# Patient Record
Sex: Female | Born: 1975 | Hispanic: No
Health system: Midwestern US, Community
[De-identification: ages and names within clinical notes are randomized; demographics above are authoritative.]

## PROBLEM LIST (undated history)

## (undated) ENCOUNTER — Emergency Department (HOSPITAL_COMMUNITY): Admission: EM | Payer: Medicaid Other

## (undated) DIAGNOSIS — F339 Major depressive disorder, recurrent, unspecified: Secondary | ICD-10-CM

## (undated) DIAGNOSIS — R040 Epistaxis: Secondary | ICD-10-CM

## (undated) DIAGNOSIS — E559 Vitamin D deficiency, unspecified: Secondary | ICD-10-CM

## (undated) DIAGNOSIS — R1010 Upper abdominal pain, unspecified: Secondary | ICD-10-CM

## (undated) DIAGNOSIS — B9681 Helicobacter pylori [H. pylori] as the cause of diseases classified elsewhere: Secondary | ICD-10-CM

## (undated) DIAGNOSIS — K2950 Unspecified chronic gastritis without bleeding: Secondary | ICD-10-CM

## (undated) DIAGNOSIS — R1011 Right upper quadrant pain: Secondary | ICD-10-CM

## (undated) DIAGNOSIS — K279 Peptic ulcer, site unspecified, unspecified as acute or chronic, without hemorrhage or perforation: Secondary | ICD-10-CM

## (undated) DIAGNOSIS — L309 Dermatitis, unspecified: Secondary | ICD-10-CM

## (undated) DIAGNOSIS — A159 Respiratory tuberculosis unspecified: Secondary | ICD-10-CM

---

## 2011-06-27 ENCOUNTER — Ambulatory Visit
Admission: RE | Admit: 2011-06-27 | Discharge: 2011-06-27 | Disposition: A | Discharge status: Home / Self Care | Payer: No Typology Code available for payment source | Source: Ambulatory Visit | Attending: Infectious Diseases | Admitting: Infectious Diseases

## 2011-06-27 ENCOUNTER — Other Ambulatory Visit: Payer: Self-pay | Admitting: Infectious Diseases

## 2011-06-27 DIAGNOSIS — R7611 Nonspecific reaction to tuberculin skin test without active tuberculosis: Secondary | ICD-10-CM

## 2011-09-08 ENCOUNTER — Encounter (HOSPITAL_COMMUNITY): Payer: Self-pay

## 2011-09-08 ENCOUNTER — Emergency Department (HOSPITAL_COMMUNITY)
Admission: EM | Admit: 2011-09-08 | Discharge: 2011-09-08 | Disposition: A | Discharge status: Home / Self Care | Payer: Medicaid Other | Source: Home / Self Care | Attending: Emergency Medicine | Admitting: Emergency Medicine

## 2011-09-08 DIAGNOSIS — R51 Headache: Secondary | ICD-10-CM

## 2011-09-08 MED ORDER — IBUPROFEN 600 MG PO TABS: 600.0000 mg | ORAL_TABLET | Freq: Four times a day (QID) | ORAL | Status: AC | PRN

## 2011-09-08 MED ORDER — BUTALBITAL-APAP-CAFFEINE 50-325-40 MG PO TABS: 1.0000 | ORAL_TABLET | Freq: Four times a day (QID) | ORAL | Status: AC | PRN

## 2011-09-08 NOTE — ED Notes (Signed)
Pt has headache and no appetite for one week.  Pt started unknown meds three months ago for tb.

## 2011-09-08 NOTE — Discharge Instructions (Signed)
Drink extra fluids. You need 2 liters of fluids a day. Take medication as written. You will need to see a neurologist if you do not get better. You also need to see a dentist for routine care. Below are some resources.  Go to www.goodrx.com to look up your medications. This will give you a list of where you can find your prescriptions at the most affordable prices.   RESOURCE GUIDE  Dental Problems  Look up DebtSupply.pl.asp for a schedule of the [facility]'s free dental clinics called [address] of [place]. They have clinics all around [place]. Get there early and be prepared to wait.   [facility] [address]  [place], [place] [address] [phone]  [facility] [address] [place], [place] [phone]  Patients with Medicaid: [facility] [id number]                                [phone] Phone:  [phone]                                                  Phone:  [phone]  If unable to pay or uninsured, contact:  [facility] or [facility]. to become qualified for the adult dental clinic.
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
## 2011-10-29 ENCOUNTER — Emergency Department (HOSPITAL_COMMUNITY): Payer: Medicaid Other

## 2011-10-29 ENCOUNTER — Emergency Department (HOSPITAL_COMMUNITY)
Admission: EM | Admit: 2011-10-29 | Discharge: 2011-10-29 | Disposition: A | Discharge status: Home / Self Care | Payer: Medicaid Other | Attending: Emergency Medicine | Admitting: Emergency Medicine

## 2011-10-29 ENCOUNTER — Encounter (HOSPITAL_COMMUNITY): Payer: Self-pay | Admitting: *Deleted

## 2011-10-29 DIAGNOSIS — R0789 Other chest pain: Secondary | ICD-10-CM

## 2011-10-29 DIAGNOSIS — S161XXA Strain of muscle, fascia and tendon at neck level, initial encounter: Secondary | ICD-10-CM

## 2011-10-29 DIAGNOSIS — R51 Headache: Secondary | ICD-10-CM | POA: Insufficient documentation

## 2011-10-29 DIAGNOSIS — M542 Cervicalgia: Secondary | ICD-10-CM | POA: Insufficient documentation

## 2011-10-29 DIAGNOSIS — S060X1A Concussion with loss of consciousness of 30 minutes or less, initial encounter: Secondary | ICD-10-CM | POA: Insufficient documentation

## 2011-10-29 DIAGNOSIS — S139XXA Sprain of joints and ligaments of unspecified parts of neck, initial encounter: Secondary | ICD-10-CM | POA: Insufficient documentation

## 2011-10-29 DIAGNOSIS — S060X9A Concussion with loss of consciousness of unspecified duration, initial encounter: Secondary | ICD-10-CM

## 2011-10-29 DIAGNOSIS — R112 Nausea with vomiting, unspecified: Secondary | ICD-10-CM | POA: Insufficient documentation

## 2011-10-29 DIAGNOSIS — R079 Chest pain, unspecified: Secondary | ICD-10-CM | POA: Insufficient documentation

## 2011-10-29 DIAGNOSIS — M79602 Pain in left arm: Secondary | ICD-10-CM

## 2011-10-29 DIAGNOSIS — M25519 Pain in unspecified shoulder: Secondary | ICD-10-CM | POA: Insufficient documentation

## 2011-10-29 DIAGNOSIS — R42 Dizziness and giddiness: Secondary | ICD-10-CM | POA: Insufficient documentation

## 2011-10-29 DIAGNOSIS — Z8611 Personal history of tuberculosis: Secondary | ICD-10-CM | POA: Insufficient documentation

## 2011-10-29 MED ORDER — HYDROCODONE-ACETAMINOPHEN 5-325 MG PO TABS: 1.0000 | ORAL_TABLET | ORAL | Status: AC | PRN

## 2011-10-29 MED ORDER — CYCLOBENZAPRINE HCL 5 MG PO TABS: 5.0000 mg | ORAL_TABLET | Freq: Three times a day (TID) | ORAL | Status: AC | PRN

## 2011-10-29 MED ORDER — CYCLOBENZAPRINE HCL 10 MG PO TABS
5.0000 mg | ORAL_TABLET | Freq: Once | ORAL | Status: AC
  Administered 2011-10-29: 5 mg via ORAL
  Filled 2011-10-29: qty 1

## 2011-10-29 MED ORDER — HYDROCODONE-ACETAMINOPHEN 5-325 MG PO TABS
1.0000 | ORAL_TABLET | Freq: Once | ORAL | Status: AC
Start: 2011-10-29 — End: 2011-10-29
  Administered 2011-10-29: 1 via ORAL
  Filled 2011-10-29: qty 1
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
## 2011-10-29 NOTE — ED Notes (Signed)
Headache sorethroat vomiting with some chest congestion since yesterday.  The pt speaks little english.  She speaks [place]
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
## 2011-10-29 NOTE — ED Notes (Signed)
Pt d/c home in nad. Pt and husband voiced understanding of d/c instructions and med admin. Instructed not to drive on meds
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
## 2011-10-30 NOTE — ED Provider Notes (Signed)
Medical screening examination/treatment/procedure(s) were performed by non-physician practitioner and as supervising physician I was immediately available for consultation/collaboration.   Joya Gaskins, MD 10/30/11 [phone]

## 2011-11-16 ENCOUNTER — Encounter (HOSPITAL_COMMUNITY): Payer: Self-pay | Admitting: Emergency Medicine

## 2011-11-16 ENCOUNTER — Emergency Department (HOSPITAL_COMMUNITY): Payer: Medicaid Other

## 2011-11-16 ENCOUNTER — Inpatient Hospital Stay (HOSPITAL_COMMUNITY): Payer: Medicaid Other

## 2011-11-16 ENCOUNTER — Inpatient Hospital Stay (HOSPITAL_COMMUNITY)
Admission: EM | Admit: 2011-11-16 | Discharge: 2011-11-17 | DRG: 149 | Disposition: A | Discharge status: Home / Self Care | Payer: Medicaid Other | Attending: Internal Medicine | Admitting: Internal Medicine

## 2011-11-16 DIAGNOSIS — G9389 Other specified disorders of brain: Secondary | ICD-10-CM | POA: Diagnosis present

## 2011-11-16 DIAGNOSIS — R7611 Nonspecific reaction to tuberculin skin test without active tuberculosis: Secondary | ICD-10-CM | POA: Diagnosis present

## 2011-11-16 DIAGNOSIS — E872 Acidosis, unspecified: Secondary | ICD-10-CM | POA: Diagnosis present

## 2011-11-16 DIAGNOSIS — Z79899 Other long term (current) drug therapy: Secondary | ICD-10-CM

## 2011-11-16 DIAGNOSIS — R630 Anorexia: Secondary | ICD-10-CM | POA: Diagnosis present

## 2011-11-16 DIAGNOSIS — R4182 Altered mental status, unspecified: Secondary | ICD-10-CM | POA: Diagnosis present

## 2011-11-16 DIAGNOSIS — R5383 Other fatigue: Secondary | ICD-10-CM | POA: Diagnosis present

## 2011-11-16 DIAGNOSIS — R51 Headache: Secondary | ICD-10-CM | POA: Diagnosis present

## 2011-11-16 DIAGNOSIS — T3691XA Poisoning by unspecified systemic antibiotic, accidental (unintentional), initial encounter: Secondary | ICD-10-CM

## 2011-11-16 DIAGNOSIS — R519 Headache, unspecified: Secondary | ICD-10-CM | POA: Diagnosis present

## 2011-11-16 DIAGNOSIS — T365X5A Adverse effect of aminoglycosides, initial encounter: Secondary | ICD-10-CM | POA: Diagnosis present

## 2011-11-16 DIAGNOSIS — IMO0002 Reserved for concepts with insufficient information to code with codable children: Secondary | ICD-10-CM

## 2011-11-16 DIAGNOSIS — E860 Dehydration: Secondary | ICD-10-CM

## 2011-11-16 DIAGNOSIS — R42 Dizziness and giddiness: Secondary | ICD-10-CM

## 2011-11-16 DIAGNOSIS — R809 Proteinuria, unspecified: Secondary | ICD-10-CM | POA: Diagnosis present

## 2011-11-16 HISTORY — DX: Respiratory tuberculosis unspecified: A15.9

## 2011-11-16 LAB — URINALYSIS, ROUTINE W REFLEX MICROSCOPIC
Glucose, UA: NEGATIVE mg/dL
Hgb urine dipstick: NEGATIVE
Specific Gravity, Urine: 1.038 — ABNORMAL HIGH (ref 1.005–1.030)

## 2011-11-16 LAB — POCT I-STAT, CHEM 8
BUN: 24 mg/dL — ABNORMAL HIGH (ref 6–23)
Calcium, Ion: 0.95 mmol/L — ABNORMAL LOW (ref 1.12–1.32)
Chloride: 103 mEq/L (ref 96–112)
Glucose, Bld: 98 mg/dL (ref 70–99)
TCO2: 28 mmol/L (ref 0–100)

## 2011-11-16 LAB — COMPREHENSIVE METABOLIC PANEL
AST: 17 U/L (ref 0–37)
Albumin: 3.6 g/dL (ref 3.5–5.2)
BUN: 21 mg/dL (ref 6–23)
Calcium: 9.1 mg/dL (ref 8.4–10.5)
Chloride: 103 mEq/L (ref 96–112)
Creatinine, Ser: 0.26 mg/dL — ABNORMAL LOW (ref 0.50–1.10)
Total Protein: 7 g/dL (ref 6.0–8.3)

## 2011-11-16 LAB — FIBRINOGEN: Fibrinogen: 226 mg/dL (ref 204–475)

## 2011-11-16 LAB — RAPID URINE DRUG SCREEN, HOSP PERFORMED
Barbiturates: POSITIVE — AB
Benzodiazepines: NOT DETECTED
Cocaine: NOT DETECTED
Tetrahydrocannabinol: NOT DETECTED

## 2011-11-16 LAB — DIFFERENTIAL
Basophils Absolute: 0 10*3/uL (ref 0.0–0.1)
Basophils Relative: 0 % (ref 0–1)
Eosinophils Absolute: 0 10*3/uL (ref 0.0–0.7)
Eosinophils Relative: 0 % (ref 0–5)
Monocytes Absolute: 1.1 10*3/uL — ABNORMAL HIGH (ref 0.1–1.0)
Neutro Abs: 10.8 10*3/uL — ABNORMAL HIGH (ref 1.7–7.7)

## 2011-11-16 LAB — PROTIME-INR
INR: 1.12 (ref 0.00–1.49)
Prothrombin Time: 13.8 seconds (ref 11.6–15.2)
Prothrombin Time: 14.6 seconds (ref 11.6–15.2)

## 2011-11-16 LAB — URINE MICROSCOPIC-ADD ON

## 2011-11-16 LAB — CBC
HCT: 41.3 % (ref 36.0–46.0)
MCH: 30.2 pg (ref 26.0–34.0)
MCHC: 33.7 g/dL (ref 30.0–36.0)
RDW: 12.3 % (ref 11.5–15.5)

## 2011-11-16 LAB — ACETAMINOPHEN LEVEL: Acetaminophen (Tylenol), Serum: <15 ug/mL (ref 10–30)

## 2011-11-16 LAB — LACTIC ACID, PLASMA: Lactic Acid, Venous: 1.2 mmol/L (ref 0.5–2.2)

## 2011-11-16 MED ORDER — NALOXONE HCL 0.4 MG/ML IJ SOLN
0.2000 mg | Freq: Once | INTRAMUSCULAR | Status: AC
  Administered 2011-11-16: 0.2 mg via INTRAVENOUS
  Filled 2011-11-16: qty 1

## 2011-11-16 MED ORDER — SODIUM CHLORIDE 0.9 % IJ SOLN
3.0000 mL | Freq: Two times a day (BID) | INTRAMUSCULAR | Status: DC
  Administered 2011-11-16 – 2011-11-17 (×2): 3 mL via INTRAVENOUS

## 2011-11-16 MED ORDER — FAMOTIDINE 20 MG PO TABS
20.0000 mg | ORAL_TABLET | ORAL | Status: AC
  Administered 2011-11-16: 20 mg via ORAL
  Filled 2011-11-16: qty 1

## 2011-11-16 MED ORDER — IBUPROFEN 800 MG PO TABS: 400.0000 mg | ORAL_TABLET | Freq: Four times a day (QID) | ORAL | Status: DC | PRN

## 2011-11-16 MED ORDER — ONDANSETRON HCL 4 MG PO TABS: 4.0000 mg | ORAL_TABLET | Freq: Four times a day (QID) | ORAL | Status: DC | PRN

## 2011-11-16 MED ORDER — HYDROCODONE-ACETAMINOPHEN 5-325 MG PO TABS: 1.0000 | ORAL_TABLET | ORAL | Status: DC | PRN

## 2011-11-16 MED ORDER — ONDANSETRON HCL 4 MG/2ML IJ SOLN: 4.0000 mg | Freq: Four times a day (QID) | INTRAMUSCULAR | Status: DC | PRN

## 2011-11-16 MED ORDER — GUAIFENESIN-DM 100-10 MG/5ML PO SYRP: 5.0000 mL | ORAL_SOLUTION | ORAL | Status: DC | PRN

## 2011-11-16 MED ORDER — SODIUM CHLORIDE 0.9 % IV SOLN
INTRAVENOUS | Status: DC
  Administered 2011-11-16: 15:00:00 via INTRAVENOUS

## 2011-11-16 MED ORDER — DIPHENHYDRAMINE HCL 50 MG/ML IJ SOLN
12.5000 mg | INTRAMUSCULAR | Status: AC
  Administered 2011-11-16: 12.5 mg via INTRAVENOUS
  Filled 2011-11-16: qty 1

## 2011-11-16 MED ORDER — DEXTROSE 5 % IV SOLN
1.0000 g | INTRAVENOUS | Status: DC
  Administered 2011-11-16: 1 g via INTRAVENOUS
  Filled 2011-11-16 (×2): qty 10

## 2011-11-16 MED ORDER — SODIUM CHLORIDE 0.9 % IV BOLUS (SEPSIS)
500.0000 mL | Freq: Once | INTRAVENOUS | Status: AC
  Administered 2011-11-16: 500 mL via INTRAVENOUS

## 2011-11-16 MED ORDER — ALBUTEROL SULFATE (5 MG/ML) 0.5% IN NEBU: 2.5000 mg | INHALATION_SOLUTION | RESPIRATORY_TRACT | Status: DC | PRN

## 2011-11-16 NOTE — Progress Notes (Signed)
Patient complaining of itching on the chest and arm area. Paged Md.
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
## 2011-11-16 NOTE — ED Notes (Signed)
MD at bedside. 
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
## 2011-11-16 NOTE — ED Notes (Signed)
Pt returned from CT by stretcher with tech.

## 2011-11-16 NOTE — H&P (Addendum)
[facility]                                                                                    Patient Demographics  Grace Manning, is a 36 y.o. female  CSN: [id number]  MRN: [id number]  DOB - 1975/09/20  Admit Date - 11/16/2011  Outpatient Primary MD for the patient is No primary provider on file.   With History of -  Past Medical History  Diagnosis Date  . TB (tuberculosis)       History reviewed. No pertinent past surgical history.  in for   Chief Complaint  Patient presents with  . Altered Mental Status     HPI  Grace Manning  is a 36 y.o. 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   In the ER patient's blood work was suggestive of mild leukocytosis, possible UTI, mildly elevated total bilirubin, starvation ketosis, clinical dehydration, some proteinuria, her urine drug screen was positive for opioids and benzodiazepines, he did say that for her headaches she had gone to the outpatient clinic and was given some medications, however she denies trying to hurt herself in any way or overdosing on any medication. Her chest x-ray was clear and her EKG was unremarkable. She denies any photophobia or neck stiffness.    Review of Systems    In addition to the HPI above,  No Fever-chills, No Headache, No changes  with Vision or hearing, No problems swallowing food or Liquids, No Chest pain, Cough or Shortness of Breath, No Abdominal pain, No Nausea or Vommitting, Bowel movements are regular, No Blood in stool or Urine, No dysuria, No new skin rashes or bruises, No new joints pains-aches,  No new weakness, tingling, numbness in any extremity, No recent weight gain or loss, No polyuria, polydypsia or polyphagia, No significant Mental Stressors.  A full 10 point Review of Systems was done, except as stated above, all other Review of Systems were negative.   Social History History  Substance Use Topics  . Smoking status: Never Smoker   . Smokeless tobacco: Not on file  . Alcohol Use: No     Family History No history of coronary artery disease  Prior to Admission medications   Medication Sig Start Date End Date Taking? Authorizing Provider  rifampin (RIFADIN) 300 MG capsule Take 300 mg by mouth 2 (two) times daily.   Yes Historical Provider, MD    No Known Allergies  Physical Exam  Vitals  Blood pressure 99/62, pulse 94, temperature 98 F (36.7 C), temperature source Rectal, resp. rate 20, last menstrual period 10/16/2011, SpO2 99.00%.   1. General Young Asian female lying in bed looking dehydrated and weak  2. Normal affect and insight, Not Suicidal or Homicidal, Awake Alert, Oriented *3.  3. No F.N deficits, ALL C.Nerves Intact, Strength 5/5 all 4 extremities, Sensation intact all 4 extremities, Plantars down going.  4. Ears and Eyes appear Normal, Conjunctivae clear, PERRLA. Moist Oral Mucosa.  5. Supple Neck, No JVD, No cervical lymphadenopathy appriciated, No Carotid Bruits.  6. Symmetrical Chest wall movement, Good air movement bilaterally, CTAB.  7. RRR, No Gallops, Rubs or Murmurs, No Parasternal Heave.  8. Positive Bowel Sounds, Abdomen Soft, Non tender, No organomegaly appriciated, No rebound -guarding or rigidity.  9.  No Cyanosis, reduced Skin Turgor, No Skin  Rash or Bruise.  10. Good muscle tone,  joints appear normal , no effusions, Normal ROM.  11. No Palpable Lymph Nodes in Neck or Axillae   12. Has Foley catheter in place with orange color urine in it.   Data Review  CBC  Lab 11/16/11 1041 11/16/11 1000  WBC -- 12.2*  HGB 15.3* 13.9  HCT 45.0 41.3  PLT -- 137*  MCV -- 89.8  MCH -- 30.2  MCHC -- 33.7  RDW -- 12.3  LYMPHSABS -- 0.4*  MONOABS -- 1.1*  EOSABS -- 0.0  BASOSABS -- 0.0  BANDABS -- --   ------------------------------------------------------------------------------------------------------------------  Chemistries   Lab 11/16/11 1041 11/16/11 1000  NA 141 138  K 3.8 3.5  CL 103 103  CO2 -- 27  GLUCOSE 98 100*  BUN 24* 21  CREATININE 0.70 0.26*  CALCIUM -- 9.1  MG -- --  AST -- 17  ALT -- 12  ALKPHOS -- 46  BILITOT -- 1.6*   ------------------------------------------------------------------------------------------------------------------ CrCl is unknown because there is no height on file for the current visit. ------------------------------------------------------------------------------------------------------------------ No results found for this basename: TSH,T4TOTAL,FREET3,T3FREE,THYROIDAB in the last 72 hours   Coagulation profile  Lab 11/16/11 1000  INR 1.04  PROTIME --   ------------------------------------------------------------------------------------------------------------------- No results found for this basename: DDIMER:2 in the last 72 hours -------------------------------------------------------------------------------------------------------------------  Cardiac Enzymes No results found for this basename: CK:3,CKMB:3,TROPONINI:3,MYOGLOBIN:3 in the last 168 hours ------------------------------------------------------------------------------------------------------------------ No components found with this basename:  POCBNP:3   ---------------------------------------------------------------------------------------------------------------  Urinalysis    Component Value Date/Time   COLORURINE RED* 11/16/2011 1148   APPEARANCEUR CLOUDY* 11/16/2011 1148   LABSPEC 1.038* 11/16/2011 1148   PHURINE 6.0 11/16/2011 1148   GLUCOSEU NEGATIVE 11/16/2011 1148   HGBUR NEGATIVE 11/16/2011 1148   BILIRUBINUR MODERATE* 11/16/2011 1148   KETONESUR 40* 11/16/2011 1148   PROTEINUR >300* 11/16/2011 1148   UROBILINOGEN 1.0 11/16/2011 1148   NITRITE POSITIVE* 11/16/2011 1148   LEUKOCYTESUR LARGE* 11/16/2011 1148     Imaging results:   Ct Head Wo Contrast  11/16/2011  *RADIOLOGY REPORT*  Clinical Data: Difficult to arouse.  Jaundice.  CT HEAD WITHOUT CONTRAST  Technique:  Contiguous axial images were obtained from the base of the skull through the vertex without contrast.  Comparison: None.  Findings: No mass effect, midline shift, or acute intracranial hemorrhage.  Ventricles system and extraaxial space are within normal limits. There are scattered supratentorial gray matter brain parenchymal calcifications without associated mass or hemorrhage. Mastoid air cells are clear.  Visualized paranasal sinuses are clear.  IMPRESSION: No acute intracranial pathology.  Scattered brain parenchymal calcifications in the gray  matter as described.  Differential diagnosis includes prior infectious process, prior hemorrhage with subsequent calcifications, multiple occult vascular malformations.   MRI can be performed to further delineate as clinically indicated.  Original Report Authenticated By: Donavan Burnet, M.D.   Dg Chest Port 1 View  11/16/2011  *RADIOLOGY REPORT*  Clinical Data: Altered mental status.  PORTABLE CHEST - 1 VIEW  Comparison: None  Findings: The cardiomediastinal silhouette is unremarkable. The lungs are clear. There is no evidence of focal airspace disease, pulmonary edema, suspicious pulmonary nodule/mass, pleural effusion, or  pneumothorax. No acute bony abnormalities are identified.  IMPRESSION: No evidence of active cardiopulmonary disease.  Original Report Authenticated By: Rosendo Gros, M.D.    My personal review of EKG: Rhythm NSR, Rate 85 /min, no Acute ST changes    Assessment & Plan   1. Generalized weakness, dizziness, fatigue, anorexia - with starvation ketosis, all likely due to rifampin toxicity, discussed her case with Dr. Darlina Sicilian infectious disease physician, at this time patient's rifampin will be held, she is already received this for 3 months and per Dr. Maurice March she does not need any further treatment, also I confirmed with him patient does not require any isolation as she was PPD positive and never had positive chest x-rays cough or night sweats etc, she will be hydrated with IV fluids, monitored on telemetry, she will get empiric antibiotics for possible UTI, we'll request PT to see her while she is in the hospital.    2. Chronic headaches- they are essentially unchanged, patient does not have any neck stiffness or photophobia, will give her some NSAIDs for headache on a when necessary basis then outpatient followup for chronic headaches.    3. Mildly elevated total bilirubin- will check direct and indirect fraction, likely due to rifampin, other liver enzymes are stable, will repeat CMP in the morning, patient does not have any right upper quadrant tenderness.    4.Protenuria - repeat UA in a few days, may need outpt Renal followup.    5.Non specific Ct brain findings will get MRI and follow.    DVT Prophylaxis  SCDs    AM Labs Ordered, also please review Full Orders  Admission, patients condition and plan of care including tests being ordered have been discussed with the patient and brother who indicate understanding and agree with the plan and Code Status.  Code Status Full  Condition Marinell Blight K M.D on 11/16/2011 at 2:31 PM  Between 7am to 7pm - Pager -  [phone]  After 7pm go to [email or web address] - password [email or web address]  And look for the night coverage person covering me after hours  [facility] Office  [phone]

## 2011-11-16 NOTE — ED Notes (Signed)
Portable chest xray in progress.

## 2011-11-16 NOTE — Progress Notes (Signed)
Unable to insert foley due to patient modesty and language barrier and active menses.

## 2011-11-16 NOTE — ED Notes (Signed)
[id number]<AV> Expected date:11/16/11<BR> Expected time: 9:18 AM<BR> Means of arrival:Ambulance<BR> Comments:<BR> AMS

## 2011-11-16 NOTE — ED Notes (Signed)
Patient transported to CT 

## 2011-11-17 ENCOUNTER — Inpatient Hospital Stay (HOSPITAL_COMMUNITY): Payer: Medicaid Other

## 2011-11-17 DIAGNOSIS — R42 Dizziness and giddiness: Secondary | ICD-10-CM

## 2011-11-17 DIAGNOSIS — E860 Dehydration: Secondary | ICD-10-CM

## 2011-11-17 DIAGNOSIS — T3691XA Poisoning by unspecified systemic antibiotic, accidental (unintentional), initial encounter: Secondary | ICD-10-CM

## 2011-11-17 LAB — URINALYSIS, ROUTINE W REFLEX MICROSCOPIC
Glucose, UA: NEGATIVE mg/dL
Ketones, ur: 15 mg/dL — AB
Protein, ur: NEGATIVE mg/dL
pH: 7 (ref 5.0–8.0)

## 2011-11-17 LAB — COMPREHENSIVE METABOLIC PANEL
Albumin: 2.8 g/dL — ABNORMAL LOW (ref 3.5–5.2)
Alkaline Phosphatase: 42 U/L (ref 39–117)
BUN: 12 mg/dL (ref 6–23)
Creatinine, Ser: 0.3 mg/dL — ABNORMAL LOW (ref 0.50–1.10)
GFR calc Af Amer: >90 mL/min (ref >90)
Glucose, Bld: 97 mg/dL (ref 70–99)
Total Bilirubin: 4.7 mg/dL — ABNORMAL HIGH (ref 0.3–1.2)
Total Protein: 5.6 g/dL — ABNORMAL LOW (ref 6.0–8.3)

## 2011-11-17 LAB — CBC
HCT: 34.7 % — ABNORMAL LOW (ref 36.0–46.0)
Hemoglobin: 11.6 g/dL — ABNORMAL LOW (ref 12.0–15.0)
WBC: 8.1 10*3/uL (ref 4.0–10.5)

## 2011-11-17 LAB — URINE CULTURE
Colony Count: NO GROWTH
Culture: NO GROWTH

## 2011-11-17 LAB — URINE MICROSCOPIC-ADD ON

## 2011-11-17 MED ORDER — CIPROFLOXACIN HCL 500 MG PO TABS
500.0000 mg | ORAL_TABLET | Freq: Two times a day (BID) | ORAL | Status: DC
  Administered 2011-11-17: 500 mg via ORAL
  Filled 2011-11-17 (×3): qty 1

## 2011-11-17 MED ORDER — CIPROFLOXACIN HCL 500 MG PO TABS: 500.0000 mg | ORAL_TABLET | Freq: Two times a day (BID) | ORAL | Status: AC

## 2011-11-17 MED ORDER — SODIUM CHLORIDE 0.9 % IV SOLN
INTRAVENOUS | Status: DC
  Administered 2011-11-17: 11:00:00 via INTRAVENOUS

## 2011-11-17 MED ORDER — SODIUM CHLORIDE 0.9 % IV SOLN: INTRAVENOUS | Status: AC

## 2011-11-17 MED ORDER — DIPHENHYDRAMINE HCL 50 MG/ML IJ SOLN
25.0000 mg | Freq: Once | INTRAMUSCULAR | Status: AC
  Administered 2011-11-17: 25 mg via INTRAVENOUS
  Filled 2011-11-17: qty 1

## 2011-11-17 NOTE — Progress Notes (Signed)
Patient sitting comfortably in the chair looking much better.  States she is ready to go home.

## 2011-11-17 NOTE — Progress Notes (Signed)
CM not available today.  Spoke with Selena Batten with CM (oncall).  Suggested that patient call [facility] for free services available.  Provided patient [facility] phone number.

## 2011-11-17 NOTE — Progress Notes (Signed)
Patient has not urinated since inserting foley.  Will scan bladder.

## 2011-11-17 NOTE — Evaluation (Signed)
Physical Therapy Evaluation Patient Details Name: Grace Manning MRN: [id number] DOB: June 02, 1975 Today's Date: 11/17/2011 Time: 1335-1400 PT Time Calculation (min): 25 min  PT Assessment / Plan / Recommendation Clinical Impression  Pt presents with dizziness, dehydration and weakness.  Tolerated ambulation in hallway very well with no AD at normal gait speed.  BP remained stable with pt only having minor c/o dizziness initially.  Pt Mod I/independent with all mobility and will not require follow up therapy in acute setting or at D/C.  PT to sign off on pt.  She did state that she would like to go home and wanted to speak with MD about this.  RN notified.     PT Assessment  Patent does not need any further PT services    Follow Up Recommendations  No PT follow up    Barriers to Discharge        lEquipment Recommendations       Recommendations for Other Services     Frequency      Precautions / Restrictions Precautions Precautions: None Restrictions Weight Bearing Restrictions: No   Pertinent Vitals/Pain No pain      Mobility  Bed Mobility Bed Mobility: Supine to Sit Supine to Sit: 7: Independent Transfers Transfers: Sit to Stand;Stand to Sit Sit to Stand: 6: Modified independent (Device/Increase time);From bed;With upper extremity assist Stand to Sit: 6: Modified independent (Device/Increase time);With armrests;To chair/3-in-1;With upper extremity assist Details for Transfer Assistance: somewhat increased time Ambulation/Gait Ambulation/Gait Assistance: 7: Independent Ambulation Distance (Feet): 300 Feet Assistive device: None Gait Pattern: Within Functional Limits Gait velocity: WFL Stairs: No Wheelchair Mobility Wheelchair Mobility: No    Exercises     PT Diagnosis:    PT Problem List:   PT Treatment Interventions:     PT Goals    Visit Information  Last PT Received On: 11/17/11 Assistance Needed: +1    Subjective Data  Subjective: I want to go  home Patient Stated Goal: to return home   Prior Functioning  Home Living Additional Comments: Pt/PT language barrier and unable to obtain full history, however was able to mention that there is someone home with her.  Noted there was also friend/family in room with her at time of eval who was also unable to speak [place].  Communication Communication: Prefers language other than English (Pt from [place])    Cognition  Overall Cognitive Status: Appears within functional limits for tasks assessed/performed Arousal/Alertness: Awake/alert Orientation Level: Appears intact for tasks assessed Behavior During Session: [facility] for tasks performed Cognition - Other Comments: Difficult to assess cognition due to language barrier, but [facility] for tasks assessed.     Extremity/Trunk Assessment Right Lower Extremity Assessment RLE ROM/Strength/Tone: WFL for tasks assessed RLE Coordination: WFL - gross motor Left Lower Extremity Assessment LLE ROM/Strength/Tone: WFL for tasks assessed LLE Sensation: WFL - Light Touch LLE Coordination: WFL - gross motor Trunk Assessment Trunk Assessment: Normal   Balance    End of Session PT - End of Session Activity Tolerance: Patient tolerated treatment well Patient left: in chair;with call bell/phone within reach;with family/visitor present   Page, Meribeth Mattes 11/17/2011, 2:20 PM

## 2011-11-17 NOTE — Discharge Summary (Signed)
Grace Manning                                                                                   Grace Manning, is a 36 y.o. female  DOB 1976-05-11  MRN [id number].  Admission date:  11/16/2011  Discharge Date:  11/17/2011  Primary MD  No primary provider on file.  Admitting Physician  Leroy Sea, MD  Admission Diagnosis  Altered mental status [780.97] ALTERED MENTAL STATE  Discharge Diagnosis     Principal Problem:  *Dizziness Active Problems:  Fatigue  Generalized headaches  Anorexia  PPD positive      Past Medical History  Diagnosis Date  . TB (tuberculosis)     History reviewed. No pertinent past surgical history.   Hospital Course See H&P, Labs, Consult and Test reports for all details in brief, patient was admitted for Gen. 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 Ova and Parasite results to be followed.   She is now symptom free and wants to go home.   PCP to follow Hep panel and Stool Ova and Parasite results.   Consults  ID-Dr Maurice March  Significant Tests:  See full reports for all details     Dg Chest 2 View  11/16/2011  *RADIOLOGY REPORT*  Clinical Data: Fever, positive PPD, altered mental status and dizziness.  CHEST - 2 VIEW  Comparison: Film earlier this date  Findings: The cardiomediastinal silhouette is unremarkable. The lungs are clear. There is no evidence of focal  airspace disease, pulmonary edema, suspicious pulmonary nodule/mass, pleural effusion, or pneumothorax. No acute bony abnormalities are identified.  IMPRESSION: No evidence of active cardiopulmonary disease.  Original Report Authenticated By: Rosendo Gros, M.D.   Ct Head Wo Contrast  11/16/2011  *RADIOLOGY REPORT*  Clinical Data: Difficult to arouse.  Jaundice.  CT HEAD WITHOUT CONTRAST  Technique:  Contiguous axial images were obtained from the base of the skull through the vertex without contrast.  Comparison: None.  Findings: No mass effect, midline shift, or acute intracranial hemorrhage.  Ventricles system and extraaxial space are within normal limits. There are scattered supratentorial gray matter brain parenchymal calcifications without associated mass or hemorrhage. Mastoid air cells are clear.  Visualized paranasal sinuses are clear.  IMPRESSION: No acute intracranial pathology.  Scattered brain parenchymal calcifications in the gray matter as described.  Differential diagnosis includes prior infectious process, prior hemorrhage with subsequent calcifications, multiple occult vascular malformations.   MRI can be performed to further delineate as clinically indicated.  Original Report Authenticated By: Donavan Burnet, M.D.   Mr Brain  Wo Contrast  11/17/2011  *RADIOLOGY REPORT*  Clinical Data: Evaluate brain calcifications on CT.  History of TB  MRI HEAD WITHOUT CONTRAST  Technique:  Multiplanar, multiecho pulse sequences of the brain and surrounding structures were obtained according to standard protocol without intravenous contrast.  Comparison: CT head 11/16/2011  Findings: Small calcifications in the cerebral hemispheres on CT are difficult to see on MRI.  No surrounding edema is present in these areas on MRI.  No associated mass lesion is present.  Diffusion weighted imaging is negative for acute infarct.  No significant chronic ischemia is present.  Brainstem and cerebellum are normal.  Ventricle  size is normal.  Minimal mucosal thickening in the paranasal sinuses.  IMPRESSION: Small brain calcifications on CT are not associated with edema or mass on MRI.  These may be related to chronic infection but they do not appear to be active.  Original Report Authenticated By: Camelia Phenes, M.D.   US Abdomen Complete  11/17/2011  *RADIOLOGY REPORT*  Clinical Data:  Hyperbilirubinemia.  History of tuberculosis.  COMPLETE ABDOMINAL ULTRASOUND  Comparison:  None.  Findings:  Gallbladder:  No shadowing gallstones or echogenic sludge.  No gallbladder wall thickening or pericholecystic fluid.  Negative sonographic Murphy's sign according to the ultrasound technologist. Trace fluid between the liver and gallbladder is not felt clinically significant in the absence of gallbladder wall thickening.  Common bile duct:  Normal in caliber with maximum diameter approximating 4 mm.  Liver:  Normal size and echotexture without focal parenchymal abnormality.  Patent portal vein with hepatopetal flow.  IVC:  Patent.  Pancreas:  Normal size and echotexture without focal parenchymal abnormality.  Spleen:  Normal size and echotexture without focal parenchymal abnormality.  Right Kidney:  No hydronephrosis.  Well-preserved cortex.  No shadowing calculi.  Normal size and parenchymal echotexture without focal abnormalities.  Approximately 11.1 cm in length.  Left Kidney:  No hydronephrosis.  Well-preserved cortex.  No shadowing calculi.  Normal size and parenchymal echotexture without focal abnormalities.  Approximately 10.8 cm in length.  Abdominal aorta:  Normal in caliber throughout its visualized course in the abdomen without significant atherosclerosis.  IMPRESSION: Normal examination.  Original Report Authenticated By: Arnell Sieving, M.D.   Dg Chest Port 1 View  11/16/2011  *RADIOLOGY REPORT*  Clinical Data: Altered mental status.  PORTABLE CHEST - 1 VIEW  Comparison: None  Findings: The cardiomediastinal silhouette is  unremarkable. The lungs are clear. There is no evidence of focal airspace disease, pulmonary edema, suspicious pulmonary nodule/mass, pleural effusion, or pneumothorax. No acute bony abnormalities are identified.  IMPRESSION: No evidence of active cardiopulmonary disease.  Original Report Authenticated By: Rosendo Gros, M.D.     Today   Subjective:   Rashunda Grzelak today has no headache,no chest abdominal pain,no new weakness tingling or numbness, feels much better wants to go home today.    Objective:   Blood pressure 101/65, pulse 72, temperature 98.2 F (36.8 C), temperature source Oral, resp. rate 16, height 5' (1.524 m), weight 52.1 kg (114 lb 13.8 oz), last menstrual period 10/16/2011, SpO2 99.00%.  Intake/Output Summary (Last 24 hours) at 11/17/11 1712 Last data filed at 11/17/11 1639  Gross per 24 hour  Intake    600 ml  Output    850 ml  Net   -250 ml    Exam Awake Alert, Oriented *3, No new F.N deficits, Normal affect [place].AT,PERRAL, mild icterus Supple Neck,No JVD, No cervical lymphadenopathy appriciated.  Symmetrical Chest wall movement,  Good air movement bilaterally, CTAB RRR,No Gallops,Rubs or new Murmurs, No Parasternal Heave +ve B.Sounds, Abd Soft, Non tender, No organomegaly appriciated, No rebound -guarding or rigidity. No Cyanosis, Clubbing or edema, No new Rash or bruise  Data Review      Recent Results (from the past 240 hour(s))  CULTURE, BLOOD (ROUTINE X 2)     Status: Normal (Preliminary result)   Collection Time   11/16/11 11:05 AM      Component Value Range Status Comment   Specimen Description BLOOD RIGHT ARM   Final    Special Requests BOTTLES DRAWN AEROBIC AND ANAEROBIC 5 CC EACH   Final    Culture  Setup Time [id number]   Final    Culture     Final    Value:        BLOOD CULTURE RECEIVED NO GROWTH TO DATE CULTURE WILL BE HELD FOR 5 DAYS BEFORE ISSUING A FINAL NEGATIVE REPORT   Report Status PENDING   Incomplete   CULTURE, BLOOD (ROUTINE X 2)      Status: Normal (Preliminary result)   Collection Time   11/16/11 11:12 AM      Component Value Range Status Comment   Specimen Description BLOOD LEFT ARM   Final    Special Requests BOTTLES DRAWN AEROBIC AND ANAEROBIC 5 CC EACH   Final    Culture  Setup Time [id number]   Final    Culture     Final    Value:        BLOOD CULTURE RECEIVED NO GROWTH TO DATE CULTURE WILL BE HELD FOR 5 DAYS BEFORE ISSUING A FINAL NEGATIVE REPORT   Report Status PENDING   Incomplete   URINE CULTURE     Status: Normal   Collection Time   11/16/11 11:48 AM      Component Value Range Status Comment   Specimen Description URINE, CATHETERIZED   Final    Special Requests NONE   Final    Culture  Setup Time [id number]   Final    Colony Count NO GROWTH   Final    Culture NO GROWTH   Final    Report Status 11/17/2011 FINAL   Final      CBC w Diff: Lab Results  Component Value Date   WBC 8.1 11/17/2011   HGB 11.6* 11/17/2011   HCT 34.7* 11/17/2011   PLT 123* 11/17/2011   LYMPHOPCT 3* 11/16/2011   MONOPCT 9 11/16/2011   EOSPCT 0 11/16/2011   BASOPCT 0 11/16/2011    CMP: Lab Results  Component Value Date   NA 138 11/17/2011   K 3.8 11/17/2011   CL 106 11/17/2011   CO2 26 11/17/2011   BUN 12 11/17/2011   CREATININE 0.30* 11/17/2011   PROT 5.6* 11/17/2011   ALBUMIN 2.8* 11/17/2011   BILITOT 4.7* 11/17/2011   ALKPHOS 42 11/17/2011   AST 15 11/17/2011   ALT 10 11/17/2011  .   Discharge Instructions     Follow with Primary MD  in 7 days , follow your Hepatitis Panel results and Stool Ova and Parasite results.  Get CBC, CMP, checked 7 days by Primary MD and again as instructed by your Primary MD. Get a 2 view Chest X ray done next visit if you had Pneumonia of Lung problems at the Hospital.  Get Medicines reviewed and adjusted.  Please request your Prim.MD to go over all Hospital Tests and Procedure/Radiological results at the follow up, please get all Hospital records sent to  your Prim MD by signing hospital  release before you go home.  Activity: As tolerated with Full fall precautions use walker/cane & assistance as needed  Diet: Heart Healthy  Disposition Home    If you experience worsening of your admission symptoms, develop shortness of breath, life threatening emergency, suicidal or homicidal thoughts you must seek medical attention immediately by calling 911 or calling your MD immediately  if symptoms less severe.  You Must read complete instructions/literature along with all the possible adverse reactions/side effects for all the Medicines you take and that have been prescribed to you. Take any new Medicines after you have completely understood and accpet all the possible adverse reactions/side effects.   Do not drive if your were admitted for syncope or siezures until you have seen by Primary MD or a Neurologist and advised to drive.  Do not drive when taking Pain medications.   Do not take more than prescribed Pain, Sleep and Anxiety Medications  Special Instructions: If you have smoked or chewed Tobacco  in the last 2 yrs please stop smoking, stop any regular Alcohol  and or any Recreational drug use.  Wear Seat belts while driving. Follow-up Information    Follow up with Your Primary Care MD. Schedule an appointment as soon as possible for a visit in 1 day.         Discharge Medications    Naiyah, Klostermann  Home Medication Instructions ZOX:[id number]   Printed on:11/17/11 1712  Medication Information                    ciprofloxacin (CIPRO) 500 MG tablet Take 1 tablet (500 mg total) by mouth 2 (two) times daily.              Total Time in preparing paper work, data evaluation and todays exam - 35 minutes  Leroy Sea M.D on 11/17/2011 at 5:12 PM  [facility]  [phone]

## 2011-11-17 NOTE — Progress Notes (Signed)
[facility]                                                                                Patient Demographics  Grace Manning, is a 36 y.o. female  RUE:[id number]  JYN:[id number]  DOB - 01/15/1976  Admit date - 11/16/2011  Admitting Physician Leroy Sea, MD  Outpatient Primary MD for the patient is No primary provider on file.  LOS - 1   Chief Complaint  Patient presents with  . Altered Mental Status        Subjective:   Grace Manning today has, No headache, No chest pain, No abdominal pain - No Nausea, No new weakness tingling or numbness, No Cough - SOB.    Objective:   Filed Vitals:   11/16/11 1657 11/16/11 1752 11/16/11 2130 11/17/11 0535  BP: 90/60 103/70 92/62 93/61   Pulse:  99 89 70  Temp:  99.2 F (37.3 C) 97.9 F (36.6 C) 97.7 F (36.5 C)  TempSrc:  Axillary Oral Oral  Resp: 14 20 16 16   Height:  5' (1.524 m)    Weight:  52.1 kg (114 lb 13.8 oz)    SpO2: 98% 97% 97% 98%    Wt Readings from Last 3 Encounters:  11/16/11 52.1 kg (114 lb 13.8 oz)     Intake/Output Summary (Last 24 hours) at 11/17/11 1046 Last data filed at 11/17/11 0100  Gross per 24 hour  Intake    240 ml  Output    600 ml  Net   -360 ml    Exam Awake Alert, Oriented *3, No new F.N deficits, Normal affect [place].AT,PERRAL, +ve icterus  Supple Neck,No JVD, No cervical lymphadenopathy appriciated.  Symmetrical Chest wall movement, Good air movement bilaterally, CTAB RRR,No Gallops,Rubs or new Murmurs, No Parasternal Heave +ve B.Sounds, Abd Soft, Non tender, No organomegaly appriciated, No rebound -guarding or rigidity. No Cyanosis, Clubbing or edema, No new Rash or bruise , L index finger ring ++ tight with some distal swelling  Data Review  CBC  Lab 11/17/11 0525 11/16/11 1041 11/16/11 1000  WBC 8.1 -- 12.2*  HGB 11.6* 15.3* 13.9  HCT 34.7* 45.0 41.3  PLT 123* -- 137*  MCV 89.7 -- 89.8  MCH 30.0 -- 30.2  MCHC 33.4 -- 33.7  RDW 12.5 -- 12.3  LYMPHSABS  -- -- 0.4*  MONOABS -- -- 1.1*  EOSABS -- -- 0.0  BASOSABS -- -- 0.0  BANDABS -- -- --    Chemistries   Lab 11/17/11 0525 11/16/11 1516 11/16/11 1041 11/16/11 1000  NA 138 -- 141 138  K 3.8 -- 3.8 3.5  CL 106 -- 103 103  CO2 26 -- -- 27  GLUCOSE 97 -- 98 100*  BUN 12 -- 24* 21  CREATININE 0.30* -- 0.70 0.26*  CALCIUM 8.2* -- -- 9.1  MG -- -- -- --  AST 15 -- -- 17  ALT 10 -- -- 12  ALKPHOS 42 -- -- 46  BILITOT 4.7* 2.5* -- 1.6*   ------------------------------------------------------------------------------------------------------------------ estimated creatinine clearance is 69.2 ml/min (by C-G formula based on Cr of 0.3). ------------------------------------------------------------------------------------------------------------------ No results found for this basename: HGBA1C:2 in the last 72 hours ------------------------------------------------------------------------------------------------------------------  No results found for this basename: CHOL:2,HDL:2,LDLCALC:2,TRIG:2,CHOLHDL:2,LDLDIRECT:2 in the last 72 hours ------------------------------------------------------------------------------------------------------------------  [facility] 11/16/11 1823  TSH 3.159  T4TOTAL --  T3FREE --  THYROIDAB --   ------------------------------------------------------------------------------------------------------------------ No results found for this basename: VITAMINB12:2,FOLATE:2,FERRITIN:2,TIBC:2,IRON:2,RETICCTPCT:2 in the last 72 hours  Coagulation profile  Lab 11/16/11 1823 11/16/11 1000  INR 1.12 1.04  PROTIME -- --    No results found for this basename: DDIMER:2 in the last 72 hours  Cardiac Enzymes No results found for this basename: CK:3,CKMB:3,TROPONINI:3,MYOGLOBIN:3 in the last 168 hours ------------------------------------------------------------------------------------------------------------------ No components found with this basename: POCBNP:3  Micro  Results Recent Results (from the past 240 hour(s))  CULTURE, BLOOD (ROUTINE X 2)     Status: Normal (Preliminary result)   Collection Time   11/16/11 11:05 AM      Component Value Range Status Comment   Specimen Description BLOOD RIGHT ARM   Final    Special Requests BOTTLES DRAWN AEROBIC AND ANAEROBIC 5 CC EACH   Final    Culture  Setup Time [id number]   Final    Culture     Final    Value:        BLOOD CULTURE RECEIVED NO GROWTH TO DATE CULTURE WILL BE HELD FOR 5 DAYS BEFORE ISSUING A FINAL NEGATIVE REPORT   Report Status PENDING   Incomplete   CULTURE, BLOOD (ROUTINE X 2)     Status: Normal (Preliminary result)   Collection Time   11/16/11 11:12 AM      Component Value Range Status Comment   Specimen Description BLOOD LEFT ARM   Final    Special Requests BOTTLES DRAWN AEROBIC AND ANAEROBIC 5 CC EACH   Final    Culture  Setup Time [id number]   Final    Culture     Final    Value:        BLOOD CULTURE RECEIVED NO GROWTH TO DATE CULTURE WILL BE HELD FOR 5 DAYS BEFORE ISSUING A FINAL NEGATIVE REPORT   Report Status PENDING   Incomplete     Radiology Reports Dg Chest 2 View  11/16/2011  *RADIOLOGY REPORT*  Clinical Data: Fever, positive PPD, altered mental status and dizziness.  CHEST - 2 VIEW  Comparison: Film earlier this date  Findings: The cardiomediastinal silhouette is unremarkable. The lungs are clear. There is no evidence of focal airspace disease, pulmonary edema, suspicious pulmonary nodule/mass, pleural effusion, or pneumothorax. No acute bony abnormalities are identified.  IMPRESSION: No evidence of active cardiopulmonary disease.  Original Report Authenticated By: Rosendo Gros, M.D.   Ct Head Wo Contrast  11/16/2011  *RADIOLOGY REPORT*  Clinical Data: Difficult to arouse.  Jaundice.  CT HEAD WITHOUT CONTRAST  Technique:  Contiguous axial images were obtained from the base of the skull through the vertex without contrast.  Comparison: None.  Findings: No mass effect, midline  shift, or acute intracranial hemorrhage.  Ventricles system and extraaxial space are within normal limits. There are scattered supratentorial gray matter brain parenchymal calcifications without associated mass or hemorrhage. Mastoid air cells are clear.  Visualized paranasal sinuses are clear.  IMPRESSION: No acute intracranial pathology.  Scattered brain parenchymal calcifications in the gray matter as described.  Differential diagnosis includes prior infectious process, prior hemorrhage with subsequent calcifications, multiple occult vascular malformations.   MRI can be performed to further delineate as clinically indicated.  Original Report Authenticated By: Donavan Burnet, M.D.   Mr Brain Wo Contrast  11/17/2011  *RADIOLOGY REPORT*  Clinical Data: Evaluate brain calcifications on  CT.  History of TB  MRI HEAD WITHOUT CONTRAST  Technique:  Multiplanar, multiecho pulse sequences of the brain and surrounding structures were obtained according to standard protocol without intravenous contrast.  Comparison: CT head 11/16/2011  Findings: Small calcifications in the cerebral hemispheres on CT are difficult to see on MRI.  No surrounding edema is present in these areas on MRI.  No associated mass lesion is present.  Diffusion weighted imaging is negative for acute infarct.  No significant chronic ischemia is present.  Brainstem and cerebellum are normal.  Ventricle size is normal.  Minimal mucosal thickening in the paranasal sinuses.  IMPRESSION: Small brain calcifications on CT are not associated with edema or mass on MRI.  These may be related to chronic infection but they do not appear to be active.  Original Report Authenticated By: Camelia Phenes, M.D.   US Abdomen Complete  11/17/2011  *RADIOLOGY REPORT*  Clinical Data:  Hyperbilirubinemia.  History of tuberculosis.  COMPLETE ABDOMINAL ULTRASOUND  Comparison:  None.  Findings:  Gallbladder:  No shadowing gallstones or echogenic sludge.  No gallbladder wall  thickening or pericholecystic fluid.  Negative sonographic Murphy's sign according to the ultrasound technologist. Trace fluid between the liver and gallbladder is not felt clinically significant in the absence of gallbladder wall thickening.  Common bile duct:  Normal in caliber with maximum diameter approximating 4 mm.  Liver:  Normal size and echotexture without focal parenchymal abnormality.  Patent portal vein with hepatopetal flow.  IVC:  Patent.  Pancreas:  Normal size and echotexture without focal parenchymal abnormality.  Spleen:  Normal size and echotexture without focal parenchymal abnormality.  Right Kidney:  No hydronephrosis.  Well-preserved cortex.  No shadowing calculi.  Normal size and parenchymal echotexture without focal abnormalities.  Approximately 11.1 cm in length.  Left Kidney:  No hydronephrosis.  Well-preserved cortex.  No shadowing calculi.  Normal size and parenchymal echotexture without focal abnormalities.  Approximately 10.8 cm in length.  Abdominal aorta:  Normal in caliber throughout its visualized course in the abdomen without significant atherosclerosis.  IMPRESSION: Normal examination.  Original Report Authenticated By: Arnell Sieving, M.D.   Dg Chest Port 1 View  11/16/2011  *RADIOLOGY REPORT*  Clinical Data: Altered mental status.  PORTABLE CHEST - 1 VIEW  Comparison: None  Findings: The cardiomediastinal silhouette is unremarkable. The lungs are clear. There is no evidence of focal airspace disease, pulmonary edema, suspicious pulmonary nodule/mass, pleural effusion, or pneumothorax. No acute bony abnormalities are identified.  IMPRESSION: No evidence of active cardiopulmonary disease.  Original Report Authenticated By: Rosendo Gros, M.D.    Scheduled Meds:   . ciprofloxacin  500 mg Oral BID  . diphenhydrAMINE  12.5 mg Intravenous NOW  . diphenhydrAMINE  25 mg Intravenous Once  . famotidine  20 mg Oral NOW  . naloxone (NARCAN) injection  0.2 mg Intravenous Once   . sodium chloride  500 mL Intravenous Once  . sodium chloride  3 mL Intravenous Q12H  . DISCONTD: cefTRIAXone (ROCEPHIN)  IV  1 g Intravenous Q24H   Continuous Infusions:   . sodium chloride 75 mL/hr at 11/17/11 1100  . DISCONTD: sodium chloride 125 mL/hr at 11/17/11 0700  . DISCONTD: sodium chloride 75 mL/hr at 11/17/11 1032   PRN Meds:.albuterol, guaiFENesin-dextromethorphan, HYDROcodone-acetaminophen, ondansetron (ZOFRAN) IV, ondansetron, DISCONTD: ibuprofen  Assessment & Plan    1. Generalized weakness, dizziness, fatigue, anorexia - with starvation ketosis, all likely due to rifampin toxicity - she feels better with IVF, Rifampin  on Hold, PT to see, have D/W Dr Maurice March ID who will see the pt for any further input ( non specific MRI findings ? Taenia solium, future Rx for +ve PPD).    2. Chronic headaches- they are essentially unchanged, patient does not have any neck stiffness or photophobia-  Feels better, MRI noted, D/W Dr Maurice March who will see the pt, outpt Neuro follow, stool Ova and Parasite ordered for ? Taenia solium. .     3. Mildly elevated total bilirubin- D/W GI Dr Matthias Hughs likelt Gilbert's syndrome TB high to to illness now, [place] stable, Indirect fraction is high, DIC workup -ve.     4.Protenuria - repeat UA , may need outpt Renal followup.       DVT Prophylaxis  SCD  Procedures CT and MRI Brain  Consults ID   Susa Raring K M.D on 11/17/2011 at 10:46 AM  Between 7am to 7pm - Pager - [phone]  After 7pm go to [email or web address] - password [email or web address]  And look for the night coverage person covering for me after hours  [facility] Office  [phone]

## 2011-11-17 NOTE — Discharge Instructions (Signed)
Follow with Primary MD  in 7 days , follow your Hepatitis Panel results & Stool Ova and Parasite results.  Get CBC, CMP, checked 7 days by Primary MD and again as instructed by your Primary MD. Get a 2 view Chest X ray done next visit if you had Pneumonia of Lung problems at the Hospital.  Get Medicines reviewed and adjusted.  Please request your Prim.MD to go over all Hospital Tests and Procedure/Radiological results at the follow up, please get all Hospital records sent to your Prim MD by signing hospital release before you go home.  Activity: As tolerated with Full fall precautions use walker/cane & assistance as needed  Diet: Heart Healthy  Disposition Home    If you experience worsening of your admission symptoms, develop shortness of breath, life threatening emergency, suicidal or homicidal thoughts you must seek medical attention immediately by calling 911 or calling your MD immediately  if symptoms less severe.  You Must read complete instructions/literature along with all the possible adverse reactions/side effects for all the Medicines you take and that have been prescribed to you. Take any new Medicines after you have completely understood and accpet all the possible adverse reactions/side effects.   Do not drive if your were admitted for syncope or siezures until you have seen by Primary MD or a Neurologist and advised to drive.  Do not drive when taking Pain medications.   Do not take more than prescribed Pain, Sleep and Anxiety Medications  Special Instructions: If you have smoked or chewed Tobacco  in the last 2 yrs please stop smoking, stop any regular Alcohol  and or any Recreational drug use.  Wear Seat belts while driving.

## 2011-11-18 LAB — HEPATITIS PANEL, ACUTE
HCV Ab: NEGATIVE
Hepatitis B Surface Ag: NEGATIVE

## 2011-11-19 ENCOUNTER — Encounter (HOSPITAL_COMMUNITY): Payer: Self-pay | Admitting: *Deleted

## 2011-11-22 LAB — CULTURE, BLOOD (ROUTINE X 2)
Culture  Setup Time: 201306221725
Culture: NO GROWTH
Culture: NO GROWTH

## 2012-09-04 ENCOUNTER — Encounter (HOSPITAL_COMMUNITY): Payer: Self-pay | Admitting: *Deleted

## 2012-09-04 ENCOUNTER — Emergency Department (HOSPITAL_COMMUNITY)
Admission: EM | Admit: 2012-09-04 | Discharge: 2012-09-05 | Disposition: A | Discharge status: Home / Self Care | Payer: Medicaid Other | Attending: Emergency Medicine | Admitting: Emergency Medicine

## 2012-09-04 DIAGNOSIS — K0889 Other specified disorders of teeth and supporting structures: Secondary | ICD-10-CM

## 2012-09-04 DIAGNOSIS — K089 Disorder of teeth and supporting structures, unspecified: Secondary | ICD-10-CM | POA: Insufficient documentation

## 2012-09-04 DIAGNOSIS — Z8611 Personal history of tuberculosis: Secondary | ICD-10-CM | POA: Insufficient documentation

## 2012-09-04 DIAGNOSIS — K029 Dental caries, unspecified: Secondary | ICD-10-CM | POA: Insufficient documentation

## 2012-09-04 NOTE — ED Notes (Signed)
Toothache - left side, lower, back tooth. Started today.

## 2012-09-05 MED ORDER — OXYCODONE-ACETAMINOPHEN 5-325 MG PO TABS
2.0000 | ORAL_TABLET | Freq: Once | ORAL | Status: AC
  Administered 2012-09-05: 2 via ORAL
  Filled 2012-09-05: qty 2

## 2012-09-05 MED ORDER — PENICILLIN V POTASSIUM 500 MG PO TABS: 500.0000 mg | ORAL_TABLET | Freq: Three times a day (TID) | ORAL | Status: AC

## 2012-09-05 MED ORDER — ONDANSETRON 4 MG PO TBDP
4.0000 mg | ORAL_TABLET | Freq: Once | ORAL | Status: DC
  Filled 2012-09-05: qty 1

## 2012-09-05 MED ORDER — HYDROCODONE-ACETAMINOPHEN 5-325 MG PO TABS: 1.0000 | ORAL_TABLET | ORAL | Status: DC | PRN

## 2012-09-05 NOTE — ED Provider Notes (Signed)
History     CSN: [id number]  Arrival date & time 09/04/12  2338   First MD Initiated Contact with Patient 09/05/12 0004      Chief Complaint  Patient presents with  . Dental Pain    (Consider location/radiation/quality/duration/timing/severity/associated sxs/prior treatment) Patient is a 37 y.o. female presenting with tooth pain. The history is provided by the patient. The history is limited by a language barrier. A language interpreter was used.  Dental PainThe primary symptoms include mouth pain. Primary symptoms do not include dental injury, oral bleeding, oral lesions, headaches, fever, shortness of breath, sore throat, angioedema or cough. The symptoms began 12 to 24 hours ago. The symptoms are unchanged. The symptoms are recurrent. The symptoms occur constantly.  Additional symptoms include: dental sensitivity to temperature and jaw pain. Additional symptoms do not include: gum swelling, gum tenderness, purulent gums, trismus, facial swelling, trouble swallowing, pain with swallowing, excessive salivation, dry mouth, taste disturbance, smell disturbance, drooling, ear pain, hearing loss, nosebleeds, swollen glands, goiter and fatigue.    Past Medical History  Diagnosis Date  . Tuberculosis of lung, nodular   . TB (tuberculosis)     History reviewed. No pertinent past surgical history.  No family history on file.  History  Substance Use Topics  . Smoking status: Never Smoker   . Smokeless tobacco: Not on file  . Alcohol Use: No    OB History   Grav Para Term Preterm Abortions TAB SAB Ect Mult Living                  Review of Systems  Constitutional: Negative for fever, appetite change and fatigue.  HENT: Negative for hearing loss, ear pain, nosebleeds, sore throat, facial swelling, drooling and trouble swallowing.   Respiratory: Negative for cough and shortness of breath.   Gastrointestinal: Negative for nausea and vomiting.  Neurological: Negative for headaches.     Allergies  Rocephin  Home Medications  No current outpatient prescriptions on file.  BP 126/75  Pulse 58  Temp(Src) 98 F (36.7 C) (Oral)  Resp 14  SpO2 100%  LMP 08/28/2012  Physical Exam  Constitutional: She is oriented to person, place, and time. She appears well-developed and well-nourished. No distress.  HENT:  Head: Normocephalic and atraumatic. No trismus in the jaw.  Mouth/Throat: Uvula is midline. She does not have dentures. No oral lesions. Abnormal dentition. Dental caries present. No dental abscesses, edematous or lacerations.  Eyes: Conjunctivae are normal. No scleral icterus.  Neck: Normal range of motion.  Cardiovascular: Normal rate, regular rhythm and normal heart sounds.  Exam reveals no gallop and no friction rub.   No murmur heard. Pulmonary/Chest: Effort normal and breath sounds normal. No respiratory distress.  Abdominal: Soft. Bowel sounds are normal. She exhibits no distension and no mass. There is no tenderness. There is no guarding.  Neurological: She is alert and oriented to person, place, and time.  Skin: Skin is warm and dry. She is not diaphoretic.    ED Course  Procedures (including critical care time)  Labs Reviewed - No data to display No results found.   1. Pain, dental       MDM  12:38 AM Patient with toothache.  No gross abscess.  Exam unconcerning for Ludwig's angina or spread of infection.  Will treat with penicillin and pain medicine.  Urged patient to follow-up with dentist.   Patient given explicit instructions.  The instructions were translated into Nepali with google translate.  An english and  Nepali copy were given to the patient.  She is instructed to follow up with Dr. Patrcia Dolly. The patient appears reasonably screened and/or stabilized for discharge and I doubt any other medical condition or other [facility] requiring further screening, evaluation, or treatment in the ED at this time prior to discharge.         Arthor Captain, PA-C 09/05/12 0041

## 2012-09-06 NOTE — ED Provider Notes (Signed)
Medical screening examination/treatment/procedure(s) were performed by non-physician practitioner and as supervising physician I was immediately available for consultation/collaboration.  Jones Skene, M.D.     Jones Skene, MD 09/06/12 0030

## 2015-09-01 ENCOUNTER — Emergency Department (HOSPITAL_COMMUNITY)
Admission: EM | Admit: 2015-09-01 | Discharge: 2015-09-01 | Disposition: A | Discharge status: Home / Self Care | Payer: Medicaid Other | Attending: Emergency Medicine | Admitting: Emergency Medicine

## 2015-09-01 ENCOUNTER — Telehealth: Payer: Self-pay | Admitting: Surgery

## 2015-09-01 ENCOUNTER — Encounter (HOSPITAL_COMMUNITY): Payer: Self-pay | Admitting: Emergency Medicine

## 2015-09-01 DIAGNOSIS — R1013 Epigastric pain: Secondary | ICD-10-CM

## 2015-09-01 DIAGNOSIS — O219 Vomiting of pregnancy, unspecified: Secondary | ICD-10-CM | POA: Insufficient documentation

## 2015-09-01 DIAGNOSIS — Z8611 Personal history of tuberculosis: Secondary | ICD-10-CM | POA: Insufficient documentation

## 2015-09-01 DIAGNOSIS — R1084 Generalized abdominal pain: Secondary | ICD-10-CM | POA: Insufficient documentation

## 2015-09-01 DIAGNOSIS — O9989 Other specified diseases and conditions complicating pregnancy, childbirth and the puerperium: Secondary | ICD-10-CM | POA: Insufficient documentation

## 2015-09-01 LAB — CBC
HCT: 37.5 % (ref 36.0–46.0)
Hemoglobin: 12.4 g/dL (ref 12.0–15.0)
MCH: 29.6 pg (ref 26.0–34.0)
MCHC: 33.1 g/dL (ref 30.0–36.0)
MCV: 89.5 fL (ref 78.0–100.0)
PLATELETS: 165 10*3/uL (ref 150–400)
RBC: 4.19 MIL/uL (ref 3.87–5.11)
RDW: 12.6 % (ref 11.5–15.5)
WBC: 10.6 10*3/uL — AB (ref 4.0–10.5)

## 2015-09-01 LAB — URINALYSIS, ROUTINE W REFLEX MICROSCOPIC
Bilirubin Urine: NEGATIVE
GLUCOSE, UA: NEGATIVE mg/dL
Hgb urine dipstick: NEGATIVE
KETONES UR: NEGATIVE mg/dL
LEUKOCYTES UA: NEGATIVE
NITRITE: NEGATIVE
PROTEIN: NEGATIVE mg/dL
Specific Gravity, Urine: 1.017 (ref 1.005–1.030)
pH: 7.5 (ref 5.0–8.0)

## 2015-09-01 LAB — COMPREHENSIVE METABOLIC PANEL
ALT: 19 U/L (ref 14–54)
ANION GAP: 9 (ref 5–15)
AST: 30 U/L (ref 15–41)
Albumin: 3.8 g/dL (ref 3.5–5.0)
Alkaline Phosphatase: 48 U/L (ref 38–126)
BUN: 7 mg/dL (ref 6–20)
CHLORIDE: 103 mmol/L (ref 101–111)
CO2: 25 mmol/L (ref 22–32)
CREATININE: 0.59 mg/dL (ref 0.44–1.00)
Calcium: 9.7 mg/dL (ref 8.9–10.3)
GFR calc non Af Amer: >60 mL/min (ref >60)
Glucose, Bld: 104 mg/dL — ABNORMAL HIGH (ref 65–99)
POTASSIUM: 4 mmol/L (ref 3.5–5.1)
SODIUM: 137 mmol/L (ref 135–145)
TOTAL PROTEIN: 7.7 g/dL (ref 6.5–8.1)
Total Bilirubin: 0.8 mg/dL (ref 0.3–1.2)

## 2015-09-01 LAB — HCG, QUANTITATIVE, PREGNANCY: HCG, BETA CHAIN, QUANT, S: 268047 m[IU]/mL — AB (ref <5)

## 2015-09-01 LAB — LIPASE, BLOOD: LIPASE: 23 U/L (ref 11–51)

## 2015-09-01 MED ORDER — GI COCKTAIL ~~LOC~~
30.0000 mL | Freq: Once | ORAL | Status: AC
  Administered 2015-09-01: 30 mL via ORAL
  Filled 2015-09-01: qty 30

## 2015-09-01 MED ORDER — LANSOPRAZOLE 30 MG PO CPDR: 30.0000 mg | DELAYED_RELEASE_CAPSULE | Freq: Every day | ORAL | Status: DC

## 2015-09-01 NOTE — ED Notes (Signed)
Pt arrives via POv from home with n/v approx 3 days ago. Since has had periumbilical pain. Denies recent fever, hx of medical problems. VSS. Pt NAD at present.

## 2015-09-01 NOTE — ED Provider Notes (Signed)
CSN: [id number]     Arrival date & time 09/01/15  1148 History   First MD Initiated Contact with Patient 09/01/15 1316     Chief Complaint  Patient presents with  . Abdominal Pain     (Consider location/radiation/quality/duration/timing/severity/associated sxs/prior Treatment) HPI  Grace Manning is a(n) 40 y.o. female who presents for c/o abdominal pain. She speaks [place]. Translation is provided both by her daughter and professional translation services. The patient has had 3 days vomiting. Patient states that she ate a potato and was taking a shower when she suddenly became sick. She complains of epigastric abdominal pain, water brash, belching. She states that she drinks a lot of energy drinks and a lot of spicy foods. She hasn't noticed that this has specifically caused her symptoms in the past. However, she has had similar symptoms before. She states she has been unable to eat foods for the past 3 days. The patient also complains of suprapubic pain, but denies vaginal or urinary symptoms. She also denies back pain. The patient is concerned that she may possibly be pregnant pregnant and states that she does not want to have the baby and is very concerned about her family finding out about either a pregnancy oran elective abortion. Past Medical History  Diagnosis Date  . Tuberculosis of lung, nodular   . TB (tuberculosis)    History reviewed. No pertinent past surgical history. History reviewed. No pertinent family history. Social History  Substance Use Topics  . Smoking status: Never Smoker   . Smokeless tobacco: None  . Alcohol Use: No   OB History    No data available     Review of Systems  Ten systems reviewed and are negative for acute change, except as noted in the HPI.    Allergies  Review of patient's allergies indicates no known allergies.  Home Medications   Prior to Admission medications   Not on File   BP 112/81 mmHg  Pulse 64  Temp(Src) 97.5 F (36.4 C)  (Oral)  Resp 18  SpO2 100% Physical Exam  Constitutional: She is oriented to person, place, and time. She appears well-developed and well-nourished. No distress.  HENT:  Head: Normocephalic and atraumatic.  Eyes: Conjunctivae are normal. No scleral icterus.  Neck: Normal range of motion.  Cardiovascular: Normal rate, regular rhythm and normal heart sounds.  Exam reveals no gallop and no friction rub.   No murmur heard. Pulmonary/Chest: Effort normal and breath sounds normal. No respiratory distress.  Abdominal: Soft. Bowel sounds are normal. She exhibits no distension and no mass. There is no tenderness (Minimal diffuse tenderness.). There is no guarding.  Neurological: She is alert and oriented to person, place, and time.  Skin: Skin is warm and dry. She is not diaphoretic.  Nursing note and vitals reviewed.   ED Course  Procedures (including critical care time) Labs Review Labs Reviewed  COMPREHENSIVE METABOLIC PANEL - Abnormal; Notable for the following:    Glucose, Bld 104 (*)    All other components within normal limits  CBC - Abnormal; Notable for the following:    WBC 10.6 (*)    All other components within normal limits  URINALYSIS, ROUTINE W REFLEX MICROSCOPIC (NOT [facility]) - Abnormal; Notable for the following:    APPearance HAZY (*)    All other components within normal limits  LIPASE, BLOOD  HCG, QUANTITATIVE, PREGNANCY  POC URINE PREG, ED    Imaging Review No results found. I have personally reviewed and evaluated  these images and lab results as part of my medical decision-making.   EKG Interpretation None      MDM   Final diagnoses:  Epigastric pain  Pregnancy  This is a [place] female with epigastric pain and confirmed pregnancy on quantitative hCG test. Her labs show slightly elevated white blood cell count, but CMP, lipase and urine are not concerning. This patient's case is complicated by her cultural background, inability to speak [place] and  other various social barriers. The patient is married with a husband who is [place]. This pregnancy is unwanted and out of wedlock. The patient is extremely concerned about anyone in her family finding out.  She does not want to proceed with carrying the pregnancy.  The patient also has no translation services available outside of her family and friends.  I spent approximately 1 hour in both dialogue through translation services along with case management and trying to open the patient with her current needs.  We were able to establish an appointment with a clinic here in [place]. I have spoken with case manager Anola Gurney who is also trying to establish a translator who will accompany the patient, in order to provide services as well as obtaining Informed consent.  I have spoken with the patient. She will be contacted later today with a translator for her appointment times.  The patient also has a very high quantitative pregnancy. I performed a an informal bedside ultrasound with provider, nurse practitioner Maris Berger able to visualize a very small fetus with active heart rate in the uterus which appears very early in gestation aligning with the patient's reported last menstrual period of February 24. The patient will be given follow-up at the [facility] outpatient clinic for further management of her gynecologic and obstetric needs. I have also discussed birth control with the patient. She will need to follow up with the OB in the next 4 days. Because the patient's family returned to the room, I did not place pregnancy as a diagnosis in her AVS.   Arthor Captain, PA-C 09/02/15 1059  Benjiman Core, MD 09/11/15 1143

## 2015-09-01 NOTE — Telephone Encounter (Signed)
ED CM contacted patient via [employer] Nepali interpreter regarding referral, patient is requesting assistance with scheduling appt. [facility]oint[facility] scheduled [facility] Wed [facility]12 at 9a,  provided patient with appt. Information via interpreter, and [employer] number for Nepali translation.. Teach back done via interpreter, patient verbalized understanding, No further questions or concerns verbalized at this time.  No further CM needs identified.

## 2015-09-01 NOTE — Telephone Encounter (Signed)
ED CM attempted to contact patient via [employer] . Concerning assisting with a  referral for an [facility] [place] [phone].  CM was not able to contact patient CM will make second attempt later tonight, or in the am to follow up.

## 2015-09-01 NOTE — Discharge Instructions (Signed)
Please return to the ER with any new or worsening pain, vomiting, bleeding.  The case manager will call you tonight around 7 or 8 pm to give you your follow up appointment. She will call with an interpreter.   Abdominal Pain, Adult Many things can cause abdominal pain. Usually, abdominal pain is not caused by a disease and will improve without treatment. It can often be observed and treated at home. Your health care provider will do a physical exam and possibly order blood tests and X-rays to help determine the seriousness of your pain. However, in many cases, more time must pass before a clear cause of the pain can be found. Before that point, your health care provider may not know if you need more testing or further treatment. HOME CARE INSTRUCTIONS Monitor your abdominal pain for any changes. The following actions may help to alleviate any discomfort you are experiencing:  Only take over-the-counter or prescription medicines as directed by your health care provider.  Do not take laxatives unless directed to do so by your health care provider.  Try a clear liquid diet (broth, tea, or water) as directed by your health care provider. Slowly move to a bland diet as tolerated. SEEK MEDICAL CARE IF:  You have unexplained abdominal pain.  You have abdominal pain associated with nausea or diarrhea.  You have pain when you urinate or have a bowel movement.  You experience abdominal pain that wakes you in the night.  You have abdominal pain that is worsened or improved by eating food.  You have abdominal pain that is worsened with eating fatty foods.  You have a fever. SEEK IMMEDIATE MEDICAL CARE IF:  Your pain does not go away within 2 hours.  You keep throwing up (vomiting).  Your pain is felt only in portions of the abdomen, such as the right side or the left lower portion of the abdomen.  You pass bloody or black tarry stools. MAKE SURE YOU:  Understand these  instructions.  Will watch your condition.  Will get help right away if you are not doing well or get worse.   This information is not intended to replace advice given to you by your health care provider. Make sure you discuss any questions you have with your health care provider.   Document Released: 02/20/2005 Document Revised: 02/01/2015 Document Reviewed: 01/20/2013 Elsevier Interactive Patient Education [employer]

## 2015-09-15 ENCOUNTER — Telehealth: Payer: Self-pay

## 2015-09-15 NOTE — Telephone Encounter (Signed)
Pt presents to clinic today stating she appointment

## 2015-09-29 ENCOUNTER — Encounter: Payer: Medicaid Other | Admitting: Family Medicine

## 2016-02-03 ENCOUNTER — Emergency Department (HOSPITAL_COMMUNITY)
Admission: EM | Admit: 2016-02-03 | Discharge: 2016-02-03 | Disposition: A | Discharge status: Home / Self Care | Payer: No Typology Code available for payment source | Attending: Emergency Medicine | Admitting: Emergency Medicine

## 2016-02-03 ENCOUNTER — Encounter (HOSPITAL_COMMUNITY): Payer: Self-pay | Admitting: Emergency Medicine

## 2016-02-03 ENCOUNTER — Emergency Department (HOSPITAL_COMMUNITY): Payer: No Typology Code available for payment source

## 2016-02-03 DIAGNOSIS — R51 Headache: Secondary | ICD-10-CM | POA: Insufficient documentation

## 2016-02-03 DIAGNOSIS — M542 Cervicalgia: Secondary | ICD-10-CM | POA: Diagnosis present

## 2016-02-03 DIAGNOSIS — Y92410 Unspecified street and highway as the place of occurrence of the external cause: Secondary | ICD-10-CM | POA: Diagnosis not present

## 2016-02-03 DIAGNOSIS — Y939 Activity, unspecified: Secondary | ICD-10-CM | POA: Diagnosis not present

## 2016-02-03 DIAGNOSIS — M25562 Pain in left knee: Secondary | ICD-10-CM | POA: Diagnosis not present

## 2016-02-03 DIAGNOSIS — M25531 Pain in right wrist: Secondary | ICD-10-CM | POA: Diagnosis not present
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
## 2016-02-03 NOTE — Discharge Instructions (Signed)
You will hurt worse tomorrow.  ° °Take 4 over the counter ibuprofen tablets 3 times a day or 2 over-the-counter naproxen tablets twice a day for pain. °Also take tylenol 1000mg(2 extra strength) four times a day.  ° ° °

## 2016-11-07 ENCOUNTER — Ambulatory Visit (HOSPITAL_COMMUNITY)
Admission: EM | Admit: 2016-11-07 | Discharge: 2016-11-07 | Disposition: A | Discharge status: Home / Self Care | Payer: Medicaid Other | Attending: Emergency Medicine | Admitting: Emergency Medicine

## 2016-11-07 ENCOUNTER — Encounter (HOSPITAL_COMMUNITY): Payer: Self-pay | Admitting: Emergency Medicine

## 2016-11-07 DIAGNOSIS — R141 Gas pain: Secondary | ICD-10-CM | POA: Diagnosis not present

## 2016-11-07 DIAGNOSIS — R0789 Other chest pain: Secondary | ICD-10-CM | POA: Diagnosis not present

## 2016-11-07 DIAGNOSIS — K5901 Slow transit constipation: Secondary | ICD-10-CM

## 2016-11-07 MED ORDER — RANITIDINE HCL 150 MG PO CAPS: 150.0000 mg | ORAL_CAPSULE | Freq: Two times a day (BID) | ORAL | 0 refills | Status: AC

## 2016-11-07 MED ORDER — MELOXICAM 15 MG PO TABS: 15.0000 mg | ORAL_TABLET | Freq: Every day | ORAL | 0 refills | Status: DC

## 2016-11-07 NOTE — Discharge Instructions (Signed)
Take medications to help with your stomach as well as MiraLAX. Place 1 capful and 6-8 ounces of water or juice repeat this and 30 minutes and drink a third one 30 minutes later. Increase the fiber in your diet. You have pain in your chest wall which is primarily your ribs and muscles in the chest. You are prescribed a medicine to help with this.

## 2016-11-07 NOTE — ED Triage Notes (Signed)
Via Language line interpreter (Achut [id number])  Pt c/o constant abd pain onset 3 days associated w/CP and bad breath  Voices no other concerns  A&O x4... NAD... Family member at bedside

## 2016-11-07 NOTE — ED Provider Notes (Signed)
CSN: [id number]     Arrival date & time 11/07/16  1859 History   First MD Initiated Contact with Patient 11/07/16 1924     Chief Complaint  Patient presents with  . Abdominal Pain   (Consider location/radiation/quality/duration/timing/severity/associated sxs/prior Treatment) 41 year old Nepali-female accompanied by significant other with complaints concerning chest pain all over, generalized abdominal pain and bad breath for about 15 days. She states the chest pain hurt with cough and movement yet is constant. Occasionally she has shortness of breath on exertion. She states the pain feels like stabbing. She also is complaining of generalized abdominal gas and bloating. She took an unknown type of stomach pill today but did not help.  Video interpreter used.      Past Medical History:  Diagnosis Date  . TB (tuberculosis)   . Tuberculosis of lung, nodular    History reviewed. No pertinent surgical history. History reviewed. No pertinent family history. Social History  Substance Use Topics  . Smoking status: Never Smoker  . Smokeless tobacco: Never Used  . Alcohol use No   OB History    No data available     Review of Systems  Constitutional: Positive for activity change. Negative for fever.  HENT: Negative.   Respiratory: Positive for shortness of breath.   Cardiovascular: Positive for chest pain. Negative for leg swelling.  Gastrointestinal: Positive for abdominal pain and constipation. Negative for nausea and vomiting.  Genitourinary: Negative.   All other systems reviewed and are negative.   Allergies  Patient has no known allergies.  Home Medications   Prior to Admission medications   Medication Sig Start Date End Date Taking? Authorizing Provider  meloxicam (MOBIC) 15 MG tablet Take 1 tablet (15 mg total) by mouth daily. 11/07/16   Hayden RasmussenMabe, Yovany Clock, NP  ranitidine (ZANTAC) 150 MG capsule Take 1 capsule (150 mg total) by mouth 2 (two) times daily. 11/07/16   Hayden RasmussenMabe, Ammaar Encina,  NP   Meds Ordered and Administered this Visit  Medications - No data to display  BP 138/80 (BP Location: Left Arm)   Pulse 71   Temp 98.8 F (37.1 C) (Oral)   Resp 18   LMP 10/23/2016   SpO2 99%  No data found.   Physical Exam  Constitutional: She appears well-developed and well-nourished. No distress.  HENT:  Mouth/Throat: Oropharynx is clear and moist. No oropharyngeal exudate.  Eyes: EOM are normal.  Neck: Neck supple.  Cardiovascular: Normal rate, regular rhythm, normal heart sounds and intact distal pulses.   Pulmonary/Chest: Effort normal and breath sounds normal. No respiratory distress. She has no wheezes.  Generalized anterior chest wall tenderness greatest along the parasternal borders as well as the costal margins. This exacerbates the chest pain for which she presents.  Abdominal: Soft. Bowel sounds are normal. She exhibits no distension and no mass. There is no rebound and no guarding.  Various areas of tenderness to the abdomen. No rebound or guarding. Pressing on one side of the abdomen causes discomfort to the other side. There is no distention. Much of the abdomen percusses tympanic other areas are flat ordered all.  Musculoskeletal: She exhibits no edema.  Lymphadenopathy:    She has no cervical adenopathy.  Neurological: She is alert.  Skin: Skin is warm and dry.  Nursing note and vitals reviewed.   Urgent Care Course     Procedures (including critical care time)  Labs Review Labs Reviewed - No data to display  Imaging Review No results found.   Visual Acuity Review  Right Eye Distance:   Left Eye Distance:   Bilateral Distance:    Right Eye Near:   Left Eye Near:    Bilateral Near:         MDM   1. Anterior chest wall pain   2. Abdominal gas pain   3. Slow transit constipation    Take medications to help with your stomach as well as MiraLAX. Place 1 capful and 6-8 ounces of water or juice repeat this and 30 minutes and drink a  third one 30 minutes later. Increase the fiber in your diet. You have pain in your chest wall which is primarily your ribs and muscles in the chest. You are prescribed a medicine to help with this. Meds ordered this encounter  Medications  . ranitidine (ZANTAC) 150 MG capsule    Sig: Take 1 capsule (150 mg total) by mouth 2 (two) times daily.    Dispense:  30 capsule    Refill:  0    Order Specific Question:   Supervising Provider    Answer:   Eustace Moore [[id number]]  . meloxicam (MOBIC) 15 MG tablet    Sig: Take 1 tablet (15 mg total) by mouth daily.    Dispense:  14 tablet    Refill:  0    Order Specific Question:   Supervising Provider    Answer:   Eustace Moore [[id number]]       Hayden Rasmussen, NP 11/07/16 2002

## 2016-11-13 ENCOUNTER — Encounter (HOSPITAL_COMMUNITY): Payer: Self-pay | Admitting: Emergency Medicine

## 2016-11-13 ENCOUNTER — Emergency Department (HOSPITAL_COMMUNITY): Payer: Medicaid Other

## 2016-11-13 ENCOUNTER — Emergency Department (HOSPITAL_COMMUNITY)
Admission: EM | Admit: 2016-11-13 | Discharge: 2016-11-13 | Disposition: A | Discharge status: Home / Self Care | Payer: Medicaid Other | Attending: Emergency Medicine | Admitting: Emergency Medicine

## 2016-11-13 DIAGNOSIS — R1010 Upper abdominal pain, unspecified: Secondary | ICD-10-CM | POA: Insufficient documentation

## 2016-11-13 DIAGNOSIS — R0789 Other chest pain: Secondary | ICD-10-CM

## 2016-11-13 DIAGNOSIS — R196 Halitosis: Secondary | ICD-10-CM | POA: Insufficient documentation

## 2016-11-13 LAB — CBC
HEMATOCRIT: 34.3 % — AB (ref 36.0–46.0)
HEMOGLOBIN: 11.1 g/dL — AB (ref 12.0–15.0)
MCH: 29.2 pg (ref 26.0–34.0)
MCHC: 32.4 g/dL (ref 30.0–36.0)
MCV: 90.3 fL (ref 78.0–100.0)
Platelets: 144 10*3/uL — ABNORMAL LOW (ref 150–400)
RBC: 3.8 MIL/uL — ABNORMAL LOW (ref 3.87–5.11)
RDW: 12.8 % (ref 11.5–15.5)
WBC: 9 10*3/uL (ref 4.0–10.5)

## 2016-11-13 LAB — BASIC METABOLIC PANEL
ANION GAP: 5 (ref 5–15)
BUN: 12 mg/dL (ref 6–20)
CHLORIDE: 105 mmol/L (ref 101–111)
CO2: 27 mmol/L (ref 22–32)
Calcium: 9.2 mg/dL (ref 8.9–10.3)
Creatinine, Ser: 0.68 mg/dL (ref 0.44–1.00)
GFR calc Af Amer: >60 mL/min (ref >60)
GLUCOSE: 86 mg/dL (ref 65–99)
POTASSIUM: 3.7 mmol/L (ref 3.5–5.1)
SODIUM: 137 mmol/L (ref 135–145)

## 2016-11-13 LAB — I-STAT TROPONIN, ED: Troponin i, poc: 0 ng/mL (ref 0.00–0.08)

## 2016-11-13 MED ORDER — MELOXICAM 15 MG PO TABS: 15.0000 mg | ORAL_TABLET | Freq: Every day | ORAL | 0 refills | Status: AC

## 2016-11-13 MED ORDER — GI COCKTAIL ~~LOC~~
30.0000 mL | Freq: Once | ORAL | Status: AC
  Administered 2016-11-13: 30 mL via ORAL
  Filled 2016-11-13: qty 30

## 2016-11-13 MED ORDER — OMEPRAZOLE 20 MG PO CPDR: 20.0000 mg | DELAYED_RELEASE_CAPSULE | Freq: Two times a day (BID) | ORAL | 0 refills | Status: AC

## 2016-11-13 NOTE — ED Notes (Signed)
Translator used for discharge by edp, patient and family denies any questions or concerns

## 2016-11-13 NOTE — Discharge Instructions (Signed)
Taken the Prilosec twice daily before meals. You may take Mobic as needed for pain in the chest wall. You may apply ice or heat for 20 minutes 3 times daily for comfort. Follow up with a primary care physician for reevaluation. Return to the ED if any concerning signs or symptoms develop.

## 2016-11-13 NOTE — ED Notes (Signed)
Esignature not available. 

## 2016-11-13 NOTE — ED Provider Notes (Signed)
BMP and CBC unremarkable.  [facility]-EMER[facility]ENCY DEPT Provider Note   CSN: [id number] Arrival date & time: 11/13/16  1453     History   Chief Complaint Chief Complaint  Patient presents with  . Chest Pain    HPI Grace Manning is a 41 y.o. female with history of generalized headaches, fatigue, dizziness, anorexia, and TB who presents today with chief complaint gradual onset, intermittent generalized chest pain and upper abdominal pain for one week. She states that pain is burning in quality, worsened with eating. She says that while eating she feels a burning sensation going down her throat, to the abdomen. Chest pain is also exacerbated with deep breaths and cough. She endorses shortness of breath due to pain on inspiration. She was seen and evaluated for the same symptoms 6 days ago. She was sent home with Zantac for reflux and Mobic for her reproducible chest pain on palpation. She endorses reduced oral intake secondary to pain. She states she has intermittent lightheadedness, which she believes is due to reduced oral intake. She also endorses bad breath. She denies cardiac history. She denies nausea, vomiting, diarrhea, constipation, melena, hematuria, dysuria. Patient's daughter served as Nurse, learning disability. The history is provided by the patient. The history is limited by a language barrier. A language interpreter was used (Patient's daughter translated).    Past Medical History:  Diagnosis Date  . TB (tuberculosis)   . Tuberculosis of lung, nodular     Patient Active Problem List   Diagnosis Date Noted  . Dizziness 11/16/2011  . Fatigue 11/16/2011  . Generalized headaches 11/16/2011  . Anorexia 11/16/2011  . 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  OB History    No data available       Home Medications    Prior to Admission medications   Medication Sig Start Date End Date Taking? Authorizing Provider  meloxicam (MOBIC) 15 MG tablet Take 1 tablet (15 mg  total) by mouth daily. 11/13/16   Luevenia Maxin, Shontez Sermon A, PA-C  omeprazole (PRILOSEC) 20 MG capsule Take 1 capsule (20 mg total) by mouth 2 (two) times daily before a meal. 11/13/16 11/27/16  Jerelene Salaam A, PA-C  ranitidine (ZANTAC) 150 MG capsule Take 1 capsule (150 mg total) by mouth 2 (two) times daily. 11/07/16   Hayden Rasmussen, NP    Family History No family history on file.  Social History Social History  Substance Use Topics  . Smoking status: Never Smoker  . Smokeless tobacco: Never Used  . Alcohol use No     Allergies   Patient has no known allergies.   Review of Systems Review of Systems  Respiratory: Positive for shortness of breath.   Cardiovascular: Positive for chest pain.  Gastrointestinal: Positive for abdominal pain. Negative for blood in stool, constipation, diarrhea, nausea and vomiting.  Genitourinary: Negative for dysuria, flank pain and hematuria.  Neurological: Positive for light-headedness.  All other systems reviewed and are negative.    Physical Exam Updated Vital Signs BP 122/88   Pulse (!) 52   Temp 97 F (36.1 C)   Resp 18   LMP 11/09/2016 (Exact Date)   SpO2 100%   Physical Exam  Constitutional: She appears well-developed and well-nourished. No distress.  HENT:  Head: Normocephalic and atraumatic.  Eyes: Conjunctivae are normal. Right eye exhibits no discharge. Left eye exhibits no discharge.  Neck: Neck supple. No JVD present. No tracheal deviation present.  Cardiovascular: Normal rate, regular rhythm, normal heart sounds and  intact distal pulses.   No murmur heard. 2+ radial and DP/PT pulses bl, negative Homan's bl   Pulmonary/Chest: Effort normal and breath sounds normal. No respiratory distress. She exhibits tenderness.  Diffuse anterior chest wall TTP, primarily parasternally and along the costal margins. Equal rise and fall of chest, no increased work of breathing, no paradoxical wall motion  Abdominal: Soft. Bowel sounds are normal. There is  no tenderness. There is no rebound and no guarding.  Generalized upper abdominal TTP, no focal tenderness. Murphy's absent, Rovsing's absent, no TTP at McBurney's point.   Musculoskeletal: She exhibits no edema.  Neurological: She is alert.  Skin: Skin is warm and dry.  Psychiatric: She has a normal mood and affect. Her behavior is normal.  Nursing note and vitals reviewed.    ED Treatments / Results  Labs (all labs ordered are listed, but only abnormal results are displayed) Labs Reviewed  CBC - Abnormal; Notable for the following:       Result Value   RBC 3.80 (*)    Hemoglobin 11.1 (*)    HCT 34.3 (*)    Platelets 144 (*)    All other components within normal limits  BASIC METABOLIC PANEL  I-STAT TROPOININ, ED    EKG  EKG Interpretation None       Radiology Dg Chest 2 View  Result Date: 11/13/2016 CLINICAL DATA:  Chest pain EXAM: CHEST  2 VIEW COMPARISON:  Chest radiograph 02/03/2016 FINDINGS: The heart size and mediastinal contours are within normal limits. Both lungs are clear. The visualized skeletal structures are unremarkable. IMPRESSION: No active cardiopulmonary disease. Electronically Signed   By: Deatra RobinsonKevin  Herman M.D.   On: 11/13/2016 16:01    Procedures Procedures (including critical care time)  Medications Ordered in ED Medications  gi cocktail (Maalox,Lidocaine,Donnatal) (30 mLs Oral Given 11/13/16 1801)     Initial Impression / Assessment and Plan / ED Course  I have reviewed the triage vital signs and the nursing notes.  Pertinent labs & imaging results that were available during my care of the patient were reviewed by me and considered in my medical decision making (see chart for details).     Patient with ongoing complaints of halitosis, burning chest pain and epigastric pain worsening with eating. She has been evaluated in the urgent care for the same. Afebrile, vital signs are stable. Chest pain reproducible on palpation. EKG normal sinus rhythm,  troponin negative, low suspicion of ACS/MI with no risk factors. Low suspicion of PE. Pain improved significantly with GI cocktail. BMP unremarkable, no leukocytosis. Low suspicion of infectious process including appendicitis, colitis, pyelonephritis. Suspect symptoms related to GERD. 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 Explained to patient importance of taking her PPI daily, as well as follow-up with primary care for reevaluation and possible EGD for further evaluation. Discussed indications for return to the ED. Patient and patient's daughter verbalized understanding of and agreement with plan and patient is stable for discharge home at this time.  Final Clinical Impressions(s) / ED Diagnoses   Final diagnoses:  Chest wall pain  Pain of upper abdomen  Halitosis    New Prescriptions Discharge Medication List as of 11/13/2016  6:56 PM    START taking these medications   Details  omeprazole (PRILOSEC) 20 MG capsule Take 1 capsule (20 mg total) by mouth 2 (two) times daily before a meal., Starting Wed 11/13/2016, Until Wed 11/27/2016, Print         Luevenia MaxinFawze, [place] A, PA-C 11/13/16 1937  Azalia Bilis, MD 11/14/16 [phone]

## 2016-11-13 NOTE — ED Triage Notes (Signed)
Pt states 2 days of chest pain that has gotten worse. Pt speaks Nepali. Pt states burning to generalized epigastric region.
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
## 2017-04-15 ENCOUNTER — Inpatient Hospital Stay: Admit: 2017-04-15 | Discharge: 2017-04-16 | Disposition: A | Discharge status: Home / Self Care | Payer: MEDICAID

## 2017-04-15 ENCOUNTER — Emergency Department: Admit: 2017-04-15 | Payer: MEDICAID | Primary: Family

## 2017-04-15 DIAGNOSIS — R1013 Epigastric pain: Secondary | ICD-10-CM

## 2017-04-15 MED ORDER — LIDOCAINE VISCOUS 2 % MT SOLN
2 % | Freq: Once | OROMUCOSAL | Status: AC
Start: 2017-04-15 — End: 2017-04-15
  Administered 2017-04-16: 01:00:00 via ORAL

## 2017-04-15 NOTE — ED Notes (Signed)
Pt provided with discharge instructions and prescriptions X 2. Pt verbalized understanding of establishing pcp. Pt ambulatory out of the ED.      Finis BudMegan Andree Golphin, RN  04/15/17 2026

## 2017-04-15 NOTE — ED Provider Notes (Addendum)
[facility]  [facility] EMERGENCY DEPARTMENT  [address] [place] [address]  Dept: [phone]  Loc: [phone]  eMERGENCYdEPARTMENT eNCOUnter      Pt Name: Deborah Riley  MRN: [id number]  Birthdate 12-23-1975  Date of evaluation: 04/15/2017  Provider:Kinzy Weyers Dory Peru, APRN - CNP     Evaluated by the advanced practice provider    CHIEF COMPLAINT       Chief Complaint   Patient presents with   . Chest Pain     five or six months of chest pain. shortness of breath as well. worse now. denies cough. she also c/o bad breath. worse when she is laying down at bed       CRITICAL CARE TIME       HISTORY OF PRESENT ILLNESS  (Location/Symptom, Timing/Onset, Context/Setting, Quality, Duration,Modifying Factors, Severity.)   Deborah Riley is a 41 y.o. female who presents to the emergency department And waning of epigastric abdominal pain, chest tightness. Has been occurring for 5 or 6 months. Mild nausea. No vomiting. A burning sensation in the chest and the throat. Bad breath. Pain is made much worse after eating. Often eats in the evening and then lies down the bed. Pain and burning in the epigastric to the chest sensation is much worse when lying flat and trying to sleep.    Denies back pain. Denies arm pain. Denies jaw pain. Denies fever. Denies chills.  Patient is [place], does not speak [place].       Nursing Notes were reviewedand agreed with or any disagreements were addressed in the HPI.    REVIEW OF SYSTEMS    (2-9 systems for level 4, 10 or more for level 5)     Review of Systems   Constitutional: Negative.  Negative for activity change, appetite change, diaphoresis, fatigue and fever.   HENT: Negative.    Respiratory: Negative.    Cardiovascular: Positive for chest pain. Negative for palpitations and leg swelling.   Gastrointestinal: Positive for abdominal pain and nausea. Negative for vomiting.   Genitourinary: Negative.    Musculoskeletal: Negative.  Negative for back  pain.   Skin: Negative.    Neurological: Negative.        Except as noted above the remainder of the review of systems was reviewed and negative.       PAST MEDICAL HISTORY   History reviewed. No pertinent past medical history.    SURGICAL HISTORY     History reviewed. No pertinent surgical history.    CURRENT MEDICATIONS     @EDPTMEDCONT @    ALLERGIES     Patient has no known allergies.    FAMILY HISTORY     History reviewed. No pertinent family history.  No family status information on file.        SOCIAL HISTORY      reports that she has never smoked. She has never used smokeless tobacco. She reports that she does not drink alcohol or use drugs.    PHYSICAL EXAM    (up to 7 for level 4, 8 or more for level 5)     ED Triage Vitals [04/15/17 1750]   Enc Vitals Group      BP 109/69      Pulse 75      Resp 15      Temp 98.1 F (36.7 C)      Temp Source Oral      SpO2 100 %  Weight 127 lb 3.3 oz (57.7 kg)      Height 5\' 2"  (1.575 m)      Head Circumference       Peak Flow       Pain Score       Pain Loc       Pain Edu?       Excl. in GC?        Physical Exam   Constitutional: She is oriented to person, place, and time. Vital signs are normal. She appears well-developed and well-nourished. She is cooperative.  Non-toxic appearance. She does not have a sickly appearance. She does not appear ill. No distress.   HENT:   Head: Normocephalic.   Eyes: Pupils are equal, round, and reactive to light.   Cardiovascular: Normal rate and regular rhythm.    Pulmonary/Chest: Effort normal and breath sounds normal.   Abdominal: Soft. Bowel sounds are normal. She exhibits no mass. There is no tenderness. There is no guarding.   Neurological: She is alert and oriented to person, place, and time.   Skin: Skin is warm and dry. Capillary refill takes less than 2 seconds. She is not diaphoretic.   Psychiatric: She has a normal mood and affect.   Nursing note and vitals reviewed.        DIAGNOSTIC RESULTS     EKG: All EKG's are  interpreted by the Emergency Department Physician who either signs or Co-signs this chart in the absence of a cardiologist.  EKG interpreted per Dr. Katrinka BlazingSmith and reviewed per myself sinus rhythm. Possible sinus arrhythmia. There are complex. No axis deviation. No evidence of T wave inversion. No evidence of ischemia. No evidence of STEMI. Ventricular rate 75, PR interval 140, QRS 70, QT 370.  RADIOLOGY:   Non-plain film images such as CT, Ultrasound and MRI are read by the radiologist. Plain radiographic images are visualized and preliminarilyinterpreted by the emergency physician with the below findings:    Interpretation per the Radiologist below,if available at the time of this note:    XR CHEST STANDARD (2 VW)   Final Result   No acute process.               LABS:  Labs Reviewed   CBC WITH AUTO DIFFERENTIAL - Abnormal; Notable for the following:        Result Value    MPV 11.3 (*)     All other components within normal limits    Narrative:     Performed at:  [facility]  [address]  [place], [place] [address]   Phone [phone]   URINE RT REFLEX TO CULTURE - Abnormal; Notable for the following:     Clarity, UA CLOUDY (*)     All other components within normal limits    Narrative:     Performed at:  [facility]  [address]  [place], [place] [address]   Phone [phone]   MICROSCOPIC URINALYSIS - Abnormal; Notable for the following:     Bacteria, UA 1+ (*)     Epi Cells 12 (*)     All other components within normal limits    Narrative:     Performed at:  [facility]  [address]  [place], [place] [id number]   Phone [phone]   TROPONIN    Narrative:     Performed at:  [facility]  [address] [place], [place] [address]   Phone [phone]   COMPREHENSIVE METABOLIC PANEL    Narrative:     Performed at:  [facility]  [address]  [place], [place] [address]   Phone [phone]   LIPASE    Narrative:     Performed at:  [facility]  [address] [place], [place] [address]   Phone [phone]   PREGNANCY, URINE    Narrative:     Performed at:  [facility]  [address] [place], [place] [address]   Phone [phone]       All other labs were within normal range or not returned as of this dictation.    EMERGENCY DEPARTMENT COURSE and DIFFERENTIAL DIAGNOSIS/MDM:   Vitals:    Vitals:    04/15/17 1750   BP: 109/69   Pulse: 75   Resp: 15   Temp: 98.1 F (36.7 C)   TempSrc: Oral   SpO2: 100%   Weight: 127 lb 3.3 oz (57.7 kg)   Height: 5\' 2"  (1.575 m)     Medications   aluminum & magnesium hydroxide-simethicone (MAALOX) 30 mL, lidocaine viscous (XYLOCAINE) 5 mL (GI COCKTAIL) ( Oral Given 04/15/17 1932)       Interpreter service is used to obtain HPI, ROS, PE,: [place]: #[id number]      MDM  Differential diagnosis: Gastric reflux, gastritis, peptic ulcer disease, unstable angina, cholecystitis, cholelithiasis, esophagitis  Was seen and evaluated per myself. Dr. Katrinka Blazing was present and available for consultation as needed.  It is unlikely that this is cardiac in origin and she has had no symptoms that have progressed or worsens. It's been pretty steady and constant with burning epigastric pain that radiates into the chest, difficult to take a deep breath, bad breath, worsening pain when lying flat after eating. And the symptoms have been going on for 5 or 6 months.  She denies any known cardiac history.    1945: Bedside reevaluation. Patient resting comfortably. Smiling. No distress. Reports some relief of pain and discomfort after the GI cocktail.    All laboratory studies are unremarkable with no abnormality. No evidence of cholecystitis, no evidence of GI bleeding. Troponin and EKG and chest x-ray are unremarkable.  Feel that this is probably GI in origin. She can be discharged  home with an internal medicine referral for follow-up. I'll start her on omeprazole and Carafate. I did discuss all of the discharge instructions disposition of diagnostic studies with the patient. She verbalizes understanding and she is discharged home in good condition.  CONSULTS:  None    PROCEDURES:  Procedures    FINAL IMPRESSION      1. Abdominal pain, epigastric    2. Dyspepsia          DISPOSITION/PLAN   @EDDISPO @    PATIENT REFERRED TO:  [facility]  [phone]  Schedule an appointment as soon as possible for a visit in 3 days        DISCHARGE MEDICATIONS:  New Prescriptions    OMEPRAZOLE (PRILOSEC) 20 MG DELAYED RELEASE CAPSULE    Take 1 capsule by mouth 2 times daily (before meals)    SUCRALFATE (CARAFATE) 1 GM/10ML SUSPENSION    Take 10 mLs by mouth 4 times daily       (Please note that portions of this note were completed with a  voice recognition program.  Efforts were made to edit the dictations but occasionally words are mis-transcribed.)    Raydell Maners Dory PeruSue Ysabelle Goodroe, APRN - CNP          Edman Circleelinda S Hykeem Ojeda, APRN - CNP  04/15/17 1957       Edman CircleDelinda S Zael Shuman, APRN - CNP  04/15/17 1958

## 2017-04-16 LAB — CBC WITH AUTO DIFFERENTIAL
Basophils %: 0.5 %
Basophils Absolute: 0 10*3/uL (ref 0.0–0.2)
Eosinophils %: 1.9 %
Eosinophils Absolute: 0.1 10*3/uL (ref 0.0–0.6)
Hematocrit: 38.2 % (ref 36.0–48.0)
Hemoglobin: 12.3 g/dL (ref 12.0–16.0)
Lymphocytes %: 21.3 %
Lymphocytes Absolute: 1.4 10*3/uL (ref 1.0–5.1)
MCH: 29.6 pg (ref 26.0–34.0)
MCHC: 32.2 g/dL (ref 31.0–36.0)
MCV: 91.8 fL (ref 80.0–100.0)
MPV: 11.3 fL — ABNORMAL HIGH (ref 5.0–10.5)
Monocytes %: 12.5 %
Monocytes Absolute: 0.8 10*3/uL (ref 0.0–1.3)
Neutrophils %: 63.8 %
Neutrophils Absolute: 4.2 10*3/uL (ref 1.7–7.7)
Platelets: 154 10*3/uL (ref 135–450)
RBC: 4.16 M/uL (ref 4.00–5.20)
RDW: 13.1 % (ref 12.4–15.4)
WBC: 6.5 10*3/uL (ref 4.0–11.0)

## 2017-04-16 LAB — COMPREHENSIVE METABOLIC PANEL
ALT: 18 U/L (ref 10–40)
AST: 21 U/L (ref 15–37)
Albumin/Globulin Ratio: 1.1 (ref 1.1–2.2)
Albumin: 4 g/dL (ref 3.4–5.0)
Alkaline Phosphatase: 54 U/L (ref 40–129)
Anion Gap: 11 (ref 3–16)
BUN: 11 mg/dL (ref 7–20)
CO2: 26 mmol/L (ref 21–32)
Calcium: 9.1 mg/dL (ref 8.3–10.6)
Chloride: 102 mmol/L (ref 99–110)
Creatinine: 0.6 mg/dL (ref 0.6–1.1)
GFR African American: >60 (ref >60)
GFR Non-African American: >60 (ref >60)
Globulin: 3.6 g/dL
Glucose: 91 mg/dL (ref 70–99)
Potassium: 4 mmol/L (ref 3.5–5.1)
Sodium: 139 mmol/L (ref 136–145)
Total Bilirubin: 0.6 mg/dL (ref 0.0–1.0)
Total Protein: 7.6 g/dL (ref 6.4–8.2)

## 2017-04-16 LAB — URINALYSIS WITH REFLEX TO CULTURE
Bilirubin Urine: NEGATIVE
Blood, Urine: NEGATIVE
Glucose, Ur: NEGATIVE mg/dL
Ketones, Urine: NEGATIVE mg/dL
Leukocyte Esterase, Urine: NEGATIVE
Nitrite, Urine: NEGATIVE
Protein, UA: NEGATIVE mg/dL
Specific Gravity, UA: 1.01 (ref 1.005–1.030)
Urobilinogen, Urine: 0.2 E.U./dL (ref <2.0)
pH, UA: 6.5 (ref 5.0–8.0)

## 2017-04-16 LAB — PREGNANCY, URINE: HCG(Urine) Pregnancy Test: NEGATIVE

## 2017-04-16 LAB — MICROSCOPIC URINALYSIS
Epithelial Cells, UA: 12 /HPF — ABNORMAL HIGH (ref 0–5)
Hyaline Casts, UA: 1 /LPF (ref 0–8)
RBC, UA: 1 /HPF (ref 0–4)
WBC, UA: 4 /HPF (ref 0–5)

## 2017-04-16 LAB — LIPASE: Lipase: 37 U/L (ref 13.0–60.0)

## 2017-04-16 LAB — TROPONIN: Troponin: <0.01 ng/mL (ref <0.01)

## 2017-04-16 MED ORDER — SUCRALFATE 1 GM/10ML PO SUSP
1 GM/0ML | Freq: Four times a day (QID) | ORAL | 3 refills | Status: DC
Start: 2017-04-16 — End: 2017-06-09

## 2017-04-16 MED ORDER — OMEPRAZOLE 20 MG PO CPDR
20 MG | ORAL_CAPSULE | Freq: Two times a day (BID) | ORAL | 0 refills | Status: DC
Start: 2017-04-16 — End: 2017-05-09

## 2017-04-16 MED FILL — MAG-AL PLUS 200-200-20 MG/5ML PO LIQD: 200-200-20 MG/5ML | ORAL | Qty: 30

## 2017-05-03 ENCOUNTER — Emergency Department: Admit: 2017-05-04 | Payer: MEDICAID | Primary: Family

## 2017-05-03 DIAGNOSIS — R0789 Other chest pain: Secondary | ICD-10-CM

## 2017-05-03 NOTE — ED Provider Notes (Signed)
[facility] EMERGENCY DEPARTMENT  eMERGENCY dEPARTMENT eNCOUnter      Pt Name: Deborah Riley  VWU:[id number]  Birthdate 05-22-1976  Date of evaluation: 05/03/2017  Provider: Sharlotte Alamo, PA-C   ED attending physician: Hazle Nordmann, M.D.      This patient was seen and evaluated by the supervising physician Dr. Hazle Nordmann, M.D.      Chief Complaint:    Chief Complaint   Patient presents with   . Chest Pain     Pt brought in by ems for chest pain, sore throat and generally not feeling well for 2 weeks. Pt given 324 asa in squad       Nursing Notes, Past Medical Hx, Past Surgical Hx, Social Hx, Allergies, and Family Hx were all reviewed and agreed with or any disagreements were addressed in the HPI.    HPI:  (Location, Duration, Timing, Severity,Quality, Assoc Sx, Context, Modifying factors)  This is a  41 y.o. female who complain of cough, congestion, headaches, generalized body aches and fever for over 2 weeks. Her primary care physician called her an antibiotic but she left it at home and does not know the name of it. No nausea vomiting. Denies abdominal pain, no diarrhea constipation. Does complain of some weakness. No other complaints. No history of hypertension or diabetes. States she is not on any other medication. She denies having any allergies. No history of asthma or COPD.      PastMedical/Surgical History:  History reviewed. No pertinent past medical history.  History reviewed. No pertinent surgical history.    Medications:  Previous Medications    OMEPRAZOLE (PRILOSEC) 20 MG DELAYED RELEASE CAPSULE    Take 1 capsule by mouth 2 times daily (before meals)    SUCRALFATE (CARAFATE) 1 GM/10ML SUSPENSION    Take 10 mLs by mouth 4 times daily         Review of Systems:  Review of Systems   Constitutional: Negative for chills and fever.   HENT: Positive for congestion and sore throat. Negative for facial swelling.    Eyes: Negative for discharge and redness.   Respiratory: Positive for  cough. Negative for apnea, choking and shortness of breath.    Cardiovascular: Positive for chest pain.   Gastrointestinal: Negative for abdominal pain, nausea and vomiting.   Genitourinary: Negative for dysuria.   Musculoskeletal: Negative for back pain, myalgias, neck pain and neck stiffness.   Neurological: Negative for dizziness, tremors, seizures, weakness and headaches.   All other systems reviewed and are negative.    Positives and Pertinent negatives as per HPI.  Except as noted above in the ROS, problem specific ROS was completed and is negative.    Physical Exam:  Physical Exam   Constitutional: She is oriented to person, place, and time. She appears well-developed and well-nourished.   HENT:   Head: Normocephalic and atraumatic.   Nose: Nose normal.   Eyes: Pupils are equal, round, and reactive to light. EOM are normal. Right eye exhibits no discharge. Left eye exhibits no discharge.   Neck: Normal range of motion. Neck supple. No spinous process tenderness and no muscular tenderness present. No Brudzinski's sign and no Kernig's sign noted.   Cardiovascular: Normal rate, regular rhythm and normal heart sounds.  Exam reveals no gallop and no friction rub.    No murmur heard.  Pulmonary/Chest: Effort normal and breath sounds normal. No respiratory distress. She has no wheezes. She has no rales. She exhibits tenderness.   Abdominal: Soft.  Bowel sounds are normal. She exhibits no distension. There is no tenderness.   Musculoskeletal: Normal range of motion.   Neurological: She is alert and oriented to person, place, and time. She has normal strength. No cranial nerve deficit or sensory deficit. GCS eye subscore is 4. GCS verbal subscore is 5. GCS motor subscore is 6.   Skin: Skin is warm and dry. She is not diaphoretic.   Psychiatric: She has a normal mood and affect. Her behavior is normal.   Nursing note and vitals reviewed.      MEDICAL DECISION MAKING    Vitals:    Vitals:    05/03/17 2320 05/03/17 2330  05/03/17 2340 05/03/17 2347   BP:       Pulse: 68 66 65    Resp: 23 18 22     Temp:       TempSrc:       SpO2: 98% 99% 99% 98%   Weight:           LABS:  Labs Reviewed   CBC WITH AUTO DIFFERENTIAL - Abnormal; Notable for the following:        Result Value    WBC 13.3 (*)     RBC 3.96 (*)     Hemoglobin 11.8 (*)     Hematocrit 35.9 (*)     MPV 10.9 (*)     Neutrophils # 10.2 (*)     All other components within normal limits    Narrative:     Performed at:  [facility]  [address]  [place], [place] [address]   Phone [phone]   COMPREHENSIVE METABOLIC PANEL - Abnormal; Notable for the following:     Potassium 3.3 (*)     Chloride 98 (*)     Albumin/Globulin Ratio 1.0 (*)     ALT 9 (*)     AST 14 (*)     All other components within normal limits    Narrative:     Performed at:  [facility]  [address]  [place] , [place] [address]   Phone [phone]   URINE RT REFLEX TO CULTURE - Abnormal; Notable for the following:     Ketones, Urine 15 (*)     Blood, Urine TRACE (*)     All other components within normal limits    Narrative:     Performed at:  [facility]  [address]  [place], [place] [address]   Phone [phone]   BRAIN NATRIURETIC PEPTIDE - Abnormal; Notable for the following:     Riley-BNP 144 (*)     All other components within normal limits    Narrative:     Performed at:  [facility]  [address]  [place], [place] [address]   Phone [phone]   TROPONIN    Narrative:     Performed at:  [facility]  [address]  [place], [place] [address]   Phone [phone]   PREGNANCY, URINE    Narrative:     Performed at:  [facility]  [address]  [place], [place] [address]   Phone [phone]   MICROSCOPIC URINALYSIS    Narrative:     Performed at:  [facility]  [address]  , [place] [id number]  Phone [phone]        Remainder of labs reviewed and werenegative at this time or not returned at the time of this note.    RADIOLOGY:   Non-plain film images such as CT, Ultrasound and MRI are read by the radiologist. I, Sharlotte Alamo, PA-C have directly visualized the radiologic plain film image(s) with the below findings:        Interpretation per the Radiologist below, if available at the time of thisnote:    XR CHEST STANDARD (2 VW)   Final Result   No acute process detected.              Xr Chest Standard (2 Vw)    Result Date: 05/03/2017  EXAMINATION: TWO VIEWS OF THE CHEST 05/03/2017 5:56 pm COMPARISON: 04/15/2017 HISTORY: ORDERING SYSTEM PROVIDED HISTORY: Cough and shortness of breath with fever TECHNOLOGIST PROVIDED HISTORY: Reason for exam:->Cough and shortness of breath with fever Ordering Physician Provided Reason for Exam: Cough and shortness of breath with fever FINDINGS: The heart mediastinal contours are unremarkable in appearance.  Lungs appear clear, with no focal infiltrate.  No pneumothorax.  No free air.  No acute bony abnormality.     No acute process detected.     Xr Chest Standard (2 Vw)    Result Date: 04/15/2017  EXAMINATION: TWO VIEWS OF THE CHEST 04/15/2017 3:11 pm COMPARISON: None. HISTORY: ORDERING SYSTEM PROVIDED HISTORY: cp, epigastric pain TECHNOLOGIST PROVIDED HISTORY: Reason for exam:->cp, epigastric pain Ordering Physician Provided Reason for Exam: cp, epigastric pain Acuity: Acute Type of Exam: Initial Additional signs and symptoms: Chest Pain (five or six months of chest pain. shortness of breath as well. worse now. denies cough. she also c/o bad breath. worse when she is laying down at bed) FINDINGS: The lungs are without acute focal process.  There is no effusion or pneumothorax. The cardiomediastinal silhouette is without acute process. The osseous structures are without acute process.     No acute process.        MEDICAL  DECISION MAKING / ED COURSE:      PROCEDURES:   Procedures    None    Patient was given:  Medications   0.9 % sodium chloride bolus (1,000 mLs Intravenous New Bag 05/03/17 2211)   acetaminophen (TYLENOL) tablet 1,000 mg (1,000 mg Oral Given 05/03/17 2111)   ketorolac (TORADOL) injection 30 mg (30 mg Intravenous Given 05/03/17 2351)         Emergency room course: Patient on exam throat was clear and nonerythematous no exudate. Tonsils show no enlargement uvula is midline without swelling. Cardiovascular regular rhythm, lungs clear no wheezes rales or rhonchi. Mild lower chest wall and upper abdominal tenderness with palpation along the area of the diaphragm. No midline tenderness over thoracic or lumbar spine. Abdomen was soft nontender. No rebound or guarding noted. Normal bowel sounds all 4 quadrants. Patient full range of motion of all extremity. Neurologically no motor sensory deficit noted. She is alert and oriented 4.    Patient heart score is zero.      Lab results from today urinalysis negative for leukocytes negative for nitrites just trace of blood and 15 ketones. Negative for pregnancy.      CBC has a white count of 13.3, RBC 3.96, hemoglobin 11.8 and hematocrit 35.9. CMP has sodium 136, potassium 3.3, chloride 98 with a BUN of 11 and creatinine 0.6. Normal transaminases. Troponin less than 0.01. BNP 144.      Chest x-ray show no  acute cardiopulmonary disease.      At this time I did discuss patient discharge plan. I do believe her chest walls not cardiac related her chest wall pain is more related to her persistent coughing. I'll put her on Robitussin-DM and ibuprofen. Patient was okay with this plan.          The patient tolerated their visit well.  I evaluated the patient.  The physician was available for consultation as needed.  The patient and / or the family were informed of the results of anytests, a time was given to answer questions, a plan was proposed and they agreed with plan.      CLINICAL  IMPRESSION:  1. Upper respiratory tract infection, unspecified type    2. Chest wall pain        DISPOSITION Decision To Discharge 05/03/2017 11:54:04 PM      PATIENT REFERRED TO:  No follow-up provider specified.    DISCHARGE MEDICATIONS:  New Prescriptions    GUAIFENESIN-DEXTROMETHORPHAN (ROBITUSSIN DM) 100-10 MG/5ML SYRUP    Take 5 mLs by mouth 3 times daily as needed for Cough    IBUPROFEN (ADVIL;MOTRIN) 800 MG TABLET    Take 1 tablet by mouth every 8 hours as needed for Pain       DISCONTINUED MEDICATIONS:  Discontinued Medications    No medications on file              (Please note the MDM and HPI sections of this note were completed with a voice recognition program.  Efforts weremade to edit the dictations but occasionally words are mis-transcribed.)    Electronically signed, Sharlotte Alamoodney Linsy Ehresman, PA-C,          Sharlotte Alamoodney Nickcole Bralley, PA-C  05/06/17 2237

## 2017-05-03 NOTE — Discharge Instructions (Signed)
Keep your appointment on Tuesday which her primary care physician.  Take prescribed medication as prescribed only.  Take the Motrin for your body aches and/or fever.  Return if worsens.

## 2017-05-03 NOTE — ED Provider Notes (Signed)
This patient was seen independently by the mid-level provider.  The purpose of this note is EKG interpretation only.  EKG visualized pulmonary interpreted by myself shows sinus rhythm.  The rate is 80.  Axis is normal at 0.  Significant amount of left current appearance affects interpretation but overall this appears to be a normal EKG with minimal nonspecific ST changes in the inferior leads.     Synetta FailWilliam E Tyreik Delahoussaye, MD  05/04/17 [phone]

## 2017-05-03 NOTE — ED Notes (Signed)
Bed: B-05  Expected date:   Expected time:   Means of arrival: [facility]  Comments:  41 ill x 2 weeks, CP     Clista BernhardtKatie Ann Sosie Gato, RN  05/03/17 2007

## 2017-05-03 NOTE — ED Notes (Signed)
Patient assisted to bathroom for urine sample, c/o dizziness upon sitting up.  Assisted to wheelchair then to bathroom.  Returned to bed without difficulty.  Rails up x 2, call light within reach.  MLP notified.     Macon LargeLori Bunch, RN  05/03/17 2207

## 2017-05-04 ENCOUNTER — Inpatient Hospital Stay: Admit: 2017-05-04 | Discharge: 2017-05-04 | Disposition: A | Discharge status: Home / Self Care | Payer: MEDICAID

## 2017-05-04 LAB — EKG 12-LEAD
Atrial Rate: 80 {beats}/min
P Axis: 37 degrees
P-R Interval: 122 ms
Q-T Interval: 354 ms
QRS Duration: 70 ms
QTc Calculation (Bazett): 408 ms
R Axis: 0 degrees
T Axis: -11 degrees
Ventricular Rate: 80 {beats}/min

## 2017-05-04 LAB — CBC WITH AUTO DIFFERENTIAL
Basophils %: 0.6 %
Basophils Absolute: 0.1 10*3/uL (ref 0.0–0.2)
Eosinophils %: 0.3 %
Eosinophils Absolute: 0 10*3/uL (ref 0.0–0.6)
Hematocrit: 35.9 % — ABNORMAL LOW (ref 36.0–48.0)
Hemoglobin: 11.8 g/dL — ABNORMAL LOW (ref 12.0–16.0)
Lymphocytes %: 12.9 %
Lymphocytes Absolute: 1.7 10*3/uL (ref 1.0–5.1)
MCH: 29.9 pg (ref 26.0–34.0)
MCHC: 33 g/dL (ref 31.0–36.0)
MCV: 90.6 fL (ref 80.0–100.0)
MPV: 10.9 fL — ABNORMAL HIGH (ref 5.0–10.5)
Monocytes %: 9.5 %
Monocytes Absolute: 1.3 10*3/uL (ref 0.0–1.3)
Neutrophils %: 76.7 %
Neutrophils Absolute: 10.2 10*3/uL — ABNORMAL HIGH (ref 1.7–7.7)
Platelets: 145 10*3/uL (ref 135–450)
RBC: 3.96 M/uL — ABNORMAL LOW (ref 4.00–5.20)
RDW: 12.9 % (ref 12.4–15.4)
WBC: 13.3 10*3/uL — ABNORMAL HIGH (ref 4.0–11.0)

## 2017-05-04 LAB — TROPONIN: Troponin: <0.01 ng/mL (ref <0.01)

## 2017-05-04 LAB — COMPREHENSIVE METABOLIC PANEL
ALT: 9 U/L — ABNORMAL LOW (ref 10–40)
AST: 14 U/L — ABNORMAL LOW (ref 15–37)
Albumin/Globulin Ratio: 1 — ABNORMAL LOW (ref 1.1–2.2)
Albumin: 3.7 g/dL (ref 3.4–5.0)
Alkaline Phosphatase: 65 U/L (ref 40–129)
Anion Gap: 13 (ref 3–16)
BUN: 11 mg/dL (ref 7–20)
CO2: 25 mmol/L (ref 21–32)
Calcium: 8.5 mg/dL (ref 8.3–10.6)
Chloride: 98 mmol/L — ABNORMAL LOW (ref 99–110)
Creatinine: 0.6 mg/dL (ref 0.6–1.1)
GFR African American: >60 (ref >60)
GFR Non-African American: >60 (ref >60)
Globulin: 3.6 g/dL
Glucose: 97 mg/dL (ref 70–99)
Potassium: 3.3 mmol/L — ABNORMAL LOW (ref 3.5–5.1)
Sodium: 136 mmol/L (ref 136–145)
Total Bilirubin: 0.5 mg/dL (ref 0.0–1.0)
Total Protein: 7.3 g/dL (ref 6.4–8.2)

## 2017-05-04 LAB — MICROSCOPIC URINALYSIS
Epithelial Cells, UA: 4 /HPF (ref 0–5)
Hyaline Casts, UA: 2 /LPF (ref 0–8)
RBC, UA: 3 /HPF (ref 0–4)
WBC, UA: 2 /HPF (ref 0–5)

## 2017-05-04 LAB — URINALYSIS WITH REFLEX TO CULTURE
Bilirubin Urine: NEGATIVE
Glucose, Ur: NEGATIVE mg/dL
Ketones, Urine: 15 mg/dL — AB
Leukocyte Esterase, Urine: NEGATIVE
Nitrite, Urine: NEGATIVE
Protein, UA: NEGATIVE mg/dL
Specific Gravity, UA: 1.02 (ref 1.005–1.030)
Urobilinogen, Urine: 1 E.U./dL (ref <2.0)
pH, UA: 6.5 (ref 5.0–8.0)

## 2017-05-04 LAB — PREGNANCY, URINE: HCG(Urine) Pregnancy Test: NEGATIVE

## 2017-05-04 LAB — BRAIN NATRIURETIC PEPTIDE: Pro-BNP: 144 pg/mL — ABNORMAL HIGH (ref 0–124)

## 2017-05-04 MED ORDER — IBUPROFEN 800 MG PO TABS
800 MG | ORAL_TABLET | Freq: Three times a day (TID) | ORAL | 0 refills | Status: DC | PRN
Start: 2017-05-04 — End: 2017-06-09

## 2017-05-04 MED ORDER — GUAIFENESIN-DM 100-10 MG/5ML PO SYRP
100-10 MG/5ML | Freq: Three times a day (TID) | ORAL | 0 refills | Status: AC | PRN
Start: 2017-05-04 — End: 2017-05-13

## 2017-05-04 MED ORDER — KETOROLAC TROMETHAMINE 30 MG/ML IJ SOLN
30 MG/ML | Freq: Once | INTRAMUSCULAR | Status: AC
Start: 2017-05-04 — End: 2017-05-03
  Administered 2017-05-04: 05:00:00 30 mg via INTRAVENOUS

## 2017-05-04 MED ORDER — SODIUM CHLORIDE 0.9 % IV BOLUS
0.9 % | Freq: Once | INTRAVENOUS | Status: AC
Start: 2017-05-04 — End: 2017-05-04
  Administered 2017-05-04: 03:00:00 1000 mL via INTRAVENOUS

## 2017-05-04 MED ORDER — ACETAMINOPHEN 500 MG PO TABS
500 MG | Freq: Once | ORAL | Status: AC
Start: 2017-05-04 — End: 2017-05-03
  Administered 2017-05-04: 02:00:00 1000 mg via ORAL

## 2017-05-04 MED FILL — ACETAMINOPHEN EXTRA STRENGTH 500 MG PO TABS: 500 mg | ORAL | Qty: 2

## 2017-05-04 MED FILL — KETOROLAC TROMETHAMINE 30 MG/ML IJ SOLN: 30 mg/mL | INTRAMUSCULAR | Qty: 1

## 2017-05-06 ENCOUNTER — Ambulatory Visit: Admit: 2017-05-06 | Discharge: 2017-05-06 | Payer: MEDICAID | Attending: Registered Nurse | Primary: Family

## 2017-05-06 ENCOUNTER — Encounter

## 2017-05-06 ENCOUNTER — Inpatient Hospital Stay: Admit: 2017-05-06 | Payer: MEDICAID | Primary: Family

## 2017-05-06 DIAGNOSIS — R1010 Upper abdominal pain, unspecified: Secondary | ICD-10-CM

## 2017-05-06 LAB — SEDIMENTATION RATE: Sed Rate: 48 mm/Hr — ABNORMAL HIGH (ref 0–20)

## 2017-05-06 LAB — CBC WITH AUTO DIFFERENTIAL
Basophils %: 0.4 %
Basophils Absolute: 0 10*3/uL (ref 0.0–0.2)
Eosinophils %: 2 %
Eosinophils Absolute: 0.2 10*3/uL (ref 0.0–0.6)
Hematocrit: 39 % (ref 36.0–48.0)
Hemoglobin: 12.9 g/dL (ref 12.0–16.0)
Lymphocytes %: 25.8 %
Lymphocytes Absolute: 2 10*3/uL (ref 1.0–5.1)
MCH: 30.1 pg (ref 26.0–34.0)
MCHC: 33.2 g/dL (ref 31.0–36.0)
MCV: 90.8 fL (ref 80.0–100.0)
MPV: 11.5 fL — ABNORMAL HIGH (ref 5.0–10.5)
Monocytes %: 7.8 %
Monocytes Absolute: 0.6 10*3/uL (ref 0.0–1.3)
Neutrophils %: 64 %
Neutrophils Absolute: 4.8 10*3/uL (ref 1.7–7.7)
Platelets: 189 10*3/uL (ref 135–450)
RBC: 4.3 M/uL (ref 4.00–5.20)
RDW: 12.6 % (ref 12.4–15.4)
WBC: 7.6 10*3/uL (ref 4.0–11.0)

## 2017-05-06 LAB — COMPREHENSIVE METABOLIC PANEL
ALT: 13 U/L (ref 10–40)
AST: 18 U/L (ref 15–37)
Albumin/Globulin Ratio: 1.3 (ref 1.1–2.2)
Albumin: 4.5 g/dL (ref 3.4–5.0)
Alkaline Phosphatase: 72 U/L (ref 40–129)
Anion Gap: 13 (ref 3–16)
BUN: 10 mg/dL (ref 7–20)
CO2: 28 mmol/L (ref 21–32)
Calcium: 9.8 mg/dL (ref 8.3–10.6)
Chloride: 100 mmol/L (ref 99–110)
Creatinine: <0.5 mg/dL — ABNORMAL LOW (ref 0.6–1.1)
GFR African American: >60 (ref >60)
GFR Non-African American: >60 (ref >60)
Globulin: 3.6 g/dL
Glucose: 86 mg/dL (ref 70–99)
Potassium: 4.5 mmol/L (ref 3.5–5.1)
Sodium: 141 mmol/L (ref 136–145)
Total Bilirubin: 0.7 mg/dL (ref 0.0–1.0)
Total Protein: 8.1 g/dL (ref 6.4–8.2)

## 2017-05-06 LAB — TSH WITH REFLEX: TSH: 7.6 u[IU]/mL — ABNORMAL HIGH (ref 0.27–4.20)

## 2017-05-06 LAB — T4, FREE: T4 Free: 1.4 ng/dL (ref 0.9–1.8)

## 2017-05-06 LAB — AMYLASE: Amylase: 62 U/L (ref 25–115)

## 2017-05-06 LAB — D-DIMER, QUANTITATIVE: D-Dimer, Quant: 327 ng/mL DDU — ABNORMAL HIGH (ref 0–229)

## 2017-05-06 LAB — C-REACTIVE PROTEIN HIGH SENSITIVITY: CRP High Sensitivity: 13.7 mg/L — ABNORMAL HIGH (ref 0.16–3.00)

## 2017-05-06 LAB — LIPASE: Lipase: 28 U/L (ref 13.0–60.0)

## 2017-05-06 MED ORDER — IOHEXOL 240 MG/ML IJ SOLN
240 MG/ML | Freq: Once | INTRAMUSCULAR | Status: AC | PRN
Start: 2017-05-06 — End: 2017-05-06
  Administered 2017-05-06: 23:00:00 50 mL via ORAL

## 2017-05-06 MED ORDER — IOPAMIDOL 76 % IV SOLN
76 % | Freq: Once | INTRAVENOUS | Status: AC | PRN
Start: 2017-05-06 — End: 2017-05-06
  Administered 2017-05-06: 23:00:00 75 mL via INTRAVENOUS

## 2017-05-06 MED ORDER — ONDANSETRON 8 MG PO TBDP
8 MG | ORAL_TABLET | Freq: Three times a day (TID) | ORAL | 1 refills | Status: DC | PRN
Start: 2017-05-06 — End: 2017-05-30

## 2017-05-06 NOTE — Patient Instructions (Signed)
Patient Education        Abdominal Pain: Care Instructions  Your Care Instructions    Abdominal pain has many possible causes. Some aren't serious and get better on their own in a few days. Others need more testing and treatment. If your pain continues or gets worse, you need to be rechecked and may need more tests to find out what is wrong. You may need surgery to correct the problem.  Don't ignore new symptoms, such as fever, nausea and vomiting, urination problems, pain that gets worse, and dizziness. These may be signs of a more serious problem.  Your doctor may have recommended a follow-up visit in the next 8 to 12 hours. If you are not getting better, you may need more tests or treatment.  The doctor has checked you carefully, but problems can develop later. If you notice any problems or new symptoms, get medical treatment right away.  Follow-up care is a key part of your treatment and safety. Be sure to make and go to all appointments, and call your doctor if you are having problems. It's also a good idea to know your test results and keep a list of the medicines you take.  How can you care for yourself at home?   Rest until you feel better.   To prevent dehydration, drink plenty of fluids, enough so that your urine is light yellow or clear like water. Choose water and other caffeine-free clear liquids until you feel better. If you have kidney, heart, or liver disease and have to limit fluids, talk with your doctor before you increase the amount of fluids you drink.   If your stomach is upset, eat mild foods, such as rice, dry toast or crackers, bananas, and applesauce. Try eating several small meals instead of two or three large ones.   Wait until 48 hours after all symptoms have gone away before you have spicy foods, alcohol, and drinks that contain caffeine.   Do not eat foods that are high in fat.   Avoid anti-inflammatory medicines such as aspirin, ibuprofen (Advil, Motrin), and naproxen (Aleve).  These can cause stomach upset. Talk to your doctor if you take daily aspirin for another health problem.  When should you call for help?  Call 911 anytime you think you may need emergency care. For example, call if:    You passed out (lost consciousness).     You pass maroon or very bloody stools.     You vomit blood or what looks like coffee grounds.     You have new, severe belly pain.   Call your doctor now or seek immediate medical care if:    Your pain gets worse, especially if it becomes focused in one area of your belly.     You have a new or higher fever.     Your stools are black and look like tar, or they have streaks of blood.     You have unexpected vaginal bleeding.     You have symptoms of a urinary tract infection. These may include:  ? Pain when you urinate.  ? Urinating more often than usual.  ? Blood in your urine.     You are dizzy or lightheaded, or you feel like you may faint.   Watch closely for changes in your health, and be sure to contact your doctor if:    You are not getting better after 1 day (24 hours).   Where can you learn more?    Go to [email or web address] and sign in to your MyChart account. Enter E907 in the Search Health Information box to learn more about "Abdominal Pain: Care Instructions."     If you do not have an account, please click on the "Sign Up Now" link.  Current as of: April 15, 2016  Content Version: 11.8   2006-2018 [employer]. Care instructions adapted under license by [facility]. If you have questions about a medical condition or this instruction, always ask your healthcare professional. [employer] disclaims any warranty or liability for your use of this information.

## 2017-05-06 NOTE — Progress Notes (Signed)
[facility]  Clinic Note    Date: 05/06/2017                                               Subjective/Objective:     Chief Complaint   Patient presents with   . Established New Doctor     NEW PATIENT EST CARE    . GI Problem     GI ISSUES, STOMACH PAIN ONGOING 2 MONTHS       HPI Patient presents to establish care. Married with 4 children. Aged 42-18.Moved to [place] in September from [place]. Moved to [place] in 2012. Nepali interpreter services used.     Patient has complaints of stomach pains x 2-3 months. Happens at night- every night- now happening during the day. She is unable to sleep. Located in epigastric region- radiates to right and left side. Describes as sharp. No correlation with food. Has been seen in ER for this- negative cardiac and pulmonary work up. Treated with Prilosec, Carafate, and Ibuprofen with minimal relief. States pain is present right now. Patient states she feels that the pain is killing her. Has nausea- no vomiting. No change in BM- sometimes constipation- rare. No urinary symptoms, fever, chills, or other GI symptoms. Has attempted to treat with PPI, Ibuprofen, and bland diet with no relief. Symptoms are getting worse- currently she rates the pain severe- 10/10. Getting anxiety from pain- concerned something serious is going on.       There are no active problems to display for this patient.      No past medical history on file.    No past surgical history on file.    Admission on 05/03/2017, Discharged on 05/04/2017   Component Date Value Ref Range Status   . WBC 05/03/2017 13.3* 4.0 - 11.0 K/uL Final   . RBC 05/03/2017 3.96* 4.00 - 5.20 M/uL Final   . Hemoglobin 05/03/2017 11.8* 12.0 - 16.0 g/dL Final   . Hematocrit 05/03/2017 35.9* 36.0 - 48.0 % Final   . MCV 05/03/2017 90.6  80.0 - 100.0 fL Final   . MCH 05/03/2017 29.9  26.0 - 34.0 pg Final   . MCHC 05/03/2017 33.0  31.0 - 36.0 g/dL Final   . RDW 05/03/2017 12.9  12.4 - 15.4 % Final   . Platelets 05/03/2017 145   135 - 450 K/uL Final   . MPV 05/03/2017 10.9* 5.0 - 10.5 fL Final   . Neutrophils % 05/03/2017 76.7  % Final   . Lymphocytes % 05/03/2017 12.9  % Final   . Monocytes % 05/03/2017 9.5  % Final   . Eosinophils % 05/03/2017 0.3  % Final   . Basophils % 05/03/2017 0.6  % Final   . Neutrophils # 05/03/2017 10.2* 1.7 - 7.7 K/uL Final   . Lymphocytes # 05/03/2017 1.7  1.0 - 5.1 K/uL Final   . Monocytes # 05/03/2017 1.3  0.0 - 1.3 K/uL Final   . Eosinophils # 05/03/2017 0.0  0.0 - 0.6 K/uL Final   . Basophils # 05/03/2017 0.1  0.0 - 0.2 K/uL Final   . Sodium 05/03/2017 136  136 - 145 mmol/L Final   . Potassium 05/03/2017 3.3* 3.5 - 5.1 mmol/L Final   . Chloride 05/03/2017 98* 99 - 110 mmol/L Final   . CO2 05/03/2017 25  21 - 32 mmol/L  Final   . Anion Gap 05/03/2017 13  3 - 16 Final   . Glucose 05/03/2017 97  70 - 99 mg/dL Final   . BUN 05/03/2017 11  7 - 20 mg/dL Final   . CREATININE 05/03/2017 0.6  0.6 - 1.1 mg/dL Final   . GFR Non-African American 05/03/2017 >60  >60 Final    Comment: >60 mL/min/1.28m EGFR, calc. for ages 176and older using the  MDRD formula (not corrected for weight), is valid for stable  renal function.     . GFR African American 05/03/2017 >60  >60 Final    Comment: Chronic Kidney Disease: less than 60 ml/min/1.73 sq.m.          Kidney Failure: less than 15 ml/min/1.73 sq.m.  Results valid for patients 18 years and older.     . Calcium 05/03/2017 8.5  8.3 - 10.6 mg/dL Final   . Total Protein 05/03/2017 7.3  6.4 - 8.2 g/dL Final   . Alb 05/03/2017 3.7  3.4 - 5.0 g/dL Final   . Albumin/Globulin Ratio 05/03/2017 1.0* 1.1 - 2.2 Final   . Total Bilirubin 05/03/2017 0.5  0.0 - 1.0 mg/dL Final   . Alkaline Phosphatase 05/03/2017 65  40 - 129 U/L Final   . ALT 05/03/2017 9* 10 - 40 U/L Final   . AST 05/03/2017 14* 15 - 37 U/L Final   . Globulin 05/03/2017 3.6  g/dL Final   . Troponin 05/03/2017 <0.01  <0.01 ng/mL Final    Methodology by Troponin T   . Color, UA 05/03/2017 YELLOW  Straw/Yellow Final   .  Clarity, UA 05/03/2017 Clear  Clear Final   . Glucose, Ur 05/03/2017 Negative  Negative mg/dL Final   . Bilirubin Urine 05/03/2017 Negative  Negative Final   . Ketones, Urine 05/03/2017 15* Negative mg/dL Final   . Specific Gravity, UA 05/03/2017 1.020  1.005 - 1.030 Final   . Blood, Urine 05/03/2017 TRACE* Negative Final   . pH, UA 05/03/2017 6.5  5.0 - 8.0 Final   . Protein, UA 05/03/2017 Negative  Negative mg/dL Final   . Urobilinogen, Urine 05/03/2017 1.0  <2.0 E.U./dL Final   . Nitrite, Urine 05/03/2017 Negative  Negative Final   . Leukocyte Esterase, Urine 05/03/2017 Negative  Negative Final   . Microscopic Examination 05/03/2017 YES   Final   . Urine Reflex to Culture 05/03/2017 Not Indicated   Final   . Urine Type 05/03/2017 Clean catch   Final   . HCG(Urine) Pregnancy Test 05/03/2017 Negative  Detects HCG level >20 MIU/mL Final    Comment: Note:  False Negative pregnancy results have been reported in  early pregnancy due to insufficient amounts of hCG and in late  first trimester pregnancies, though very rare, due to the hook  effect.  Always repeat results in question with a serum  quantitative pregnancy test. A serum hCG is positive 2-5 days  before the urine pregnancy test.     . Pro-BNP 05/03/2017 144* 0 - 124 pg/mL Final    Comment: Methodology by NT-proBNP    An age-independent cutoff point of 300 pg/ml has a 98%  negative predictive value excluding acute heart failure.  Values exceeding the age-related cutoff values (450 pg/mL if  age<50, 900 if 50-75 and 1800 if >75) has 90% sensitivity and  84% specificity for diagnosing acute HF. In patients with  renal compromise (eGFR<60) values greater than 1200pg/ml have  a diagnostic sensitivity and specificity of 89% and 72% for  acute HF.     Marland Kitchen Ventricular Rate 05/03/2017 80  BPM Final   . Atrial Rate 05/03/2017 80  BPM Final   . P-R Interval 05/03/2017 122  ms Final   . QRS Duration 05/03/2017 70  ms Final   . Q-T Interval 05/03/2017 354  ms Final   . QTc  Calculation (Bazett) 05/03/2017 408  ms Final   . P Axis 05/03/2017 37  degrees Final   . R Axis 05/03/2017 0  degrees Final   . T Axis 05/03/2017 -11  degrees Final   . Diagnosis 05/03/2017 Normal sinus rhythmPossible Left atrial enlargementRSR' or QR pattern in V1 suggests right ventricular conduction delayNonspecific ST and T wave abnormalityAbnormal ECGWhen compared with ECG of 15-Apr-2017 17:44,Nonspecific T wave abnormality now evident in Anterior leadsConfirmed by STRIET MD, JEFFREY ([id number]) on 05/04/2017 10:00:51 AM   Final   . Hyaline Casts, UA 05/03/2017 2  0 - 8 /LPF Final   . WBC, UA 05/03/2017 2  0 - 5 /HPF Final   . RBC, UA 05/03/2017 3  0 - 4 /HPF Final   . Epi Cells 05/03/2017 4  0 - 5 /HPF Final    Comment: Urinalysis microscopic performed using the  automated methodology (AUWI analyzer).         No family history on file.    Current Outpatient Prescriptions   Medication Sig Dispense Refill   . ibuprofen (ADVIL;MOTRIN) 800 MG tablet Take 1 tablet by mouth every 8 hours as needed for Pain 30 tablet 0   . guaiFENesin-dextromethorphan (ROBITUSSIN DM) 100-10 MG/5ML syrup Take 5 mLs by mouth 3 times daily as needed for Cough 120 mL 0   . sucralfate (CARAFATE) 1 GM/10ML suspension Take 10 mLs by mouth 4 times daily 1200 mL 3   . omeprazole (PRILOSEC) 20 MG delayed release capsule Take 1 capsule by mouth 2 times daily (before meals) 60 capsule 0     No current facility-administered medications for this visit.        No Known Allergies    Review of Systems   Constitutional: Negative for chills, diaphoresis, fatigue and fever.   Respiratory: Negative for cough, chest tightness, shortness of breath and wheezing.    Cardiovascular: Negative for chest pain and palpitations.   Gastrointestinal: Positive for abdominal pain and nausea. Negative for abdominal distention, anal bleeding, blood in stool, constipation, diarrhea, rectal pain and vomiting.   Genitourinary: Negative.    Neurological: Negative for dizziness,  weakness, light-headedness, numbness and headaches.   Psychiatric/Behavioral: Negative for dysphoric mood, self-injury, sleep disturbance and suicidal ideas. The patient is nervous/anxious.    All other systems reviewed and are negative.      Vitals:  BP 110/78 (Site: Right Upper Arm, Position: Sitting, Cuff Size: Medium Adult)   Pulse 68   Resp 16   Ht 5' (1.524 m)   Wt 124 lb (56.2 kg)   LMP  (Exact Date)   Breastfeeding? No   BMI 24.22 kg/m     Physical Exam   Constitutional: She is oriented to person, place, and time. She appears well-developed and well-nourished.   HENT:   Head: Normocephalic and atraumatic.   Right Ear: Tympanic membrane, external ear and ear canal normal.   Left Ear: Tympanic membrane, external ear and ear canal normal.   Nose: Nose normal.   Mouth/Throat: Uvula is midline, oropharynx is clear and moist and mucous membranes are normal. No tonsillar exudate.   Eyes: Pupils are equal, round, and  reactive to light. Conjunctivae and EOM are normal.   Neck: Normal range of motion. Neck supple.   Cardiovascular: Normal rate, regular rhythm, normal heart sounds and intact distal pulses.    Pulmonary/Chest: Effort normal and breath sounds normal.   Abdominal: Soft. Normal appearance and bowel sounds are normal. She exhibits no distension and no mass. There is no hepatosplenomegaly. There is tenderness (severe) in the right upper quadrant, epigastric area and left upper quadrant. There is no rigidity, no rebound, no guarding, no CVA tenderness, no tenderness at McBurney's point and negative Murphy's sign. No hernia.   Neurological: She is alert and oriented to person, place, and time.   Skin: Skin is warm and dry. Capillary refill takes less than 2 seconds.   Psychiatric: She has a normal mood and affect. Her behavior is normal. Judgment and thought content normal.   Nursing note and vitals reviewed.      Assessment/Plan     1. Upper abdominal pain  Unsure of etiology- EKG in ER normal- will  rule out h.pylori and other serious etiology with blood work and scan. Educated to go to ER if abdominal pain becomes severe. Son will take her to hospital now for CT scan. Zofran for nausea. Will treat based on lab findings.  - CBC Auto Differential; Future  - Comprehensive Metabolic Panel; Future  - Lipase; Future  - Amylase; Future  - T4, Free; Future  - TSH with Reflex; Future  - CT ABDOMEN PELVIS W IV CONTRAST  - D-Dimer, Quantitative; Future  - CRP,High Sensitivity; Future  - Sedimentation Rate; Future  - ondansetron (ZOFRAN ODT) 8 MG TBDP disintegrating tablet; Take 1 tablet by mouth every 8 hours as needed for Nausea  Dispense: 12 tablet; Refill: 1  - H PYLORI BREATH TEST; Future  - CBC Auto Differential  - Comprehensive Metabolic Panel  - Lipase  - Amylase  - T4, Free  - TSH with Reflex  - D-Dimer, Quantitative  - CRP,High Sensitivity  - Sedimentation Rate  - H PYLORI BREATH TEST      Orders Placed This Encounter   Procedures   . CT ABDOMEN PELVIS W IV CONTRAST     Order Specific Question:   Reason for exam:     Answer:   upper abdominal pain   . CBC Auto Differential     Standing Status:   Future     Number of Occurrences:   1     Standing Expiration Date:   05/06/2018   . Comprehensive Metabolic Panel     Standing Status:   Future     Number of Occurrences:   1     Standing Expiration Date:   05/06/2018   . Lipase     Standing Status:   Future     Number of Occurrences:   1     Standing Expiration Date:   05/06/2018   . Amylase     Standing Status:   Future     Number of Occurrences:   1     Standing Expiration Date:   05/06/2018   . T4, Free     Standing Status:   Future     Number of Occurrences:   1     Standing Expiration Date:   05/06/2018   . TSH with Reflex     Standing Status:   Future     Number of Occurrences:   1     Standing Expiration Date:   05/06/2018   .  D-Dimer, Quantitative     Standing Status:   Future     Number of Occurrences:   1     Standing Expiration Date:   05/06/2018   .  CRP,High Sensitivity     Standing Status:   Future     Number of Occurrences:   1     Standing Expiration Date:   05/06/2018   . Sedimentation Rate     Standing Status:   Future     Number of Occurrences:   1     Standing Expiration Date:   05/06/2018   . H PYLORI BREATH TEST     Standing Status:   Future     Number of Occurrences:   1     Standing Expiration Date:   05/06/2018       Return in about 1 month (around 06/06/2017), or if symptoms worsen or fail to improve, for Complete physical and fasting labs.    Ernesta Amble, NP    05/06/2017  3:07 PM

## 2017-05-07 LAB — EKG 12-LEAD
Atrial Rate: 75 {beats}/min
P Axis: 60 degrees
P-R Interval: 140 ms
Q-T Interval: 370 ms
QRS Duration: 78 ms
QTc Calculation (Bazett): 413 ms
R Axis: 11 degrees
T Axis: 32 degrees
Ventricular Rate: 75 {beats}/min

## 2017-05-08 ENCOUNTER — Encounter: Attending: Registered Nurse | Primary: Family

## 2017-05-08 ENCOUNTER — Telehealth

## 2017-05-08 NOTE — Telephone Encounter (Signed)
PT MISSED APPT TODAY = HAD XRAYS DONE -  PLEASE CALL WHEN    READ  -- PT  @ [phone]

## 2017-05-08 NOTE — Telephone Encounter (Signed)
THIS PHONE # DOES NOT WORK. IF PT CALL BACK PLEASE COME GET ME OR RELAY MESSAGE.

## 2017-05-08 NOTE — Telephone Encounter (Signed)
CT scan was normal. No acute process. Still waiting on other lab results to come in. Please schedule follow up appointment for sometime next week to go over other results and answer any questions since interpreter services required.

## 2017-05-09 LAB — H. PYLORI BREATH TEST: H Pylori Breath Test: POSITIVE — AB

## 2017-05-09 MED ORDER — CLARITHROMYCIN 500 MG PO TABS
500 MG | ORAL_TABLET | Freq: Two times a day (BID) | ORAL | 0 refills | Status: AC
Start: 2017-05-09 — End: 2017-05-23

## 2017-05-09 MED ORDER — AMOXICILLIN 500 MG PO CAPS
500 MG | ORAL_CAPSULE | Freq: Two times a day (BID) | ORAL | 0 refills | Status: AC
Start: 2017-05-09 — End: 2017-05-23

## 2017-05-09 MED ORDER — OMEPRAZOLE 20 MG PO CPDR
20 | ORAL_CAPSULE | Freq: Two times a day (BID) | ORAL | 0 refills | Status: DC
Start: 2017-05-09 — End: 2017-05-30

## 2017-05-09 NOTE — Telephone Encounter (Signed)
Spoke with son- he speaks english- no interpreter services available. Patient is positive for H pylori. Clarithromycin, Amoxicillin, and Prilosec sent in. Please take full 2 week dose and follow up in office. Please call son to set up follow up appointment.

## 2017-05-09 NOTE — Telephone Encounter (Signed)
FOLLOW UP MADE.

## 2017-05-30 ENCOUNTER — Ambulatory Visit: Admit: 2017-05-30 | Payer: MEDICAID | Attending: Registered Nurse | Primary: Family

## 2017-05-30 DIAGNOSIS — B9681 Helicobacter pylori [H. pylori] as the cause of diseases classified elsewhere: Secondary | ICD-10-CM

## 2017-05-30 MED ORDER — OMEPRAZOLE 20 MG PO CPDR
20 | ORAL_CAPSULE | Freq: Two times a day (BID) | ORAL | 5 refills | Status: DC
Start: 2017-05-30 — End: 2017-06-09

## 2017-05-30 MED ORDER — ONDANSETRON 8 MG PO TBDP
8 | ORAL_TABLET | Freq: Three times a day (TID) | ORAL | 1 refills | Status: DC | PRN
Start: 2017-05-30 — End: 2017-06-09

## 2017-05-30 NOTE — Progress Notes (Signed)
[facility]  Clinic Note    Date: 05/30/2017                                               Subjective/Objective:     Chief Complaint   Patient presents with   . 1 Month Follow-Up     1 MONTH FOLLOW UP, H PYLORI TEST POSITIVE, GO OVER RESULTS, PAIN MUCH BETTER, STILL HAVING SMALL AMOUNT OF PAIN, PT CX OF BAD BREATH        HPI Patient presents for H Pylori follow up. Patient completed treatment on 05/23/17. States severe epigastric pain has resolved. Still has some pain from time to time- about 3 x per week- but not as severe. Still taking Protonix daily. Does have complaints of bad breath, nausea, and decreased appetite- food does not sound as good as it used to. Zofran helping for nausea. Denies fever, chills, vomiting, change in bowels, or other abdominal pain.        There are no active problems to display for this patient.      No past medical history on file.    No past surgical history on file.    Office Visit on 05/06/2017   Component Date Value Ref Range Status   . WBC 05/06/2017 7.6  4.0 - 11.0 K/uL Final   . RBC 05/06/2017 4.30  4.00 - 5.20 M/uL Final   . Hemoglobin 05/06/2017 12.9  12.0 - 16.0 g/dL Final   . Hematocrit 05/06/2017 39.0  36.0 - 48.0 % Final   . MCV 05/06/2017 90.8  80.0 - 100.0 fL Final   . MCH 05/06/2017 30.1  26.0 - 34.0 pg Final   . MCHC 05/06/2017 33.2  31.0 - 36.0 g/dL Final   . RDW 05/06/2017 12.6  12.4 - 15.4 % Final   . Platelets 05/06/2017 189  135 - 450 K/uL Final   . MPV 05/06/2017 11.5* 5.0 - 10.5 fL Final   . Neutrophils % 05/06/2017 64.0  % Final   . Lymphocytes % 05/06/2017 25.8  % Final   . Monocytes % 05/06/2017 7.8  % Final   . Eosinophils % 05/06/2017 2.0  % Final   . Basophils % 05/06/2017 0.4  % Final   . Neutrophils # 05/06/2017 4.8  1.7 - 7.7 K/uL Final   . Lymphocytes # 05/06/2017 2.0  1.0 - 5.1 K/uL Final   . Monocytes # 05/06/2017 0.6  0.0 - 1.3 K/uL Final   . Eosinophils # 05/06/2017 0.2  0.0 - 0.6 K/uL Final   . Basophils # 05/06/2017 0.0  0.0 - 0.2  K/uL Final   . Sodium 05/06/2017 141  136 - 145 mmol/L Final   . Potassium 05/06/2017 4.5  3.5 - 5.1 mmol/L Final   . Chloride 05/06/2017 100  99 - 110 mmol/L Final   . CO2 05/06/2017 28  21 - 32 mmol/L Final   . Anion Gap 05/06/2017 13  3 - 16 Final   . Glucose 05/06/2017 86  70 - 99 mg/dL Final   . BUN 05/06/2017 10  7 - 20 mg/dL Final   . CREATININE 05/06/2017 <0.5* 0.6 - 1.1 mg/dL Final   . GFR Non-African American 05/06/2017 >60  >60 Final    Comment: >60 mL/min/1.32m EGFR, calc. for ages 169and older using the  MDRD formula (not corrected  for weight), is valid for stable  renal function.     . GFR African American 05/06/2017 >60  >60 Final    Comment: Chronic Kidney Disease: less than 60 ml/min/1.73 sq.m.          Kidney Failure: less than 15 ml/min/1.73 sq.m.  Results valid for patients 18 years and older.     . Calcium 05/06/2017 9.8  8.3 - 10.6 mg/dL Final   . Total Protein 05/06/2017 8.1  6.4 - 8.2 g/dL Final   . Alb 05/06/2017 4.5  3.4 - 5.0 g/dL Final   . Albumin/Globulin Ratio 05/06/2017 1.3  1.1 - 2.2 Final   . Total Bilirubin 05/06/2017 0.7  0.0 - 1.0 mg/dL Final   . Alkaline Phosphatase 05/06/2017 72  40 - 129 U/L Final   . ALT 05/06/2017 13  10 - 40 U/L Final   . AST 05/06/2017 18  15 - 37 U/L Final   . Globulin 05/06/2017 3.6  g/dL Final   . Lipase 05/06/2017 28.0  13.0 - 60.0 U/L Final   . Amylase 05/06/2017 62  25 - 115 U/L Final   . T4 Free 05/06/2017 1.4  0.9 - 1.8 ng/dL Final   . TSH 05/06/2017 7.60* 0.27 - 4.20 uIU/mL Final   . D-Dimer, Quant 05/06/2017 327* 0 - 229 ng/mL DDU Final    Comment: HemosIL D-Dimer is an automated latex enhanced immunoassay for  the quantitative determination of D-dimer in human citrated  plasma on IL Coagulation systems for use in conjunction with a  clinical pretest probability(PTP) assessment model to exclude  venous thromboembolism(VTE) in outpatients suspected of deep  venous thrombosis (DVT) and pulmonary embolism(PE). The  reference range for D-Dimer is  <230 ng/ml DDU. Results <230 ng/ml  DDU can be used as a negative predictor in patients with low  and moderate probability of DVT/PE. D-Dimer levels are mainly  elevated in cases of DVT/PE.  Other conditions may lead to a  high D-Dimer level including old age, pregnancy, peripheral  arteriopathy, DIC, coronary disease, thrombolytic treatment,  cancer, liver disease, infection, inflammation, hematoma, and  rheumatoid arthritis.  Specimen interferences are created by  lipemia, bilirubin, and hemolysis.     . CRP High Sensitivity 05/06/2017 13.70* 0.16 - 3.00 mg/L Final    Comment: Cardiovascular Risk:    <1.0 mg/L.........Marland KitchenLow risk  1.0 - 3.0 mg/L...Marland KitchenMarland KitchenAverage risk  >3.0 mg/L.........Marland KitchenHigh risk    Patients with higher hs-CRP concentrations are more likely to develop  stroke, myocardial infarction, and severe peripheral vascular disease.    CRP is a nonspecific marker of inflammation, and a variety of conditions  other than atherosclerosis may cause elevated levels.     . Sed Rate 05/06/2017 48* 0 - 20 mm/Hr Final   . H Pylori Breath Test 05/06/2017 Positive* Negative Final    Comment: INTERPRETIVE INFORMATION: H. pylori Breath Test  A negative result does not rule out the possibility of H.pylori  infection.  If clinical signs are suggestive of H. pylori infection, retest with a  new  specimen or an alternate method.  Known causes of false-negative results include:  1.  Use of antibiotics, proton pump inhibitors, and bismuth      preparations during the preceding 2 weeks.  2.  Administration of the breath test less than 4 weeks      after completion of definitive therapy to eradicate      H. pylori.  3.  Premature or late collection of the post-dose specimen.  Known  causes of false-positive results include:  1.  Patients with achlorhydria.  2.  Rinsing the testing solution in the mouth, which can      allow contact with urease-positive bacteria.  3.  The presence of other gastric spiral organisms such as       Helicobacter heilmanii.  Performed by [facility],  [address] [place] [address] [phone]  [email or web address], Julio Delgado, MD - Lab. Director         No family history on file.    Current Outpatient Prescriptions   Medication Sig Dispense Refill   . omeprazole (PRILOSEC) 20 MG delayed release capsule Take 1 capsule by mouth 2 times daily (before meals) for 14 days 28 capsule 0   . ondansetron (ZOFRAN ODT) 8 MG TBDP disintegrating tablet Take 1 tablet by mouth every 8 hours as needed for Nausea 12 tablet 1   . ibuprofen (ADVIL;MOTRIN) 800 MG tablet Take 1 tablet by mouth every 8 hours as needed for Pain 30 tablet 0   . sucralfate (CARAFATE) 1 GM/10ML suspension Take 10 mLs by mouth 4 times daily 1200 mL 3     No current facility-administered medications for this visit.        No Known Allergies    Review of Systems   Constitutional: Positive for appetite change. Negative for chills, diaphoresis, fatigue and fever.   Respiratory: Negative for cough, chest tightness, shortness of breath and wheezing.    Cardiovascular: Negative for chest pain and palpitations.   Gastrointestinal: Positive for abdominal pain (epigastric) and nausea. Negative for abdominal distention, anal bleeding, blood in stool, constipation, diarrhea, rectal pain and vomiting.   Neurological: Negative for dizziness, weakness, light-headedness, numbness and headaches.   All other systems reviewed and are negative.      Vitals:  BP 98/70 (Site: Right Upper Arm, Position: Sitting, Cuff Size: Medium Adult)   Pulse 86   Resp 12   Ht 5' (1.524 m)   Wt 126 lb 6.4 oz (57.3 kg)   LMP 04/29/2017 (Approximate)   SpO2 98%   Breastfeeding? No   BMI 24.69 kg/m     Physical Exam   Constitutional: She is oriented to person, place, and time. She appears well-developed and well-nourished.   HENT:   Head: Normocephalic and atraumatic.   Right Ear: Tympanic membrane, external ear and ear canal normal.   Left Ear: Tympanic membrane, external ear and  ear canal normal.   Nose: Nose normal.   Mouth/Throat: Uvula is midline, oropharynx is clear and moist and mucous membranes are normal. No tonsillar exudate.   Eyes: Pupils are equal, round, and reactive to light. Conjunctivae and EOM are normal.   Neck: Normal range of motion. Neck supple. No thyromegaly present.   Cardiovascular: Normal rate, regular rhythm, normal heart sounds and intact distal pulses.    Pulmonary/Chest: Effort normal and breath sounds normal.   Abdominal: Soft. Bowel sounds are normal. She exhibits no distension and no mass. There is no tenderness. There is no rebound and no guarding. No hernia.   Lymphadenopathy:     She has no cervical adenopathy.   Neurological: She is alert and oriented to person, place, and time.   Skin: Skin is warm and dry. Capillary refill takes less than 2 seconds.   Psychiatric: She has a normal mood and affect. Her behavior is normal. Judgment and thought content normal.   Nursing note and vitals reviewed.      Assessment/Plan     1. H pylori  ulcer  Continue wit Prilosec. Follow up with GI for endoscopy for further evaluation of epigastric pain. Refilled Zofran. Continue with bland diet. Try 6 small meals a day. Stay well hydrated with water. Will need to repeat H pylori testing in 4 weeks from completion of treatment- around 06/20/17.  - omeprazole (PRILOSEC) 20 MG delayed release capsule; Take 1 capsule by mouth 2 times daily (before meals)  Dispense: 60 capsule; Refill: 5  - Amb External Referral To Gastroenterology  - ondansetron (ZOFRAN ODT) 8 MG TBDP disintegrating tablet; Take 1 tablet by mouth every 8 hours as needed for Nausea  Dispense: 12 tablet; Refill: 1      Orders Placed This Encounter   Procedures   . Amb External Referral To Gastroenterology     Referral Priority:   Routine     Referral Type:   Consult for Advice and Opinion     Referral Reason:   Specialty Services Required     Referred to Provider:   Orvan Seen, MD     Requested Specialty:    Gastroenterology     Number of Visits Requested:   1       Return in about 10 days (around 06/09/2017) for complete physical and fasting labs.    Ernesta Amble, NP    05/30/2017  4:15 PM

## 2017-05-30 NOTE — Patient Instructions (Signed)
Patient Education        H. Pylori Bacterial Infection: Care Instructions  Your Care Instructions    Your test shows the presence of Helicobacter pylori ( H. pylori), a kind of bacterium that lives in the lining of the stomach. Many people have H. pylori in their stomachs and do not develop problems. But sometimes H. pylori causes an upset stomach or a sore (ulcer) in the stomach lining. Most stomach ulcers are caused by H. pylori. Symptoms of an ulcer include gnawing or burning pain in the belly that can last minutes or hours. Eating food or taking antacids helps relieve the pain, but the symptoms may come back after a while. Antibiotic medicine can cure an H. pylori infection.  Follow-up care is a key part of your treatment and safety. Be sure to make and go to all appointments, and call your doctor if you are having problems. It's also a good idea to know your test results and keep a list of the medicines you take.  How can you care for yourself at home?   Take your antibiotics as directed. Do not stop taking them just because you feel better. You need to take the full course of antibiotics.   If your doctor prescribes other medicine, take it exactly as prescribed. Call your doctor if you think you are having a problem with your medicine. You will get more details on the specific medicine your doctor prescribes.   Eat a healthy, balanced diet.  ? Eat smaller meals, and eat more often. Be sure to eat at least three meals a day.  ? Avoid heavily spiced or greasy foods.  ? Do not drink beverages that have caffeine if they bother your stomach. These include coffee, tea, and soda.   Do not smoke. Smoking slows the healing of your ulcer and can make an ulcer come back. If you need help quitting, talk to your doctor about stop-smoking programs and medicines. These can increase your chances of quitting for good.   Limit how much alcohol you drink. Alcohol can slow healing of an ulcer and can make your symptoms  worse.   Wash your hands after going to the bathroom.   Avoid aspirin, ibuprofen, or other anti-inflammatory medicines, because they can irritate the stomach. If you need pain medicine, try acetaminophen (Tylenol).  When should you call for help?  Call 911 anytime you think you may need emergency care. For example, call if:    You vomit blood or what looks like coffee grounds.     Your stools are maroon or very bloody.   Call your doctor now or seek immediate medical care if:    You have new or worse belly pain.     You are vomiting.     Your stools are black and look like tar, or they have streaks of blood.   Watch closely for changes in your health, and be sure to contact your doctor if:    You do not get better as expected.   Where can you learn more?  Go to [email or web address] and sign in to your MyChart account. Enter W698 in the Search Health Information box to learn more about "H. Pylori Bacterial Infection: Care Instructions."     If you do not have an account, please click on the "Sign Up Now" link.  Current as of: August 21, 2016  Content Version: 11.8   2006-2018 [employer]. Care instructions adapted under license by [facility]. If you   have questions about a medical condition or this instruction, always ask your healthcare professional. [employer], Inc[employer] disclaims any warranty or liability for your use of this information.

## 2017-06-09 ENCOUNTER — Ambulatory Visit: Admit: 2017-06-09 | Payer: MEDICAID | Attending: Registered Nurse | Primary: Family

## 2017-06-09 DIAGNOSIS — Z0000 Encounter for general adult medical examination without abnormal findings: Secondary | ICD-10-CM

## 2017-06-09 MED ORDER — SUCRALFATE 1 GM/10ML PO SUSP
1 | Freq: Four times a day (QID) | ORAL | 3 refills | Status: DC
Start: 2017-06-09 — End: 2017-08-01

## 2017-06-09 MED ORDER — TETANUS-DIPHTH-ACELL PERTUSSIS 5-2-15.5 LF-MCG/0.5 IM SUSP
Freq: Once | INTRAMUSCULAR | 0 refills | Status: AC
Start: 2017-06-09 — End: 2017-06-09

## 2017-06-09 MED ORDER — OMEPRAZOLE 20 MG PO CPDR
20 | ORAL_CAPSULE | Freq: Two times a day (BID) | ORAL | 5 refills | Status: DC
Start: 2017-06-09 — End: 2017-08-01

## 2017-06-09 MED ORDER — IBUPROFEN 800 MG PO TABS
800 | ORAL_TABLET | Freq: Three times a day (TID) | ORAL | 0 refills | Status: DC | PRN
Start: 2017-06-09 — End: 2017-10-01

## 2017-06-09 MED ORDER — ONDANSETRON 8 MG PO TBDP
8 | ORAL_TABLET | Freq: Three times a day (TID) | ORAL | 1 refills | Status: DC | PRN
Start: 2017-06-09 — End: 2017-08-01

## 2017-06-09 NOTE — Patient Instructions (Signed)
Patient Education        Well Visit, Ages 18 to 50: Care Instructions  Your Care Instructions    Physical exams can help you stay healthy. Your doctor has checked your overall health and may have suggested ways to take good care of yourself. He or she also may have recommended tests. At home, you can help prevent illness with healthy eating, regular exercise, and other steps.  Follow-up care is a key part of your treatment and safety. Be sure to make and go to all appointments, and call your doctor if you are having problems. It's also a good idea to know your test results and keep a list of the medicines you take.  How can you care for yourself at home?   Reach and stay at a healthy weight. This will lower your risk for many problems, such as obesity, diabetes, heart disease, and high blood pressure.   Get at least 30 minutes of physical activity on most days of the week. Walking is a good choice. You also may want to do other activities, such as running, swimming, cycling, or playing tennis or team sports. Discuss any changes in your exercise program with your doctor.   Do not smoke or allow others to smoke around you. If you need help quitting, talk to your doctor about stop-smoking programs and medicines. These can increase your chances of quitting for good.   Talk to your doctor about whether you have any risk factors for sexually transmitted infections (STIs). Having one sex partner (who does not have STIs and does not have sex with anyone else) is a good way to avoid these infections.   Use birth control if you do not want to have children at this time. Talk with your doctor about the choices available and what might be best for you.   Protect your skin from too much sun. When you're outdoors from 10 a.m. to 4 p.m., stay in the shade or cover up with clothing and a hat with a wide brim. Wear sunglasses that block UV rays. Even when it's cloudy, put broad-spectrum sunscreen (SPF 30 or higher) on any  exposed skin.   See a dentist one or two times a year for checkups and to have your teeth cleaned.   Wear a seat belt in the car.   Drink alcohol in moderation, if at all. That means no more than 2 drinks a day for men and 1 drink a day for women.  Follow your doctor's advice about when to have certain tests. These tests can spot problems early.  For everyone   Cholesterol. Have the fat (cholesterol) in your blood tested after age 20. Your doctor will tell you how often to have this done based on your age, family history, or other things that can increase your risk for heart disease.   Blood pressure. Have your blood pressure checked during a routine doctor visit. Your doctor will tell you how often to check your blood pressure based on your age, your blood pressure results, and other factors.   Vision. Talk with your doctor about how often to have a glaucoma test.   Diabetes. Ask your doctor whether you should have tests for diabetes.   Colon cancer. Have a test for colon cancer at age 50. You may have one of several tests. If you are younger than 50, you may need a test earlier if you have any risk factors. Risk factors include whether you already had a   precancerous polyp removed from your colon or whether your parent, brother, sister, or child has had colon cancer.  For women   Breast exam and mammogram. Talk to your doctor about when you should have a clinical breast exam and a mammogram. Medical experts differ on whether and how often women under 50 should have these tests. Your doctor can help you decide what is right for you.   Pap test and pelvic exam. Begin Pap tests at age 21. A Pap test is the best way to find cervical cancer. The test often is part of a pelvic exam. Ask how often to have this test.   Tests for sexually transmitted infections (STIs). Ask whether you should have tests for STIs. You may be at risk if you have sex with more than one person, especially if your partners do not wear  condoms.  For men   Tests for sexually transmitted infections (STIs). Ask whether you should have tests for STIs. You may be at risk if you have sex with more than one person, especially if you do not wear a condom.   Testicular cancer exam. Ask your doctor whether you should check your testicles regularly.   Prostate exam. Talk to your doctor about whether you should have a blood test (called a PSA test) for prostate cancer. Experts differ on whether and when men should have this test. Some experts suggest it if you are older than 45 and are African-American or have a father or brother who got prostate cancer when he was younger than 65.  When should you call for help?  Watch closely for changes in your health, and be sure to contact your doctor if you have any problems or symptoms that concern you.  Where can you learn more?  Go to [email or web address] and sign in to your MyChart account. Enter P072 in the Search Health Information box to learn more about "Well Visit, Ages 18 to 50: Care Instructions."     If you do not have an account, please click on the "Sign Up Now" link.  Current as of: August 22, 2016  Content Version: 11.8   2006-2018 [employer]. Care instructions adapted under license by [facility]. If you have questions about a medical condition or this instruction, always ask your healthcare professional. [employer], [employer] disclaims any warranty or liability for your use of this information.

## 2017-06-09 NOTE — Progress Notes (Signed)
Deborah Riley  Date of Birth:  1976-03-20    Date of Service:  06/09/2017    Chief Complaint:   Deborah Riley is a 42 y.o. female who presents for complete physical examination.    HPI: Patient presents for complete physical and fasting labs.  Son is present with patient at visit.    Has not yet followed up for scheduled with GI yet.  Still having epigastric pain from recent H. pylori infection.  Needs refill on Prilosec and Carafate.  Requesting refill on ibuprofen.    Needs gynecology referral.    Wt Readings from Last 3 Encounters:   06/09/17 130 lb (59 kg)   05/30/17 126 lb 6.4 oz (57.3 kg)   05/06/17 124 lb (56.2 kg)     BP Readings from Last 3 Encounters:   06/09/17 112/68   05/30/17 98/70   05/06/17 110/78       There is no problem list on file for this patient.      Preventive Care:  Health Maintenance   Topic Date Due   . HIV screen  05/27/1989   . DTaP/Tdap/Td vaccine (1 - Tdap) 05/27/1993   . Cervical cancer screen  05/28/1995   . Lipid screen  05/27/2014   . Flu vaccine (1) 01/25/2017        Hx abnormal PAP: no- needs GYN referral  Sexual activity: single partner, contraception - none   Self-breast exams: no  Last eye exam: none, patient has never had eyes checked  Exercise: none  Seatbelt use: yes  Calcium/vitamin D supplement: none  Tetanus: Due  Lipid panel: No results found for: TRIG, HDL, LDLCALC, LDLDIRECT   Living will:  unknown       There is no immunization history on file for this patient.    No Known Allergies  Current Outpatient Prescriptions   Medication Sig Dispense Refill   . omeprazole (PRILOSEC) 20 MG delayed release capsule Take 1 capsule by mouth 2 times daily (before meals) 60 capsule 5   . ondansetron (ZOFRAN ODT) 8 MG TBDP disintegrating tablet Take 1 tablet by mouth every 8 hours as needed for Nausea 12 tablet 1   . ibuprofen (ADVIL;MOTRIN) 800 MG tablet Take 1 tablet by mouth every 8 hours as needed for Pain 30 tablet 0   . sucralfate (CARAFATE) 1 GM/10ML suspension Take 10 mLs by  mouth 4 times daily 1200 mL 3     No current facility-administered medications for this visit.        No past medical history on file.  No past surgical history on file.  No family history on file.  Social History     Social History   . Marital status: Single     Spouse name: N/A   . Number of children: N/A   . Years of education: N/A     Occupational History   . Not on file.     Social History Main Topics   . Smoking status: Never Smoker   . Smokeless tobacco: Never Used   . Alcohol use No   . Drug use: No   . Sexual activity: Yes     Partners: Male      Comment: married     Other Topics Concern   . Not on file     Social History Narrative   . No narrative on file       Review of Systems:  A comprehensive review of systems was negative except for what was  noted in the HPI.     Physical Exam:   Vitals:    06/09/17 1633   BP: 112/68   Site: Left Upper Arm   Position: Sitting   Cuff Size: Medium Adult   Pulse: 62   Resp: 12   SpO2: 98%   Weight: 130 lb (59 kg)   Height: 5' (1.524 m)     Body mass index is 25.39 kg/m.   Constitutional: She is oriented to person, place, and time. She appears well-developed and well-nourished. No distress.   HEENT:   Head: Normocephalic and atraumatic.   Right Ear: Tympanic membrane, external ear and ear canal normal.   Left Ear: Tympanic membrane, external ear and ear canal normal.   Nose: Nose normal.   Mouth/Throat: Oropharynx is clear and moist, and mucous membranes are normal.  There is no cervical adenopathy.  Eyes: Conjunctivae and extraocular motions are normal. Pupils are equal, round, and reactive to light.   Neck: Neck supple. No JVD present. Carotid bruit is not present. No mass and no thyromegaly present.   Cardiovascular: Normal rate, regular rhythm, normal heart sounds and intact distal pulses.  Exam reveals no gallop and no friction rub.  No murmur heard.  Pulmonary/Chest: Effort normal and breath sounds normal. No respiratory distress. She has no wheezes, rhonchi or  rales.   Abdominal: Epigastric tenderness, but rest of abdomen Soft, non-tender. Bowel sounds and aorta are normal. She exhibits no organomegaly, mass or bruit.   Genitourinary:  examination not indicated.  Rectal: rectal not indicated by age criteria and lack of symptoms.  Breast exam:  not examined.  Musculoskeletal: Normal range of motion, no synovitis. She exhibits no edema.   Neurological: She is alert and oriented to person, place, and time. She has normal reflexes. No cranial nerve deficit. Coordination normal.   Skin: Skin is warm and dry. There is no rash or erythema.  No suspicious lesions noted.   Psychiatric: She has a normal mood and affect. Her speech is normal and behavior is normal. Judgment, cognition and memory are normal.     Lab Review:   Office Visit on 05/06/2017   Component Date Value   . WBC 05/06/2017 7.6    . RBC 05/06/2017 4.30    . Hemoglobin 05/06/2017 12.9    . Hematocrit 05/06/2017 39.0    . MCV 05/06/2017 90.8    . [facility]    . MCHC 05/06/2017 33.2    . RDW 05/06/2017 12.6    . Platelets 05/06/2017 189    . MPV 05/06/2017 11.5*   . Neutrophils % 05/06/2017 64.0    . Lymphocytes % 05/06/2017 25.8    . Monocytes % 05/06/2017 7.8    . Eosinophils % 05/06/2017 2.0    . Basophils % 05/06/2017 0.4    . Neutrophils # 05/06/2017 4.8    . Lymphocytes # 05/06/2017 2.0    . Monocytes # 05/06/2017 0.6    . Eosinophils # 05/06/2017 0.2    . Basophils # 05/06/2017 0.0    . Sodium 05/06/2017 141    . Potassium 05/06/2017 4.5    . Chloride 05/06/2017 100    . CO2 05/06/2017 28    . Anion Gap 05/06/2017 13    . Glucose 05/06/2017 86    . BUN 05/06/2017 10    . CREATININE 05/06/2017 <0.5*   . GFR Non-African American 05/06/2017 >60    . GFR African American 05/06/2017 >60    . Calcium  05/06/2017 9.8    . Total Protein 05/06/2017 8.1    . Alb 05/06/2017 4.5    . Albumin/Globulin Ratio 05/06/2017 1.3    . Total Bilirubin 05/06/2017 0.7    . Alkaline Phosphatase 05/06/2017 72    . ALT  05/06/2017 13    . AST 05/06/2017 18    . Globulin 05/06/2017 3.6    . Lipase 05/06/2017 28.0    . Amylase 05/06/2017 62    . T4 Free 05/06/2017 1.4    . TSH 05/06/2017 7.60*   . D-Dimer, Quant 05/06/2017 327*   . CRP High Sensitivity 05/06/2017 13.70*   . Sed Rate 05/06/2017 48*   . H Pylori Breath Test 05/06/2017 Positive*   Admission on 05/03/2017, Discharged on 05/04/2017   Component Date Value   . WBC 05/03/2017 13.3*   . RBC 05/03/2017 3.96*   . Hemoglobin 05/03/2017 11.8*   . Hematocrit 05/03/2017 35.9*   . MCV 05/03/2017 90.6    . [facility] 05/03/2017 29.9    . MCHC 05/03/2017 33.0    . RDW 05/03/2017 12.9    . Platelets 05/03/2017 145    . MPV 05/03/2017 10.9*   . Neutrophils % 05/03/2017 76.7    . Lymphocytes % 05/03/2017 12.9    . Monocytes % 05/03/2017 9.5    . Eosinophils % 05/03/2017 0.3    . Basophils % 05/03/2017 0.6    . Neutrophils # 05/03/2017 10.2*   . Lymphocytes # 05/03/2017 1.7    . Monocytes # 05/03/2017 1.3    . Eosinophils # 05/03/2017 0.0    . Basophils # 05/03/2017 0.1    . Sodium 05/03/2017 136    . Potassium 05/03/2017 3.3*   . Chloride 05/03/2017 98*   . CO2 05/03/2017 25    . Anion Gap 05/03/2017 13    . Glucose 05/03/2017 97    . BUN 05/03/2017 11    . CREATININE 05/03/2017 0.6    . GFR Non-African American 05/03/2017 >60    . GFR African American 05/03/2017 >60    . Calcium 05/03/2017 8.5    . Total Protein 05/03/2017 7.3    . Alb 05/03/2017 3.7    . Albumin/Globulin Ratio 05/03/2017 1.0*   . Total Bilirubin 05/03/2017 0.5    . Alkaline Phosphatase 05/03/2017 65    . ALT 05/03/2017 9*   . AST 05/03/2017 14*   . Globulin 05/03/2017 3.6    . Troponin 05/03/2017 <0.01    . Color, UA 05/03/2017 YELLOW    . Clarity, UA 05/03/2017 Clear    . Glucose, Ur 05/03/2017 Negative    . Bilirubin Urine 05/03/2017 Negative    . Ketones, Urine 05/03/2017 15*   . Specific Gravity, UA 05/03/2017 1.020    . Blood, Urine 05/03/2017 TRACE*   . pH, UA 05/03/2017 6.5    . Protein, UA 05/03/2017 Negative    .  Urobilinogen, Urine 05/03/2017 1.0    . Nitrite, Urine 05/03/2017 Negative    . Leukocyte Esterase, Urine 05/03/2017 Negative    . Microscopic Examination 05/03/2017 YES    . Urine Reflex to Culture 05/03/2017 Not Indicated    . Urine Type 05/03/2017 Clean catch    . HCG(Urine) Pregnancy Test 05/03/2017 Negative    . Pro-BNP 05/03/2017 144*   . Ventricular Rate 05/03/2017 80    . Atrial Rate 05/03/2017 80    . P-R Interval 05/03/2017 122    . QRS Duration 05/03/2017 70    . Q-T  Interval 05/03/2017 354    . QTc Calculation (Bazett) 05/03/2017 408    . P Axis 05/03/2017 37    . R Axis 05/03/2017 0    . T Axis 05/03/2017 -11    . Diagnosis 05/03/2017 Normal sinus rhythmPossible Left atrial enlargementRSR' or QR pattern in V1 suggests right ventricular conduction delayNonspecific ST and T wave abnormalityAbnormal ECGWhen compared with ECG of 15-Apr-2017 17:44,Nonspecific T wave abnormality now evident in Anterior leadsConfirmed by STRIET MD, JEFFREY ([id number]) on 05/04/2017 10:00:51 AM    . Hyaline Casts, UA 05/03/2017 2    . WBC, UA 05/03/2017 2    . RBC, UA 05/03/2017 3    . Epi Cells 05/03/2017 4    Admission on 04/15/2017, Discharged on 04/15/2017   Component Date Value   . Troponin 04/15/2017 <0.01    . WBC 04/15/2017 6.5    . RBC 04/15/2017 4.16    . Hemoglobin 04/15/2017 12.3    . Hematocrit 04/15/2017 38.2    . MCV 04/15/2017 91.8    . [facility] 04/15/2017 29.6    . MCHC 04/15/2017 32.2    . RDW 04/15/2017 13.1    . Platelets 04/15/2017 154    . MPV 04/15/2017 11.3*   . Neutrophils % 04/15/2017 63.8    . Lymphocytes % 04/15/2017 21.3    . Monocytes % 04/15/2017 12.5    . Eosinophils % 04/15/2017 1.9    . Basophils % 04/15/2017 0.5    . Neutrophils # 04/15/2017 4.2    . Lymphocytes # 04/15/2017 1.4    . Monocytes # 04/15/2017 0.8    . Eosinophils # 04/15/2017 0.1    . Basophils # 04/15/2017 0.0    . Sodium 04/15/2017 139    . Potassium 04/15/2017 4.0    . Chloride 04/15/2017 102    . CO2 04/15/2017 26    . Anion Gap  04/15/2017 11    . Glucose 04/15/2017 91    . BUN 04/15/2017 11    . CREATININE 04/15/2017 0.6    . GFR Non-African American 04/15/2017 >60    . GFR African American 04/15/2017 >60    . Calcium 04/15/2017 9.1    . Total Protein 04/15/2017 7.6    . Alb 04/15/2017 4.0    . Albumin/Globulin Ratio 04/15/2017 1.1    . Total Bilirubin 04/15/2017 0.6    . Alkaline Phosphatase 04/15/2017 54    . ALT 04/15/2017 18    . AST 04/15/2017 21    . Globulin 04/15/2017 3.6    . Lipase 04/15/2017 37.0    . HCG(Urine) Pregnancy Test 04/15/2017 Negative    . Color, UA 04/15/2017 YELLOW    . Clarity, UA 04/15/2017 CLOUDY*   . Glucose, Ur 04/15/2017 Negative    . Bilirubin Urine 04/15/2017 Negative    . Ketones, Urine 04/15/2017 Negative    . Specific Gravity, UA 04/15/2017 1.010    . Blood, Urine 04/15/2017 Negative    . pH, UA 04/15/2017 6.5    . Protein, UA 04/15/2017 Negative    . Urobilinogen, Urine 04/15/2017 0.2    . Nitrite, Urine 04/15/2017 Negative    . Leukocyte Esterase, Urine 04/15/2017 Negative    . Microscopic Examination 04/15/2017 YES    . Urine Reflex to Culture 04/15/2017 Not Indicated    . Urine Type 04/15/2017 Not Specified    . Bacteria, UA 04/15/2017 1+*   . Hyaline Casts, UA 04/15/2017 1    . WBC, UA 04/15/2017 4    .  RBC, UA 04/15/2017 1    . Epi Cells 04/15/2017 12*   . Ventricular Rate 04/15/2017 75    . Atrial Rate 04/15/2017 75    . P-R Interval 04/15/2017 140    . QRS Duration 04/15/2017 78    . Q-T Interval 04/15/2017 370    . QTc Calculation (Bazett) 04/15/2017 413    . P Axis 04/15/2017 60    . R Axis 04/15/2017 11    . T Axis 04/15/2017 32    . Diagnosis 04/15/2017 Normal sinus rhythm with sinus arrhythmiaNormal ECGConfirmed by HAQ MD, Laurena Spies [phone]) on 04/16/2017 8:18:20 AM         Assessment/Plan:    Abby Potash was seen today for annual exam.    Diagnoses and all orders for this visit:    Wellness examination- Check CMP, lipids, and Vit D. given wellness education.  Educated to increase cardio  exercise-walking 15 minutes at least 3 times a day.  Will follow-up based on lab results.  -     Comprehensive Metabolic Panel; Future  -     Lipid Panel; Future  -     Vitamin D 25 Hydroxy; Future  -     Comprehensive Metabolic Panel  -     Lipid Panel  -     Vitamin D 25 Hydroxy    Cervical cancer screening  -     Ambulatory referral to Gynecology    Need for Tdap vaccination- completed at pharmacy due to insurance.  -     Tdap (ADACEL) 09-25-13.5 LF-MCG/0.5 injection; Inject 0.5 mLs into the muscle once for 1 dose    Encounter for screening for HIV  -     HIV Screen; Future  -     HIV Screen    H pylori ulcer- refilled Prilosec Carafate and Zofran.  Educated patient to hold off on ibuprofen-could be causing ulcer- potentially making things worse.  Can use Tylenol for pain p.r.n. educated son and he needs to call and get patient appointment with GI immediately-patient likely needs endoscopy.  -     omeprazole (PRILOSEC) 20 MG delayed release capsule; Take 1 capsule by mouth 2 times daily (before meals)  -     sucralfate (CARAFATE) 1 GM/10ML suspension; Take 10 mLs by mouth 4 times daily  -     ondansetron (ZOFRAN ODT) 8 MG TBDP disintegrating tablet; Take 1 tablet by mouth every 8 hours as needed for Nausea    Return in about 1 year (around 06/09/2018) for Annual exam.

## 2017-06-10 ENCOUNTER — Encounter

## 2017-06-10 LAB — COMPREHENSIVE METABOLIC PANEL
ALT: 19 U/L (ref 10–40)
AST: 24 U/L (ref 15–37)
Albumin/Globulin Ratio: 1.2 (ref 1.1–2.2)
Albumin: 4.2 g/dL (ref 3.4–5.0)
Alkaline Phosphatase: 70 U/L (ref 40–129)
Anion Gap: 14 (ref 3–16)
BUN: 7 mg/dL (ref 7–20)
CO2: 27 mmol/L (ref 21–32)
Calcium: 9.7 mg/dL (ref 8.3–10.6)
Chloride: 100 mmol/L (ref 99–110)
Creatinine: 0.5 mg/dL — ABNORMAL LOW (ref 0.6–1.1)
GFR African American: >60 (ref >60)
GFR Non-African American: >60 (ref >60)
Globulin: 3.5 g/dL
Glucose: 77 mg/dL (ref 70–99)
Potassium: 4.1 mmol/L (ref 3.5–5.1)
Sodium: 141 mmol/L (ref 136–145)
Total Bilirubin: 0.7 mg/dL (ref 0.0–1.0)
Total Protein: 7.7 g/dL (ref 6.4–8.2)

## 2017-06-10 LAB — HIV SCREEN
HIV ANTIGEN: NONREACTIVE
HIV Ag/Ab: NONREACTIVE
HIV-1 Antibody: NONREACTIVE
HIV-2 Ab: NONREACTIVE

## 2017-06-10 LAB — LIPID PANEL
Cholesterol, Total: 183 mg/dL (ref 0–199)
HDL: 51 mg/dL (ref 40–60)
LDL Calculated: 112 mg/dL — ABNORMAL HIGH (ref <100)
Triglycerides: 100 mg/dL (ref 0–150)
VLDL Cholesterol Calculated: 20 mg/dL

## 2017-06-10 LAB — VITAMIN D 25 HYDROXY: Vit D, 25-Hydroxy: 7.5 ng/mL — ABNORMAL LOW (ref >=30)

## 2017-06-11 MED ORDER — VITAMIN D (ERGOCALCIFEROL) 1.25 MG (50000 UT) PO CAPS
1.25 MG (50000 UT) | ORAL_CAPSULE | ORAL | 0 refills | Status: DC
Start: 2017-06-11 — End: 2019-08-26

## 2017-06-19 NOTE — Telephone Encounter (Signed)
PT CALLING FOR [name] TO REFER HER TO SOMEONE ELSE - GI DOCTOR - THE ONE YOU RECOMMENDED DOESN'T  TAKE [employer]UR[employer]    PT @  [phone]

## 2017-06-19 NOTE — Telephone Encounter (Signed)
Please find GI covered by [employer]- language barrier.

## 2017-06-19 NOTE — Telephone Encounter (Signed)
PT SON COMING TOMORROW TO PICK UP NEW REFERRAL AND NUMBER INFORMATION.

## 2017-07-25 ENCOUNTER — Encounter: Payer: MEDICAID | Attending: Registered Nurse | Primary: Family

## 2017-08-01 ENCOUNTER — Ambulatory Visit: Admit: 2017-08-01 | Discharge: 2017-08-01 | Payer: MEDICAID | Attending: Registered Nurse | Primary: Family

## 2017-08-01 DIAGNOSIS — R1011 Right upper quadrant pain: Secondary | ICD-10-CM

## 2017-08-01 LAB — CBC WITH AUTO DIFFERENTIAL
Basophils %: 0.5 %
Basophils Absolute: 0 10*3/uL (ref 0.0–0.2)
Eosinophils %: 1.7 %
Eosinophils Absolute: 0.2 10*3/uL (ref 0.0–0.6)
Hematocrit: 40.3 % (ref 36.0–48.0)
Hemoglobin: 13.2 g/dL (ref 12.0–16.0)
Lymphocytes %: 33.6 %
Lymphocytes Absolute: 3 10*3/uL (ref 1.0–5.1)
MCH: 30.1 pg (ref 26.0–34.0)
MCHC: 32.7 g/dL (ref 31.0–36.0)
MCV: 92.2 fL (ref 80.0–100.0)
MPV: 11.3 fL — ABNORMAL HIGH (ref 5.0–10.5)
Monocytes %: 9.8 %
Monocytes Absolute: 0.9 10*3/uL (ref 0.0–1.3)
Neutrophils %: 54.4 %
Neutrophils Absolute: 4.9 10*3/uL (ref 1.7–7.7)
Platelets: 196 10*3/uL (ref 135–450)
RBC: 4.38 M/uL (ref 4.00–5.20)
RDW: 13.3 % (ref 12.4–15.4)
WBC: 9 10*3/uL (ref 4.0–11.0)

## 2017-08-01 LAB — COMPREHENSIVE METABOLIC PANEL
ALT: 11 U/L (ref 10–40)
AST: 17 U/L (ref 15–37)
Albumin/Globulin Ratio: 1.2 (ref 1.1–2.2)
Albumin: 4.1 g/dL (ref 3.4–5.0)
Alkaline Phosphatase: 59 U/L (ref 40–129)
Anion Gap: 12 (ref 3–16)
BUN: 14 mg/dL (ref 7–20)
CO2: 24 mmol/L (ref 21–32)
Calcium: 9.2 mg/dL (ref 8.3–10.6)
Chloride: 98 mmol/L — ABNORMAL LOW (ref 99–110)
Creatinine: 0.5 mg/dL — ABNORMAL LOW (ref 0.6–1.1)
GFR African American: >60 (ref >60)
GFR Non-African American: >60 (ref >60)
Globulin: 3.5 g/dL
Glucose: 94 mg/dL (ref 70–99)
Potassium: 3.7 mmol/L (ref 3.5–5.1)
Sodium: 134 mmol/L — ABNORMAL LOW (ref 136–145)
Total Bilirubin: 0.4 mg/dL (ref 0.0–1.0)
Total Protein: 7.6 g/dL (ref 6.4–8.2)

## 2017-08-01 LAB — AMYLASE: Amylase: 81 U/L (ref 25–115)

## 2017-08-01 LAB — SEDIMENTATION RATE: Sed Rate: 26 mm/Hr — ABNORMAL HIGH (ref 0–20)

## 2017-08-01 LAB — LIPASE: Lipase: 31 U/L (ref 13.0–60.0)

## 2017-08-01 LAB — VITAMIN D 25 HYDROXY: Vit D, 25-Hydroxy: 15.9 ng/mL — ABNORMAL LOW (ref >=30)

## 2017-08-01 LAB — C-REACTIVE PROTEIN HIGH SENSITIVITY: CRP High Sensitivity: 1.35 mg/L (ref 0.16–3.00)

## 2017-08-01 MED ORDER — SUCRALFATE 1 GM/10ML PO SUSP
1 GM/0ML | Freq: Four times a day (QID) | ORAL | 3 refills | Status: DC
Start: 2017-08-01 — End: 2018-02-07

## 2017-08-01 MED ORDER — OMEPRAZOLE 20 MG PO CPDR
20 MG | ORAL_CAPSULE | Freq: Two times a day (BID) | ORAL | 5 refills | Status: DC
Start: 2017-08-01 — End: 2017-11-10

## 2017-08-01 MED ORDER — ONDANSETRON 8 MG PO TBDP
8 MG | ORAL_TABLET | Freq: Three times a day (TID) | ORAL | 1 refills | Status: DC | PRN
Start: 2017-08-01 — End: 2018-02-07

## 2017-08-01 NOTE — Patient Instructions (Signed)
Patient Education        H. Pylori Bacterial Infection: Care Instructions  Your Care Instructions    Your test shows the presence of Helicobacter pylori ( H. pylori), a kind of bacterium that lives in the lining of the stomach. Many people have H. pylori in their stomachs and do not develop problems. But sometimes H. pylori causes an upset stomach or a sore (ulcer) in the stomach lining. Most stomach ulcers are caused by H. pylori. Symptoms of an ulcer include gnawing or burning pain in the belly that can last minutes or hours. Eating food or taking antacids helps relieve the pain, but the symptoms may come back after a while. Antibiotic medicine can cure an H. pylori infection.  Follow-up care is a key part of your treatment and safety. Be sure to make and go to all appointments, and call your doctor if you are having problems. It's also a good idea to know your test results and keep a list of the medicines you take.  How can you care for yourself at home?  ?? Take your antibiotics as directed. Do not stop taking them just because you feel better. You need to take the full course of antibiotics.  ?? If your doctor prescribes other medicine, take it exactly as prescribed. Call your doctor if you think you are having a problem with your medicine. You will get more details on the specific medicine your doctor prescribes.  ?? Eat a healthy, balanced diet.  ? Eat smaller meals, and eat more often. Be sure to eat at least three meals a day.  ? Avoid heavily spiced or greasy foods.  ? Do not drink beverages that have caffeine if they bother your stomach. These include coffee, tea, and soda.  ?? Do not smoke. Smoking slows the healing of your ulcer and can make an ulcer come back. If you need help quitting, talk to your doctor about stop-smoking programs and medicines. These can increase your chances of quitting for good.  ?? Limit how much alcohol you drink. Alcohol can slow healing of an ulcer and can make your symptoms  worse.  ?? Wash your hands after going to the bathroom.  ?? Avoid aspirin, ibuprofen, or other anti-inflammatory medicines, because they can irritate the stomach. If you need pain medicine, try acetaminophen (Tylenol).  When should you call for help?  Call 911 anytime you think you may need emergency care. For example, call if:  ?? ?? You vomit blood or what looks like coffee grounds.   ?? ?? Your stools are maroon or very bloody.   ??Call your doctor now or seek immediate medical care if:  ?? ?? You have new or worse belly pain.   ?? ?? You are vomiting.   ?? ?? Your stools are black and look like tar, or they have streaks of blood.   ??Watch closely for changes in your health, and be sure to contact your doctor if:  ?? ?? You do not get better as expected.   Where can you learn more?  Go to [email or web address] and sign in to your MyChart account. Enter W698 in the Search Health Information box to learn more about "H. Pylori Bacterial Infection: Care Instructions."     If you do not have an account, please click on the "Sign Up Now" link.  Current as of: August 20, 2016  Content Version: 11.9  ?? 2006-2018 [employer]. Care instructions adapted under license by [facility]. If you   have questions about a medical condition or this instruction, always ask your healthcare professional. [employer] disclaims any warranty or liability for your use of this information.

## 2017-08-01 NOTE — Progress Notes (Signed)
[facility]  Clinic Note    Date: 08/01/2017                                               Subjective/Objective:     Chief Complaint   Patient presents with   ??? Abdominal Pain     POSSIBLE GALLBLADDER PROBLEMS, ONGOING FOR MONTHS        HPI Patient presents to discuss chronic abdominal pain. Located in RUQ and epigastric area. Was treated for H Pylori in December 2018. Patient was given antibiotics, PPI and Carafate. Per patient, she completed antibiotics and continued PPI and Carafate until she ran out of it. She did not contact pharmacy or office for refills on her PPI and Carafate. Has been out of the medication for almost a month now per patient. States the abdominal pain is back. She thinks it is her gallbladder her sister had the same issue. Abdominal CT completed in December 2018 showed a normal gallbladder and liver. Patient states pain comes and goes. Unsure of any exacerbating or alleviating factors. Describes as ache and burn. Unsure how long it lasts- varies. Has not tried to treat pain with anything else besides prescribed medications. Has nausea, no vomiting. Denies peptic ulcer disease, hiatal hernia, diarrhea, jaundice, dysphagia, GI bleeding, vomiting, constipation and fever/chills. Patient was referred to GI for this issue on January 4. She never called and made appointment. States she needs [place] to make appointment for her.    Patient's son dropped her off at visit, but refused to come back with her. Patient speaks no english. Son completed a note stating that patient needs a letter to the government that patient cannot work because she has abdominal pain. Phone interpreter services used.         Patient Active Problem List    Diagnosis Date Noted   ??? Vitamin D deficiency 06/10/2017       Past Medical History:   Diagnosis Date   ??? H. pylori infection        No past surgical history on file.    Office Visit on 06/09/2017   Component Date Value Ref Range Status   ??? Sodium 06/09/2017 141   136 - 145 mmol/L Final   ??? Potassium 06/09/2017 4.1  3.5 - 5.1 mmol/L Final   ??? Chloride 06/09/2017 100  99 - 110 mmol/L Final   ??? CO2 06/09/2017 27  21 - 32 mmol/L Final   ??? Anion Gap 06/09/2017 14  3 - 16 Final   ??? Glucose 06/09/2017 77  70 - 99 mg/dL Final   ??? BUN 06/09/2017 7  7 - 20 mg/dL Final   ??? CREATININE 06/09/2017 0.5* 0.6 - 1.1 mg/dL Final   ??? GFR Non-African American 06/09/2017 >60  >60 Final    Comment: >60 mL/min/1.93m EGFR, calc. for ages 170and older using the  MDRD formula (not corrected for weight), is valid for stable  renal function.     ??? GFR African American 06/09/2017 >60  >60 Final    Comment: Chronic Kidney Disease: less than 60 ml/min/1.73 sq.m.          Kidney Failure: less than 15 ml/min/1.73 sq.m.  Results valid for patients 18 years and older.     ??? Calcium 06/09/2017 9.7  8.3 - 10.6 mg/dL Final   ??? Total Protein 06/09/2017  7.7  6.4 - 8.2 g/dL Final   ??? Alb 06/09/2017 4.2  3.4 - 5.0 g/dL Final   ??? Albumin/Globulin Ratio 06/09/2017 1.2  1.1 - 2.2 Final   ??? Total Bilirubin 06/09/2017 0.7  0.0 - 1.0 mg/dL Final   ??? Alkaline Phosphatase 06/09/2017 70  40 - 129 U/L Final   ??? ALT 06/09/2017 19  10 - 40 U/L Final   ??? AST 06/09/2017 24  15 - 37 U/L Final   ??? Globulin 06/09/2017 3.5  g/dL Final   ??? Cholesterol, Total 06/09/2017 183  0 - 199 mg/dL Final   ??? Triglycerides 06/09/2017 100  0 - 150 mg/dL Final   ??? HDL 06/09/2017 51  40 - 60 mg/dL Final   ??? LDL Calculated 06/09/2017 112* <100 mg/dL Final   ??? VLDL Cholesterol Calculated 06/09/2017 20  Not Established mg/dL Final   ??? Vit D, 25-Hydroxy 06/09/2017 7.5* >=30 ng/mL Final    Comment: <=20 ng/mL............Marland KitchenDeficient  21-29 ng/mL..........Marland KitchenInsufficient  >=30 ng/mL.........Marland KitchenSufficient     ??? HIV Ag/Ab 06/09/2017 Non-Reactive  Non-reactive Final   ??? HIV-1 Antibody 06/09/2017 Non-Reactive  Non-reactive Final   ??? HIV ANTIGEN 06/09/2017 Non-Reactive  Non-reactive Final   ??? HIV-2 Ab 06/09/2017 Non-Reactive  Non-reactive Final       No family  history on file.    Current Outpatient Medications   Medication Sig Dispense Refill   ??? vitamin D (ERGOCALCIFEROL) 50000 units CAPS capsule Take 1 capsule by mouth once a week 12 capsule 0   ??? omeprazole (PRILOSEC) 20 MG delayed release capsule Take 1 capsule by mouth 2 times daily (before meals) 60 capsule 5   ??? sucralfate (CARAFATE) 1 GM/10ML suspension Take 10 mLs by mouth 4 times daily 1200 mL 3   ??? ibuprofen (ADVIL;MOTRIN) 800 MG tablet Take 1 tablet by mouth every 8 hours as needed for Pain 30 tablet 0   ??? ondansetron (ZOFRAN ODT) 8 MG TBDP disintegrating tablet Take 1 tablet by mouth every 8 hours as needed for Nausea 12 tablet 1     No current facility-administered medications for this visit.        No Known Allergies    Review of Systems   Constitutional: Negative for chills, diaphoresis, fatigue and fever.   Respiratory: Negative for cough, chest tightness, shortness of breath and wheezing.    Cardiovascular: Negative for chest pain and palpitations.   Gastrointestinal: Positive for abdominal pain and nausea. Negative for abdominal distention, anal bleeding, blood in stool, constipation, diarrhea, rectal pain and vomiting.   Neurological: Negative for dizziness, weakness, light-headedness, numbness and headaches.   All other systems reviewed and are negative.      Vitals:  BP 98/70 (Site: Left Upper Arm, Position: Sitting, Cuff Size: Medium Adult)    Pulse 60    Resp 24    Ht 5' (1.524 m)    Wt 134 lb (60.8 kg)    LMP 07/25/2017    Breastfeeding? No    BMI 26.17 kg/m??     Physical Exam   Constitutional: She is oriented to person, place, and time. She appears well-developed and well-nourished.   HENT:   Head: Normocephalic and atraumatic.   Right Ear: Tympanic membrane, external ear and ear canal normal.   Left Ear: Tympanic membrane, external ear and ear canal normal.   Nose: Nose normal.   Mouth/Throat: Uvula is midline, oropharynx is clear and moist and mucous membranes are normal.   Eyes: Pupils are  equal, round, and  reactive to light. Conjunctivae and EOM are normal.   Neck: Normal range of motion. Neck supple. No thyromegaly present.   Cardiovascular: Normal rate, regular rhythm, normal heart sounds and intact distal pulses.   Pulmonary/Chest: Effort normal and breath sounds normal.   Abdominal: Soft. Normal appearance and bowel sounds are normal. She exhibits no shifting dullness, no distension, no pulsatile liver, no fluid wave, no abdominal bruit, no ascites, no pulsatile midline mass and no mass. There is no hepatosplenomegaly. There is tenderness in the right upper quadrant and epigastric area. There is no rigidity, no rebound, no guarding, no CVA tenderness, no tenderness at McBurney's point and negative Murphy's sign. No hernia.   Lymphadenopathy:     She has no cervical adenopathy.   Neurological: She is alert and oriented to person, place, and time.   Skin: Skin is warm and dry. Capillary refill takes less than 2 seconds.   Psychiatric: She has a normal mood and affect. Her behavior is normal. Judgment and thought content normal.   Nursing note and vitals reviewed.      Assessment/Plan     1. RUQ abdominal pain  Will complete testing to further rule out gallbladder per patient request. Educated to stay on bland diet. Educated patient that this is not a condition that qualifies for disability, she is able to work. Unable to get in touch with her son, she states he does not have a phone. She has no ride home from visit. Informed patient that she is expected to make her own specialist and testing appointments. We have scheduled her GI appointment for her this ONE TIME, but she or a family member will have to call and schedule her other appointments and testing. Patient verbalized understanding to this. GI appointment is May 6th with Dr. Scheryl Darter.  - US Gallbladder Ruq; Future  - CBC Auto Differential; Future  - Comprehensive Metabolic Panel; Future  - CRP,High Sensitivity; Future  - Sedimentation Rate;  Future  - Lipase; Future  - Amylase; Future  - Amylase  - Lipase  - Sedimentation Rate  - CRP,High Sensitivity  - Comprehensive Metabolic Panel  - CBC Auto Differential    2. H pylori ulcer  Recheck H.pylori- confirm it is resolved. Refilled PPI and Carafate. Given education on H pylori.  - omeprazole (PRILOSEC) 20 MG delayed release capsule; Take 1 capsule by mouth 2 times daily (before meals)  Dispense: 60 capsule; Refill: 5  - sucralfate (CARAFATE) 1 GM/10ML suspension; Take 10 mLs by mouth 4 times daily  Dispense: 1200 mL; Refill: 3  - ondansetron (ZOFRAN ODT) 8 MG TBDP disintegrating tablet; Take 1 tablet by mouth every 8 hours as needed for Nausea  Dispense: 12 tablet; Refill: 1  - H PYLORI BREATH TEST; Future  - H PYLORI BREATH TEST    3. Vitamin D deficiency  Recheck Vit D.  - Vitamin D 25 Hydroxy      Orders Placed This Encounter   Procedures   ??? US Gallbladder Ruq     Standing Status:   Future     Standing Expiration Date:   08/02/2018     Order Specific Question:   Reason for exam:     Answer:   RUQ pain   ??? H PYLORI BREATH TEST     Standing Status:   Future     Number of Occurrences:   1     Standing Expiration Date:   08/01/2018   ??? CBC Auto Differential  Standing Status:   Future     Number of Occurrences:   1     Standing Expiration Date:   08/01/2018   ??? Comprehensive Metabolic Panel     Standing Status:   Future     Number of Occurrences:   1     Standing Expiration Date:   08/01/2018   ??? CRP,High Sensitivity     Standing Status:   Future     Number of Occurrences:   1     Standing Expiration Date:   08/01/2018   ??? Sedimentation Rate     Standing Status:   Future     Number of Occurrences:   1     Standing Expiration Date:   08/01/2018   ??? Lipase     Standing Status:   Future     Number of Occurrences:   1     Standing Expiration Date:   08/01/2018   ??? Amylase     Standing Status:   Future     Number of Occurrences:   1     Standing Expiration Date:   08/01/2018       Return if symptoms worsen or fail to  improve.    Ernesta Amble, NP    08/01/2017  8:40 AM

## 2017-08-04 LAB — H. PYLORI BREATH TEST: H Pylori Breath Test: NEGATIVE

## 2017-09-30 ENCOUNTER — Inpatient Hospital Stay
Admit: 2017-09-30 | Discharge: 2017-10-01 | Disposition: A | Discharge status: Other Acute Inpatient Hospital | Payer: MEDICAID | Attending: Emergency Medicine

## 2017-09-30 ENCOUNTER — Emergency Department: Admit: 2017-09-30 | Payer: MEDICAID | Primary: Family

## 2017-09-30 ENCOUNTER — Emergency Department: Payer: MEDICAID | Primary: Family

## 2017-09-30 DIAGNOSIS — R45851 Suicidal ideations: Secondary | ICD-10-CM

## 2017-09-30 LAB — COMPREHENSIVE METABOLIC PANEL
ALT: 13 U/L (ref 10–40)
AST: 18 U/L (ref 15–37)
Albumin/Globulin Ratio: 1.1 (ref 1.1–2.2)
Albumin: 4.1 g/dL (ref 3.4–5.0)
Alkaline Phosphatase: 65 U/L (ref 40–129)
Anion Gap: 11 (ref 3–16)
BUN: 12 mg/dL (ref 7–20)
CO2: 26 mmol/L (ref 21–32)
Calcium: 9.4 mg/dL (ref 8.3–10.6)
Chloride: 99 mmol/L (ref 99–110)
Creatinine: 0.6 mg/dL (ref 0.6–1.1)
GFR African American: >60 (ref >60)
GFR Non-African American: >60 (ref >60)
Globulin: 3.8 g/dL
Glucose: 104 mg/dL — ABNORMAL HIGH (ref 70–99)
Potassium: 3.4 mmol/L — ABNORMAL LOW (ref 3.5–5.1)
Sodium: 136 mmol/L (ref 136–145)
Total Bilirubin: 0.7 mg/dL (ref 0.0–1.0)
Total Protein: 7.9 g/dL (ref 6.4–8.2)

## 2017-09-30 LAB — EKG 12-LEAD
Atrial Rate: 67 {beats}/min
P Axis: 40 degrees
P-R Interval: 150 ms
Q-T Interval: 392 ms
QRS Duration: 82 ms
QTc Calculation (Bazett): 414 ms
R Axis: 6 degrees
T Axis: 13 degrees
Ventricular Rate: 67 {beats}/min

## 2017-09-30 LAB — URINE DRUG SCREEN
Amphetamine Screen, Urine: NEGATIVE
Barbiturate Screen, Ur: NEGATIVE (ref <200)
Benzodiazepine Screen, Urine: NEGATIVE (ref <200)
Cannabinoid Scrn, Ur: NEGATIVE (ref <50)
Cocaine Metabolite Screen, Urine: NEGATIVE (ref <300)
Methadone Screen, Urine: NEGATIVE (ref <300)
Opiate Scrn, Ur: NEGATIVE (ref <300)
Oxycodone Urine: NEGATIVE (ref <100)
PCP Screen, Urine: NEGATIVE (ref <25)
Propoxyphene Scrn, Ur: NEGATIVE (ref <300)
pH, UA: 6.5

## 2017-09-30 LAB — CBC WITH AUTO DIFFERENTIAL
Basophils %: 0.7 %
Basophils Absolute: 0.1 10*3/uL (ref 0.0–0.2)
Eosinophils %: 1 %
Eosinophils Absolute: 0.1 10*3/uL (ref 0.0–0.6)
Hematocrit: 39 % (ref 36.0–48.0)
Hemoglobin: 12.8 g/dL (ref 12.0–16.0)
Lymphocytes %: 26.8 %
Lymphocytes Absolute: 2.2 10*3/uL (ref 1.0–5.1)
MCH: 29.8 pg (ref 26.0–34.0)
MCHC: 32.8 g/dL (ref 31.0–36.0)
MCV: 91 fL (ref 80.0–100.0)
MPV: 11 fL — ABNORMAL HIGH (ref 5.0–10.5)
Monocytes %: 7.9 %
Monocytes Absolute: 0.7 10*3/uL (ref 0.0–1.3)
Neutrophils %: 63.6 %
Neutrophils Absolute: 5.2 10*3/uL (ref 1.7–7.7)
Platelets: 185 10*3/uL (ref 135–450)
RBC: 4.28 M/uL (ref 4.00–5.20)
RDW: 13 % (ref 12.4–15.4)
WBC: 8.2 10*3/uL (ref 4.0–11.0)

## 2017-09-30 LAB — URINALYSIS WITH REFLEX TO CULTURE
Bilirubin Urine: NEGATIVE
Blood, Urine: NEGATIVE
Glucose, Ur: NEGATIVE mg/dL
Ketones, Urine: NEGATIVE mg/dL
Leukocyte Esterase, Urine: NEGATIVE
Nitrite, Urine: NEGATIVE
Protein, UA: NEGATIVE mg/dL
Specific Gravity, UA: <1.005 (ref 1.005–1.030)
Urobilinogen, Urine: 0.2 E.U./dL (ref <2.0)
pH, UA: 6.5 (ref 5.0–8.0)

## 2017-09-30 LAB — HCG, SERUM, QUALITATIVE: hCG Qual: NEGATIVE

## 2017-09-30 LAB — LIPASE: Lipase: 20 U/L (ref 13.0–60.0)

## 2017-09-30 LAB — BRAIN NATRIURETIC PEPTIDE: Pro-BNP: 20 pg/mL (ref 0–124)

## 2017-09-30 LAB — SALICYLATE LEVEL: Salicylate, Serum: <0.3 mg/dL — ABNORMAL LOW (ref 15.0–30.0)

## 2017-09-30 LAB — TROPONIN: Troponin: <0.01 ng/mL (ref <0.01)

## 2017-09-30 LAB — ETHANOL: Ethanol Lvl: NOT DETECTED mg/dL

## 2017-09-30 LAB — ACETAMINOPHEN LEVEL: Acetaminophen Level: <5 ug/mL — ABNORMAL LOW (ref 10–30)

## 2017-09-30 LAB — AMYLASE: Amylase: 63 U/L (ref 25–115)

## 2017-09-30 MED ORDER — MORPHINE SULFATE 4 MG/ML IJ SOLN
4 MG/ML | Freq: Once | INTRAMUSCULAR | Status: AC
Start: 2017-09-30 — End: 2017-09-30
  Administered 2017-09-30: 21:00:00 4 mg via INTRAVENOUS

## 2017-09-30 MED ORDER — SODIUM CHLORIDE 0.9 % IV BOLUS
0.9 % | Freq: Once | INTRAVENOUS | Status: AC
Start: 2017-09-30 — End: 2017-09-30
  Administered 2017-09-30: 20:00:00 1000 mL via INTRAVENOUS

## 2017-09-30 MED ORDER — POTASSIUM CHLORIDE CRYS ER 20 MEQ PO TBCR
20 MEQ | Freq: Once | ORAL | Status: AC
Start: 2017-09-30 — End: 2017-09-30
  Administered 2017-09-30: 22:00:00 20 meq via ORAL

## 2017-09-30 MED ORDER — LIDOCAINE VISCOUS 2 % MT SOLN
2 % | Freq: Once | OROMUCOSAL | Status: AC
Start: 2017-09-30 — End: 2017-09-30
  Administered 2017-09-30: 20:00:00 via ORAL

## 2017-09-30 MED ORDER — ONDANSETRON HCL 4 MG/2ML IJ SOLN
4 MG/2ML | Freq: Once | INTRAMUSCULAR | Status: AC
Start: 2017-09-30 — End: 2017-09-30
  Administered 2017-09-30: 20:00:00 4 mg via INTRAVENOUS

## 2017-09-30 MED ORDER — FAMOTIDINE 20 MG/2ML IV SOLN
20 MG/2ML | Freq: Once | INTRAVENOUS | Status: AC
Start: 2017-09-30 — End: 2017-09-30
  Administered 2017-09-30: 20:00:00 20 mg via INTRAVENOUS

## 2017-09-30 MED ORDER — IOPAMIDOL 76 % IV SOLN
76 % | Freq: Once | INTRAVENOUS | Status: AC | PRN
Start: 2017-09-30 — End: 2017-09-30
  Administered 2017-09-30: 21:00:00 75 mL via INTRAVENOUS

## 2017-09-30 MED FILL — MAG-AL PLUS 200-200-20 MG/5ML PO LIQD: 200-200-20 MG/5ML | ORAL | Qty: 30

## 2017-09-30 MED FILL — FAMOTIDINE 20 MG/2ML IV SOLN: 20 MG/2ML | INTRAVENOUS | Qty: 2

## 2017-09-30 MED FILL — SODIUM CHLORIDE 0.9 % IV SOLN: 0.9 % | INTRAVENOUS | Qty: 1000

## 2017-09-30 MED FILL — ONDANSETRON HCL 4 MG/2ML IJ SOLN: 4 MG/2ML | INTRAMUSCULAR | Qty: 2

## 2017-09-30 MED FILL — MORPHINE SULFATE 4 MG/ML IJ SOLN: 4 mg/mL | INTRAMUSCULAR | Qty: 1

## 2017-09-30 NOTE — ED Notes (Signed)
Dr. Carlos American at bedside to assess. ED tech April remains at bedside.      Zenaida Niece, RN  09/30/17 [phone]

## 2017-09-30 NOTE — ED Notes (Signed)
Pt. Triaged without interpreter phone per pt. Request.      Alfonse Flavors, RN  09/30/17 [phone]

## 2017-09-30 NOTE — ED Notes (Signed)
Pt remains in bed, ED tech April at bedside as constant observer/ safety companion. Family at bedside, denies needs.      Zenaida Niece, RN  09/30/17 [phone]

## 2017-09-30 NOTE — ED Notes (Signed)
Report called to Synetta Fail, Charity fundraiser at [facility]. All questions answered, agreeable to transfer. ETA [id number] via [facility].      Zenaida Niece, RN  09/30/17 [phone]

## 2017-09-30 NOTE — ED Provider Notes (Signed)
[facility] EMERGENCY DEPARTMENT  EMERGENCY DEPARTMENT ENCOUNTER        Pt Name: Deborah Riley  MRN: [id number]  Birthdate Sep 25, 1975  Date of evaluation: 09/30/2017  Provider: Verdon Cummins, PA  PCP: Jerrell Mylar, APRN - CNP    This patient was seen and evaluated by the attending physician [facility]      CHIEF COMPLAINT       Chief Complaint   Patient presents with   ??? Abdominal Pain     Pt. comes in with complaints of abdominal pain that has been going on for six months. Pt. had a medication that she used to take, but does not have it anymore. Pt. also complaining of feeling dizzy and having chest pain.       HISTORY OF PRESENT ILLNESS   (Location/Symptom, Timing/Onset, Context/Setting, Quality, Duration, Modifying Factors, Severity)  Note limiting factors.     Deborah Riley is a 42 y.o. female with past medical history of H. pylori who presents to the ED with complaint of abdominal pain.  Patient states she has upper abdominal pain that radiates into her chest.  Has been present for the past 6-7 months.  Patient states she was treated with a medication by her PCP but does not remember what it was.  Patient states the medication did not help.  Patient states continued pain to the upper abdomen with associated nausea and vomiting.  Denies aggravating or relieving factors.  States pain is described as a sharp stabbing rated 9/10.  Patient states pain radiates into her chest is a she feels dizzy.  Denies shortness of breath, pleuritic pain, orthopnea, pedal edema, calf tenderness, fever/chills, rashes/lesions, injury/trauma, urinary symptoms or changes in bowel movements.  Denies vaginal bleeding or vaginal discharge.  Became concerned due to persistent nature and came to the ED for further evaluation and treatment.  Daughter at bedside is the primary historian because patient speaks [place].  Apparently patient screen positive for suicidal ideation.  Daughter states patient complains of simply  harming herself due to the pain for the past couple months.  Did get Nepali interpreter and discussed with patient at length about this.  Patient states she does chronically have thoughts of harming herself over the past 3-4 months.  Patient states she feels like harming herself because the pain is not getting any better and states she cannot function with it.  Patient states she had to quit her job is no longer working.  Patient states she does not feel like she is getting any better and feels like the pain is chronic.  Patient states she feels like it is "incurable" and since she cannot do her daily functions at home or work she "wondering why she is even here".  Patient denies any specific plan or actual this time.  States she has had these thoughts and states she feels like with this pain it is not worth living and she does not want to be here.  Patient states she wishes she was "dead".  States she has had these thoughts in the past but denies any actions.  Denies any documented history of psychiatric illness in self or family.    Nursing Notes were all reviewed and agreed with or any disagreements were addressed  in the HPI.    REVIEW OF SYSTEMS    (2-9 systems for level 4, 10 or more for level 5)     Review of Systems   Constitutional: Negative for activity change, appetite change,  chills and fever.   Respiratory: Negative.  Negative for cough, chest tightness, shortness of breath, wheezing and stridor.    Cardiovascular: Positive for chest pain. Negative for palpitations and leg swelling.   Gastrointestinal: Positive for abdominal pain, nausea and vomiting. Negative for constipation and diarrhea.   Genitourinary: Negative for decreased urine volume, difficulty urinating, dysuria, flank pain, frequency, hematuria, pelvic pain, urgency, vaginal bleeding, vaginal discharge and vaginal pain.   Musculoskeletal: Negative for arthralgias, myalgias, neck pain and neck stiffness.   Skin: Negative for color change,  pallor, rash and wound.   Neurological: Positive for dizziness. Negative for light-headedness and headaches.       Positives and Pertinent negatives as per HPI.  Except as noted abovein the ROS, all other systems were reviewed and negative.       PAST MEDICAL HISTORY     Past Medical History:   Diagnosis Date   ??? H. pylori infection          SURGICAL HISTORY   History reviewed. No pertinent surgical history.      CURRENTMEDICATIONS       Previous Medications    IBUPROFEN (ADVIL;MOTRIN) 800 MG TABLET    Take 1 tablet by mouth every 8 hours as needed for Pain    OMEPRAZOLE (PRILOSEC) 20 MG DELAYED RELEASE CAPSULE    Take 1 capsule by mouth 2 times daily (before meals)    ONDANSETRON (ZOFRAN ODT) 8 MG TBDP DISINTEGRATING TABLET    Take 1 tablet by mouth every 8 hours as needed for Nausea    SUCRALFATE (CARAFATE) 1 GM/10ML SUSPENSION    Take 10 mLs by mouth 4 times daily    VITAMIN D (ERGOCALCIFEROL) 50000 UNITS CAPS CAPSULE    Take 1 capsule by mouth once a week         ALLERGIES     Patient has no known allergies.    FAMILYHISTORY     History reviewed. No pertinent family history.       SOCIAL HISTORY       Social History     Socioeconomic History   ??? Marital status: Single     Spouse name: None   ??? Number of children: None   ??? Years of education: None   ??? Highest education level: None   Occupational History   ??? None   Social Needs   ??? Financial resource strain: None   ??? Food insecurity:     Worry: None     Inability: None   ??? Transportation needs:     Medical: None     Non-medical: None   Tobacco Use   ??? Smoking status: Never Smoker   ??? Smokeless tobacco: Never Used   Substance and Sexual Activity   ??? Alcohol use: No   ??? Drug use: No   ??? Sexual activity: Yes     Partners: Male     Comment: married   Lifestyle   ??? Physical activity:     Days per week: None     Minutes per session: None   ??? Stress: None   Relationships   ??? Social connections:     Talks on phone: None     Gets together: None     Attends religious  service: None     Active member of club or organization: None     Attends meetings of clubs or organizations: None     Relationship status: None   ??? Intimate partner violence:  Fear of current or ex partner: None     Emotionally abused: None     Physically abused: None     Forced sexual activity: None   Other Topics Concern   ??? None   Social History Narrative   ??? None       SCREENINGS    Glasgow Coma Scale  Eye Opening: Spontaneous  Best Verbal Response: Oriented  Best Motor Response: Obeys commands  Glasgow Coma Scale Score: 15        PHYSICAL EXAM    (up to 7 for level 4, 8 or more for level 5)     ED Triage Vitals [09/30/17 1503]   BP Temp Temp Source Pulse Resp SpO2 Height Weight   114/84 98.8 ??F (37.1 ??C) Infrared 71 18 98 % 5' (1.524 m) 136 lb 1 oz (61.7 kg)       Physical Exam   Constitutional: She is oriented to person, place, and time. She appears well-developed and well-nourished. No distress.   HENT:   Head: Normocephalic and atraumatic.   Right Ear: External ear normal.   Left Ear: External ear normal.   Mouth/Throat: Oropharynx is clear and moist. No oropharyngeal exudate.   Eyes: Pupils are equal, round, and reactive to light. EOM are normal. Right eye exhibits no discharge. Left eye exhibits no discharge.   Neck: Normal range of motion. Neck supple. No JVD present.   Cardiovascular: Normal rate, regular rhythm, normal heart sounds and intact distal pulses. Exam reveals no gallop and no friction rub.   No murmur heard.  Pulmonary/Chest: Effort normal and breath sounds normal. No stridor. No respiratory distress. She has no wheezes. She has no rales. She exhibits no tenderness.   Abdominal: Soft. Bowel sounds are normal. She exhibits no distension and no mass. There is no hepatosplenomegaly. There is no tenderness. There is no rigidity, no rebound, no guarding, no CVA tenderness, no tenderness at McBurney's point and negative Murphy's sign. No hernia.   Musculoskeletal: Normal range of motion.    Lymphadenopathy:     She has no cervical adenopathy.   Neurological: She is alert and oriented to person, place, and time. She has normal strength. No cranial nerve deficit or sensory deficit. Gait normal. GCS eye subscore is 4. GCS verbal subscore is 5. GCS motor subscore is 6.   Skin: Skin is warm and dry. No rash noted. She is not diaphoretic. No erythema. No pallor.   Psychiatric: Judgment normal. She is withdrawn. Cognition and memory are normal. She exhibits a depressed mood. She expresses suicidal ideation. She expresses no homicidal ideation. She expresses no suicidal plans and no homicidal plans. She is noncommunicative.       DIAGNOSTIC RESULTS   LABS:    Labs Reviewed   COMPREHENSIVE METABOLIC PANEL - Abnormal; Notable for the following components:       Result Value    Potassium 3.4 (*)     Glucose 104 (*)     All other components within normal limits    Narrative:     Performed at:  [facility]  [address],  [place], [place] [address]   Phone [phone]   CBC WITH AUTO DIFFERENTIAL - Abnormal; Notable for the following components:    MPV 11.0 (*)     All other components within normal limits    Narrative:     Performed at:  [facility]  [address],  [place], [place]  [address]   Phone [phone]   ACETAMINOPHEN LEVEL - Abnormal; Notable for the following components:    Acetaminophen Level <5 (*)     All other components within normal limits    Narrative:     Performed at:  [facility]  [address]  [place], [place] [address]   Phone [phone]   SALICYLATE LEVEL - Abnormal; Notable for the following components:    Salicylate, Serum <0.3 (*)     All other components within normal limits    Narrative:     Performed at:  [facility]  [address]  [place], [place] [address]   Phone [phone]   AMYLASE    Narrative:     Performed at:  [facility]  [address],  [place], [place] [address]   Phone [phone]   LIPASE    Narrative:     Performed at:  [facility]  [address],  [place], [place] [address]   Phone [phone]   URINE RT REFLEX TO CULTURE    Narrative:     Performed at:  [facility]  [address],  [place], [place] [address]   Phone [phone]   URINE DRUG SCREEN    Narrative:     Performed at:  [facility]  [address],  [place], [place] [address]   Phone [phone]   BRAIN NATRIURETIC PEPTIDE    Narrative:     Performed at:  [facility]  [address]  [place], [place] [address]   Phone [phone]   ETHANOL    Narrative:     Performed at:  [facility]  [address]  [place], [place] [address]   Phone [phone]   HCG, SERUM, QUALITATIVE    Narrative:     Performed at:  [facility]  [address],  [place], [place] [address]   Phone [phone]   TROPONIN    Narrative:     Performed at:  [facility]  [address],  [place], [place] [address]   Phone [phone]       All other labs were within normal range or not returned as of this dictation.    EKG: All EKG's are interpreted by the Emergency Department Physician who either signs orCo-signs this chart in the absence of a cardiologist.  Please see their note for interpretation of EKG.      RADIOLOGY:   Non-plain film images such as CT, Ultrasound and MRI are read by the radiologist. Plain radiographic images are visualized andpreliminarily interpreted by the  ED Provider with the below findings:        Interpretation perthe Radiologist below, if available at the time of this note:    CT ABDOMEN PELVIS W IV CONTRAST Additional Contrast? None   Final Result   No acute intra-abdominal abnormality.  No significant change         XR CHEST STANDARD (2 VW)   Final Result    Stable negative chest.           @RISRESULT @      PROCEDURES   Unless otherwise noted below, none     Procedures    CRITICAL CARE TIME   N/A    CONSULTS:  None      EMERGENCY DEPARTMENT COURSE and DIFFERENTIALDIAGNOSIS/MDM:   Vitals:    Vitals:    09/30/17 1503 09/30/17 1530 09/30/17 1700 09/30/17 1752   BP: 114/84 106/76 103/67 117/85   Pulse: 71 76  64   Resp: 18 23  18    Temp: 98.8 ??F (37.1 ??C)      TempSrc: Infrared      SpO2: 98% 99% 98% 96%   Weight: 136 lb 1 oz (61.7 kg)      Height: 5' (1.524 m)          Patient was given thefollowing medications:  Medications   aluminum & magnesium hydroxide-simethicone (MAALOX) 30 mL, lidocaine viscous (XYLOCAINE) 5 mL (GI COCKTAIL) ( Oral Given 09/30/17 1552)   famotidine (PEPCID) injection 20 mg (20 mg Intravenous Given 09/30/17 1552)   0.9 % sodium chloride bolus (0 mLs Intravenous Stopped 09/30/17 1741)   ondansetron (ZOFRAN) injection 4 mg (4 mg Intravenous Given 09/30/17 1552)   morphine injection 4 mg (4 mg Intravenous Given 09/30/17 1656)   iopamidol (ISOVUE-370) 76 % injection 75 mL (75 mLs Intravenous Given 09/30/17 1633)   potassium chloride (KLOR-CON M) extended release tablet 20 mEq (20 mEq Oral Given 09/30/17 1750)       Patient is a 42 year old female who presents ED with complaint of chronic abdominal pain.  Chronic abdominal pain for multiple aunts.  Patient states worsening symptoms and states associated suicidal ideation and depression.  Patient was discussed with at length about history and physical examination.  Patient complains of depression and thoughts of going to harm herself because she does not feel like she can make it any better.  Workup was obtained here in the ED.  Patient given morphine, GI cocktail, Pepcid, fluids, Zofran here in the emergency department.  Acetaminophen, salicylate and ethanol unremarkable.  Pregnancy negative.  BNP 20.  Troponin normal.  EKG interpreted by attending.  CMP showed potassium 3.4 and was replaced orally.  CBC  unremarkable.  Amylase and lipase normal.  Urinalysis showed no acute abnormality.  Urine drug screen negative.  CT of the abdomen and pelvis showed no acute abnormality.  Chest x-ray unremarkable.  Given history and physical examination suffering from chronic abdominal pain at this time.  Patient also suicidal ideation.  Given patient's abdominal pain appears to be chronic in nature and can be followed up outpatient later that patient does have suicidal ideation at this time and believe would benefit from admission to psychiatric facility for further evaluation and treatment.  72 hour hold was placed.  Patient currently medically cleared at this time and pending evaluation by psychiatric facility.      FINAL IMPRESSION      1. Suicidal ideation    2. Chronic abdominal pain    3. Thickened endometrium          DISPOSITION/PLAN   DISPOSITION Decision To Transfer 09/30/2017 05:47:22 PM      PATIENT REFERREDTO:  No follow-up provider specified.    DISCHARGE MEDICATIONS:  New Prescriptions    No medications on file       DISCONTINUED MEDICATIONS:  Discontinued Medications    No medications on file              (Please note that portions ofthis note were completed with a voice recognition program.  Efforts were made to edit the dictations but occasionally words are mis-transcribed.)    Verdon Cummins, PA (electronically signed)  Isabella Stalling, [place]  09/30/17 2025

## 2017-09-30 NOTE — ED Notes (Signed)
Pt remains in bed, family at bedside, denies needs. ED tech April remains in place as safety companion/ constant observer.      Zenaida Niece, RN  09/30/17 2111

## 2017-09-30 NOTE — ED Notes (Signed)
Pt provided with menu and drink per ben, PA. Denies other needs.      Zenaida Niece, RN  09/30/17 [phone]

## 2017-09-30 NOTE — ED Notes (Signed)
Security, Enrique Sack, at bedside to take possession of pt belongings.      Zenaida Niece, RN  09/30/17 [phone]

## 2017-09-30 NOTE — ED Provider Notes (Signed)
I personally evaluated and examined the patient in conjunction with the APC and agree with the assessment, treatment plan and disposition of the patient has recorded by the APC.     I reviewed pertinent nurse's notes, triage notes, vital signs, past medical history, family and social history, medications, and allergies.     Complete review of systems was conducted by the mid-level provider and/or myself. Review of systems is negative except as documented in the history of present illness.     Brief HPI: 42 year old female presents to the emergency Department chief complaint abdominal pain.  It's been there for 6 months lower abdomen bilaterally.  Achy, moderate without radiation.  No alleviating or aggravating factors.  Due to the chronic abdominal pain she reports suicidal ideation without plan.  She reports suicidal ideation on multiple occasions.  No hallucinations or homicidal ideations.  Note that her daughter helps to interpret.      Physical Exam: General: Patient is in no acute distress   Head: Normocephalic, atraumatic, pupils are equal and reactive to light. EOMI.   Neck: Neck is supple. No JVD noted.   Heart: RRR no murmurs, rubs, or gallops   Lungs: CTA BL   Abdomen: soft, minimally tender to palpation lower abdomen bilaterally.  non-distended   Extremities: no lower extremity edema. Capillary refill is less than 2 seconds   Skin: no cyanosis or pallor; no rashes noted   Neuro: CN's 2-12 are grossly intact. No focal neurologic deficit appreciated.   Psych: Admits to wanting to die but no plans.  Intermittently patient is smiling.    Abdominal labs ordered.  CT Abdomen and pelvis was ordered.  Pink slip ordered.  Suicide precautions ordered.  EMTALA form filled out. Placed on chart.     EKG: EKG interpretation by ED physician: Normal axis, normal sinus rhythm at a rate of 67 bpm. No ischemic ST changes.       CT findings noted in the body notes a ovarian follicle and thickened endometrium.: recommend  OBGYN FU - pt and her daughter notified:      I reexamine the patient after morphine.  Workup here is unremarkable other than CT findings noted above.  Patient continues to say that she is still suicidal she continues to have abdominal pain.  Therefore cannot clear patient for discharge home.  She is medically cleared and I do recommend transfer to psych.        FINAL IMPRESSION     1. Suicidal ideation    2. Chronic abdominal pain    3. Thickened endometrium            Electronically signed by:   Edwinna Areola D.O.               Marquita Palms, DO  09/30/17 1749

## 2017-09-30 NOTE — ED Notes (Signed)
Pt accepted to [facility]. Accepting physician Dr. Leonides Sake Ezme Duch  09/30/17 2134

## 2017-09-30 NOTE — ED Notes (Signed)
Sitter remains at bedside, room remains safe. Pt denies needs at this time.      Deborah Riley M Ozzie Knobel, RN  09/30/17 2016

## 2017-09-30 NOTE — ED Notes (Signed)
Ed tech April remains at bedside as Doctor, hospital. Pt in bed, dietary tray at bedside, family at bedside, denies needs. No distress noted.      Zenaida Niece, RN  09/30/17 [phone]

## 2017-09-30 NOTE — ED Notes (Signed)
Ordered Meal tray      [employer]  09/30/17 1837

## 2017-09-30 NOTE — ED Notes (Signed)
Dr. Carlos American at bedside to assess. Sitter remains at bedside.      Zenaida Niece, RN  09/30/17 [phone]

## 2017-09-30 NOTE — ED Notes (Signed)
Ben, [place] at bedside to assess.      Zenaida Niece, RN  09/30/17 [phone]

## 2017-09-30 NOTE — ED Notes (Signed)
Notified of suicide precautions by triage RN. This RN in room, pt's clothing, jewelry, and belongings removed, witnessed by pt's daughter. Room made safe, cords removed.      Zenaida Niece, RN  09/30/17 [phone]

## 2017-09-30 NOTE — ED Notes (Signed)
PCA Kim at bedside as safety companion/ constant observer. Pt in bed, eyes closed and quiet, chest rise noted. No distress noted.      Zenaida Niece, RN  09/30/17 [phone]

## 2017-09-30 NOTE — ED Notes (Signed)
Pt.s family updated on plan of care. Pt's family would like pt to have cell phone and charger, notified Security to collect new belongings. Pt denies needs. ED tech April remains at bedside as safety companion/ constant observer. No distress noted.      Zenaida Niece, RN  09/30/17 2228

## 2017-10-01 ENCOUNTER — Observation Stay
Admit: 2017-10-01 | Discharge: 2017-10-01 | Disposition: A | Discharge status: Home / Self Care | Payer: MEDICAID | Attending: Psychiatry | Admitting: Psychiatry

## 2017-10-01 DIAGNOSIS — F329 Major depressive disorder, single episode, unspecified: Secondary | ICD-10-CM

## 2017-10-01 MED ORDER — ACETAMINOPHEN 325 MG PO TABS
325 MG | ORAL | Status: DC | PRN
Start: 2017-10-01 — End: 2017-10-01
  Administered 2017-10-01: 08:00:00 650 mg via ORAL

## 2017-10-01 MED ORDER — STERILE WATER FOR INJECTION IJ SOLN
Freq: Once | INTRAMUSCULAR | Status: DC | PRN
Start: 2017-10-01 — End: 2017-10-01

## 2017-10-01 MED ORDER — ALUM & MAG HYDROXIDE-SIMETH 200-200-20 MG/5ML PO SUSP
200-200-20 MG/5ML | Freq: Four times a day (QID) | ORAL | Status: DC | PRN
Start: 2017-10-01 — End: 2017-10-01
  Administered 2017-10-01: 08:00:00 30 mL via ORAL

## 2017-10-01 MED ORDER — ZIPRASIDONE MESYLATE 20 MG IM SOLR
20 MG | Freq: Once | INTRAMUSCULAR | Status: DC | PRN
Start: 2017-10-01 — End: 2017-10-01

## 2017-10-01 MED ORDER — TRAZODONE HCL 50 MG PO TABS
50 MG | Freq: Every evening | ORAL | Status: DC | PRN
Start: 2017-10-01 — End: 2017-10-01

## 2017-10-01 MED ORDER — HYDROXYZINE PAMOATE 50 MG PO CAPS
50 MG | Freq: Three times a day (TID) | ORAL | Status: DC | PRN
Start: 2017-10-01 — End: 2017-10-01

## 2017-10-01 MED ORDER — OLANZAPINE 5 MG PO TABS
5 MG | Freq: Once | ORAL | Status: DC | PRN
Start: 2017-10-01 — End: 2017-10-01

## 2017-10-01 MED ORDER — MAGNESIUM HYDROXIDE 400 MG/5ML PO SUSP
400 MG/5ML | Freq: Every day | ORAL | Status: DC | PRN
Start: 2017-10-01 — End: 2017-10-01

## 2017-10-01 MED ORDER — BENZTROPINE MESYLATE 1 MG/ML IJ SOLN
1 MG/ML | Freq: Two times a day (BID) | INTRAMUSCULAR | Status: DC | PRN
Start: 2017-10-01 — End: 2017-10-01

## 2017-10-01 MED FILL — MAG-AL PLUS 200-200-20 MG/5ML PO LIQD: 200-200-20 MG/5ML | ORAL | Qty: 30

## 2017-10-01 MED FILL — TYLENOL 325 MG PO TABS: 325 mg | ORAL | Qty: 2

## 2017-10-01 NOTE — Progress Notes (Addendum)
Patient utilized a Visual merchandiser, via the [employer]. The interpreter was Dinesh, identification # [id number]. Writer was unable to complete patient's admission. Patient kept asking for her cell phone & when she was going home, instead of answering the questions being asked. Writer was able to complete about 1/2 of the admission. Patient reported that she wasn't mentally ill & was in the wrong hospital Patient reported that she didn't want to kill herself. Patient stated that she said she wanted to die because of having abdominal pain, bad breath & vaginal disscharge for the past 7 months."I can't work or do anything." Patient also stated never going to school in [place] & cannot read or write."I had a few months of classes here to learn English." Patient then stated,"I want to get some apple juice & rest. I'll answer more questions later." Patient was given apple juice & shown to her room at 04:15. Patient appeared asleep at 04:45. Meoshia Billing,R.N.

## 2017-10-01 NOTE — Other (Signed)
Pt [facility] pre-cert completed, faxed to [facility] and sent to [facility].

## 2017-10-01 NOTE — Progress Notes (Addendum)
Writer called [employer], at 21:50, for a Nepali interpreter for patient for today from 9am-9pm. 9am-5pm, job request # [id number] & job request # for 5pm-9pm is [id number].They have called once & stated they are still working on finding an interpreter.Demyah Smyre,R.N.

## 2017-10-01 NOTE — H&P (Signed)
[facility]                 [place] [address]                             OBSERVATION ADMIT AND DC SAME DAY SUMMARY    PATIENT NAME: Deborah Riley, Deborah Riley                       DOB:        February 20, 1976  MED REC NO:   [id number]                          ROOM:       2318  ACCOUNT NO:   [id number]                           ADMIT DATE: 10/01/2017  PROVIDER:     Jeneen Rinks A. Verdene Rio, MD    DISPOSITION:  The patient will be discharged home with outpatient  services.  Also, follow up through PCP.    DISCHARGE MEDICATIONS:  None.    MENTAL STATUS EXAMINATION:  The patient is alert and oriented to person,  place, and time.  No threats to harm self or others.  No psychotic  symptoms.  Insight and judgment improved.  Mood was good.  Affect was  relatively bright.  Speech was soft tone, normal rate.  Thoughts were  coherent and logical.  No auditory or visual hallucinations and no  suicidal or homicidal ideation.  Fund of knowledge and language intact.   Attention and concentration was adequate.  Did not show any abnormal  movements.  Normal gait.    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  AXIS II:  Deferred.  AXIS III:  Abdominal pain.  AXIS IV:  Moderate.  AXIS V:  60.    CHIEF COMPLAINT:  Depression.    HISTORY OF PRESENT ILLNESS:  The patient is a 42 year old [place] female.  She presented to [facility] ED on 09/30/2017 with abdominal pain.   She states that it has been going on for six months.  I saw her with an  interpreter today.  Apparently, she was complaining of abdominal pain  for the past eight months.  She kept coming to the hospital and was not  able to work because of her illness.  This created stress in her home  with all of her children because she was not providing to them  financially.  Apparently, she is not clear as to what this abdominal  pain is from and she states that she also notices that her breath has  a  different odor over the past four months.  She has been having dizzy  spells, vomiting and constipation.  Her sleep has been decreased because  of the stress and appetite has been fine.  Apparently, she has made  statements that she wanted to kill herself because of the pain and  apparently the ED physician in [place] felt that she needed to be  hospitalized because of that.  She denies feeling suicidal at this time.  She states that she only said that because she was feeling overwhelmed  and stressed with the pain.  She has no history of suicide attempts.   She also moved seven months ago  from [place] to [place] to get  better insurance coverage and have her children here.  Her husband died  eight years ago and she is now taking care of seven children and is  overwhelmed financially and physically.    PRIOR PSYCHIATRIC HISTORY:  Inpatient, none.  Outpatient, none.    LEGAL ISSUES:  None.    TRAUMA HISTORY:  None.    DRUGS AND ALCOHOL:  None.    CURRENT MEDICATIONS:  Carafate 10 mL four times a day, Prilosec 20 mg  twice a day.    ALLERGIES TO MEDICATIONS:  Unknown.    MEDICAL HISTORY:  Significant for abdominal pain.    REVIEW OF SYSTEMS:  Pertinent positives on the HPI, otherwise negative.    PHYSICAL EXAMINATION:  Per Clyda Greener, PA, 09/30/2017.    VITAL SIGNS:  Temperature 97.9, pulse 60, respirations 16, blood  pressure 121/61.    LABORATORY DATA:  Reviewed.  No alcohol.  Amylase within normal limits.   CBC with diff, comprehensive profile with no significant abnormalities.   Urine drug screen was negative.    FAMILY PSYCHIATRIC HISTORY:  None reported.    HOSPITAL COURSE:  The patient was hospitalized for 24 hours and when I  met with her today, she was very calm and cooperative.  She was very  pleasant.  She engaged easily.  Her interpreter was present throughout  the interview.  Again, she denied any suicidal ideation.  I felt that  she would be better served by going home to be with her  children, as she  was feeling overwhelmed and stressed by not being with them at this  time.  She stated that she did not intent to hurt herself.  She stated  that she was just very frustrated with her pain and not being able to  get resolution.  The patient was discharged home.    I spent 55 minutes on this evaluation with more than 50% of the time  discussing the patient's care and treatment options with the patient.        Ermelinda Das, MD    D: 10/01/2017 17:04:52       T: 10/01/2017 19:44:41     [id number]  Job#: [id number]     Doc#: [id number]    CC:

## 2017-10-01 NOTE — Discharge Instructions (Signed)
Discharge Planning IS Complete.    Discharge Date: 10/01/2017    Your instructions:  Your physician here was Arelia Longest, MD. If you have any questions please call the unit at [phone].    Please note that we have a patient family advisory council that meets the second Wednesday of January and the second Wednesday of July at 5pm in [place] at [facility]. Department leadership would love for you to attend to give feedback on what we are doing well and areas in which we can improve our patient care. Please come if you are able and feel free to reach out to Dan Humphreys of Nursing Services at [phone] with any questions.     The Crisis Number is [phone]. This Hotline may be accessed 24 hours a day, 7 days a week.  You did complete a safety plan with social work staff during your admission. A copy of that safety plan has been provided to you.     Please follow up with your PCP regarding any pending labs.     Your next appointment is:  Date and time of appointment: Monday July 15 at 3:00  This is an appointment you have already established and want to keep  The type/s of services requested are: gastrology and liver  Agency name: [facility]  Address: [address] [place], [place] [address]  Phone Number: [phone]  Special instructions (what to bring to appointment, etc.):   ID  Insurance Card  List of medications    Other appointments:   Name of Provider: [facility] ([facility]  Address: [address], [place], [place] [address]  Phone Number: [phone]  They have walk in times for anything needed Monday through Friday 9am to 5pm  They provide the following services:  1. Navigation Services   2. Cultural / Community Orientation   3. Health-Related Services   4. Home Management Services   5. Transportation   6. Translation and Interpretation Services   7. Case Management Services   8. English Environmental education officer   9. Employability  Services   10. Academic Enrichment/ College Readiness   11. Emotional Wellness Services   12. Referral Services   13. Citizenship Preparation/Civic Engagement      Discharge Completed By: [facility]  Fax to: Provider name and Number  [facility]: Fax : [phone]      The following personal items were collected during your admission and were returned to you:    Valuables  Dentures: None  Vision - Corrective Lenses: None  Hearing Aid: None  Jewelry: Ring, Earrings, Bracelet  Body Piercings Removed: N/A  Clothing: Footwear, Jacket / coat, Pants, Shirt, Undergarments (Comment)  Other Valuables: Cell phone(charger)    By signing below, I understand and acknowledge receipt of the instructions indicated above.

## 2017-10-01 NOTE — Progress Notes (Addendum)
Patient arrived on the unit at 01:40, via stretcher from [facility] emergency room, accompanied by 2 transport staff. Patient was pleasent & cooperative greeted by staff. Patient speaks limited english. Patient reported that she needs a [place] interpreter. Writer will utilize a Visual merchandiser, via cyracom phone. Duane Trias,R.N.

## 2017-10-01 NOTE — Plan of Care (Signed)
Patient has been resting most of the morning part of the shift. She did try to call family members, but they did not answer the phone. The interpretor arrived this afternoon and staff was able to interact with patient. She indicated that she went to the emergency room last evening for stomach pain that she has been dealing with for the past 8 months and not getting relief. The medications they give her do not work. She has been off work for this time frame and her son is the only person providing for the 7 people that live in the home. She does voice vague thoughts about wanting to dying because of the pain, but denies any thoughts of wanting to kill herself. She denies that she has ever made an attempt to harm herself either. She is very future oriented about being the guardian of her 3 children that are still under the age of 43 yo (43 yo, 43 yo, and 43 yo). She feels a lot of pressure because she is not working to try to provide for them. The 43 yo son's family lives with them too and he works. They are in a 3 bedroom house. She denies all mental health symptoms at this time. She would like to be discharged soon to get back to her children.

## 2017-10-01 NOTE — ED Notes (Signed)
Report given to transport team. Pt sent with belongings and paperwork.      Judith Part, RN  10/01/17 0100

## 2017-10-01 NOTE — Progress Notes (Signed)
[facility]  Discharge Note    Pt discharged with followings belongings:   Dentures: None  Vision - Corrective Lenses: None  Hearing Aid: None  Jewelry: Ring, Earrings, Bracelet  Body Piercings Removed: N/A  Clothing: Footwear, Jacket / coat, Pants, Shirt, Undergarments (Comment)  Other Valuables: Cell phone(charger)   Valuables sent home with yes. Patient left department with Departure Mode: Other (Comment)(with pastor) via Mobility at Departure: Ambulatory, discharged to Discharged to: Private Residence. Patient education on aftercare instructions: yes  Patient verbalize understanding of AVS:  yes.    Status EXAM upon discharge:  Status and Exam  Normal: Yes  Facial Expression: Brightened  Affect: Appropriate, Congruent  Level of Consciousness: Alert  Mood:Normal: Yes  Motor Activity:Normal: Yes  Interview Behavior: Cooperative  Preception: Orient to Person, Orient to Time, Orient to Place, Orient to Situation  Attention:Normal: Yes  Thought Processes: Perseveration  Thought Content:Normal: Yes  Hallucinations: None  Delusions: No  Memory:Normal: Yes  Insight and Judgment: Yes  Insight and Judgment: Poor Judgment, Poor Insight  Present Suicidal Ideation: No  Present Homicidal Ideation: No    Keenan Bachelor, RN

## 2017-10-01 NOTE — Plan of Care (Signed)
[facility]  Initial Interdisciplinary Treatment Plan NOTE    Review Date & Time: 10/01/17 0946    Patient was not in treatment team    Admission Type:   Admission Type: Involuntary    Reason for admission:  Reason for Admission: "I don't know."      Estimated Length of Stay Update:  3 days  Estimated Discharge Date Update: 10/04/17-10/06/17    PATIENT STRENGTHS:  Patient Strengths Strengths: Other(Family)  Patient Strengths and Limitations:Limitations: Limited education -> difficulty reading or writing, Demonstrates discomfort with /lack of social skills  Addictive Behavior:Addictive Behavior  In the past 3 months, have you felt or has someone told you that you have a problem with:  : None  Do you have a history of Chemical Use?: No  Do you have a history of Alcohol Use?: No  Do you have a history of Street Drug Abuse?: No  Histroy of Prescripton Drug Abuse?: No  Medical Problems:  Past Medical History:   Diagnosis Date   ??? H. pylori infection        EDUCATION:   Advice worker Progress Toward Treatment Goals: Reviewed goals and plan of care    Method: Individual    Outcome: Verbalized understanding    PATIENT GOALS: none    PLAN/TREATMENT RECOMMENDATIONS UPDATE:evaluate for treatment    GOALS UPDATE:   Time frame for Short-Term Goals: 2 days    Halina Andreas, RN

## 2017-10-01 NOTE — ED Notes (Signed)
Sitter remains at bedside, room remains safe. Pt denies needs at this time.      Judith Part, RN  10/01/17 [phone]

## 2017-10-01 NOTE — Progress Notes (Signed)
Bridge Appointment completed: Reviewed Discharge Instructions with patient.    Patient verbalizes understanding and agreement with the discharge plan using the teachback method.     Referral for Outpatient Tobacco Cessation Counseling, upon discharge (mark X if applicable and completed):    ( )  Hospital staff assisted patient to call Quit Line or faxed referral                                   during hospitalization                  ( )  Recognizing danger situations (included triggers and roadblocks), if not completed on admission                    ( )  Coping skills (new ways to manage stress, exercise, relaxation techniques, changing routine, distraction), if not completed on admission                                                           ( )  Basic information about quitting (benefits of quitting, techniques in how to quit, available resources, if not completed on admission  ( ) [facility]   ( ) Patient refused referral  ( ) Patient refused counseling  ( ) Patient refused smoking cessation medication upon discharge  (X) N/A  Vaccinations (mark X if applicable and completed):  ( ) Patient states already received influenza vaccine elsewhere  ( ) Patient received influenza vaccine during this hospitalization  ( ) Patient refused influenza vaccine at this time

## 2017-10-11 IMAGING — CR DG CHEST 2V
2 series · 2 of 2 positions shown · non-contrast
Comparison: Chest radiograph 02/03/2016

CLINICAL DATA: Chest pain

EXAM:
CHEST  2 VIEW

[chest pa]
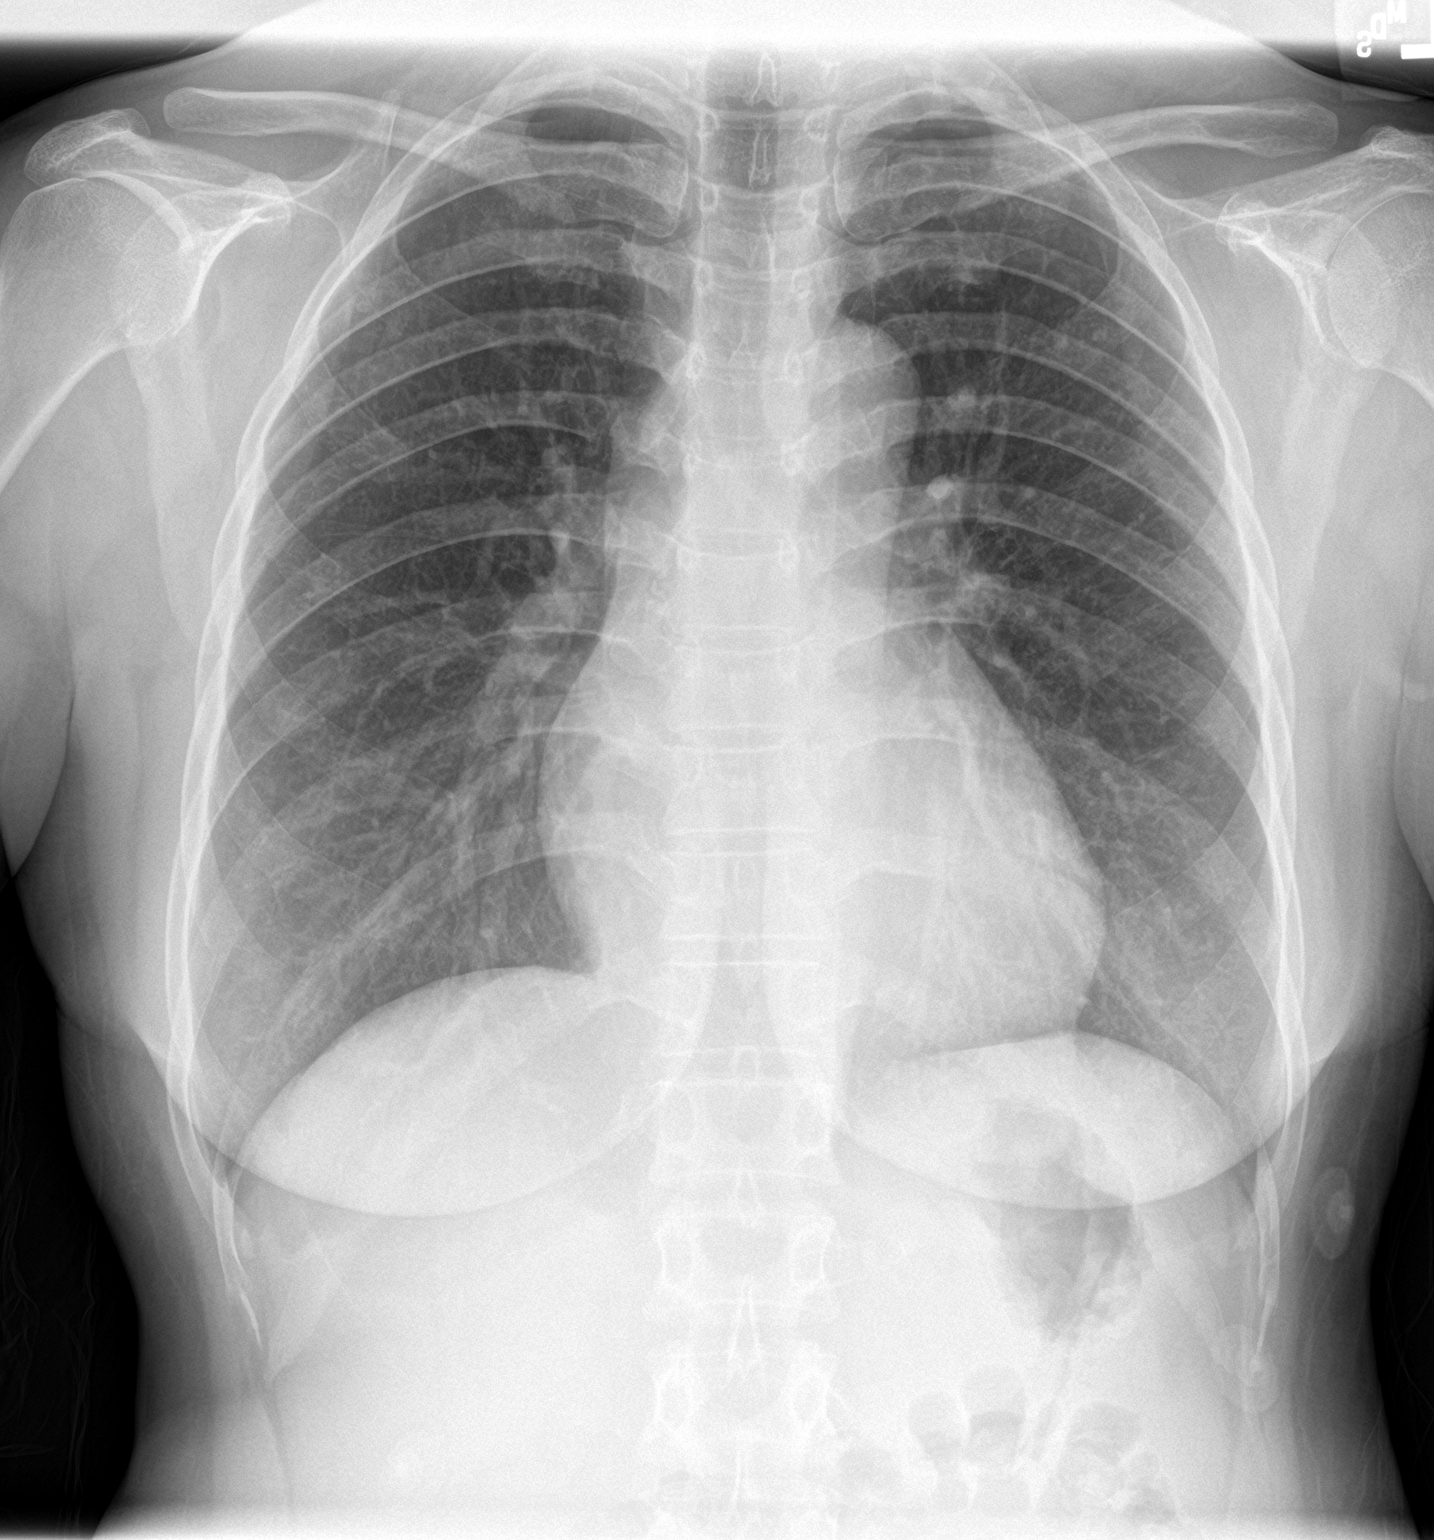

[chest lat]
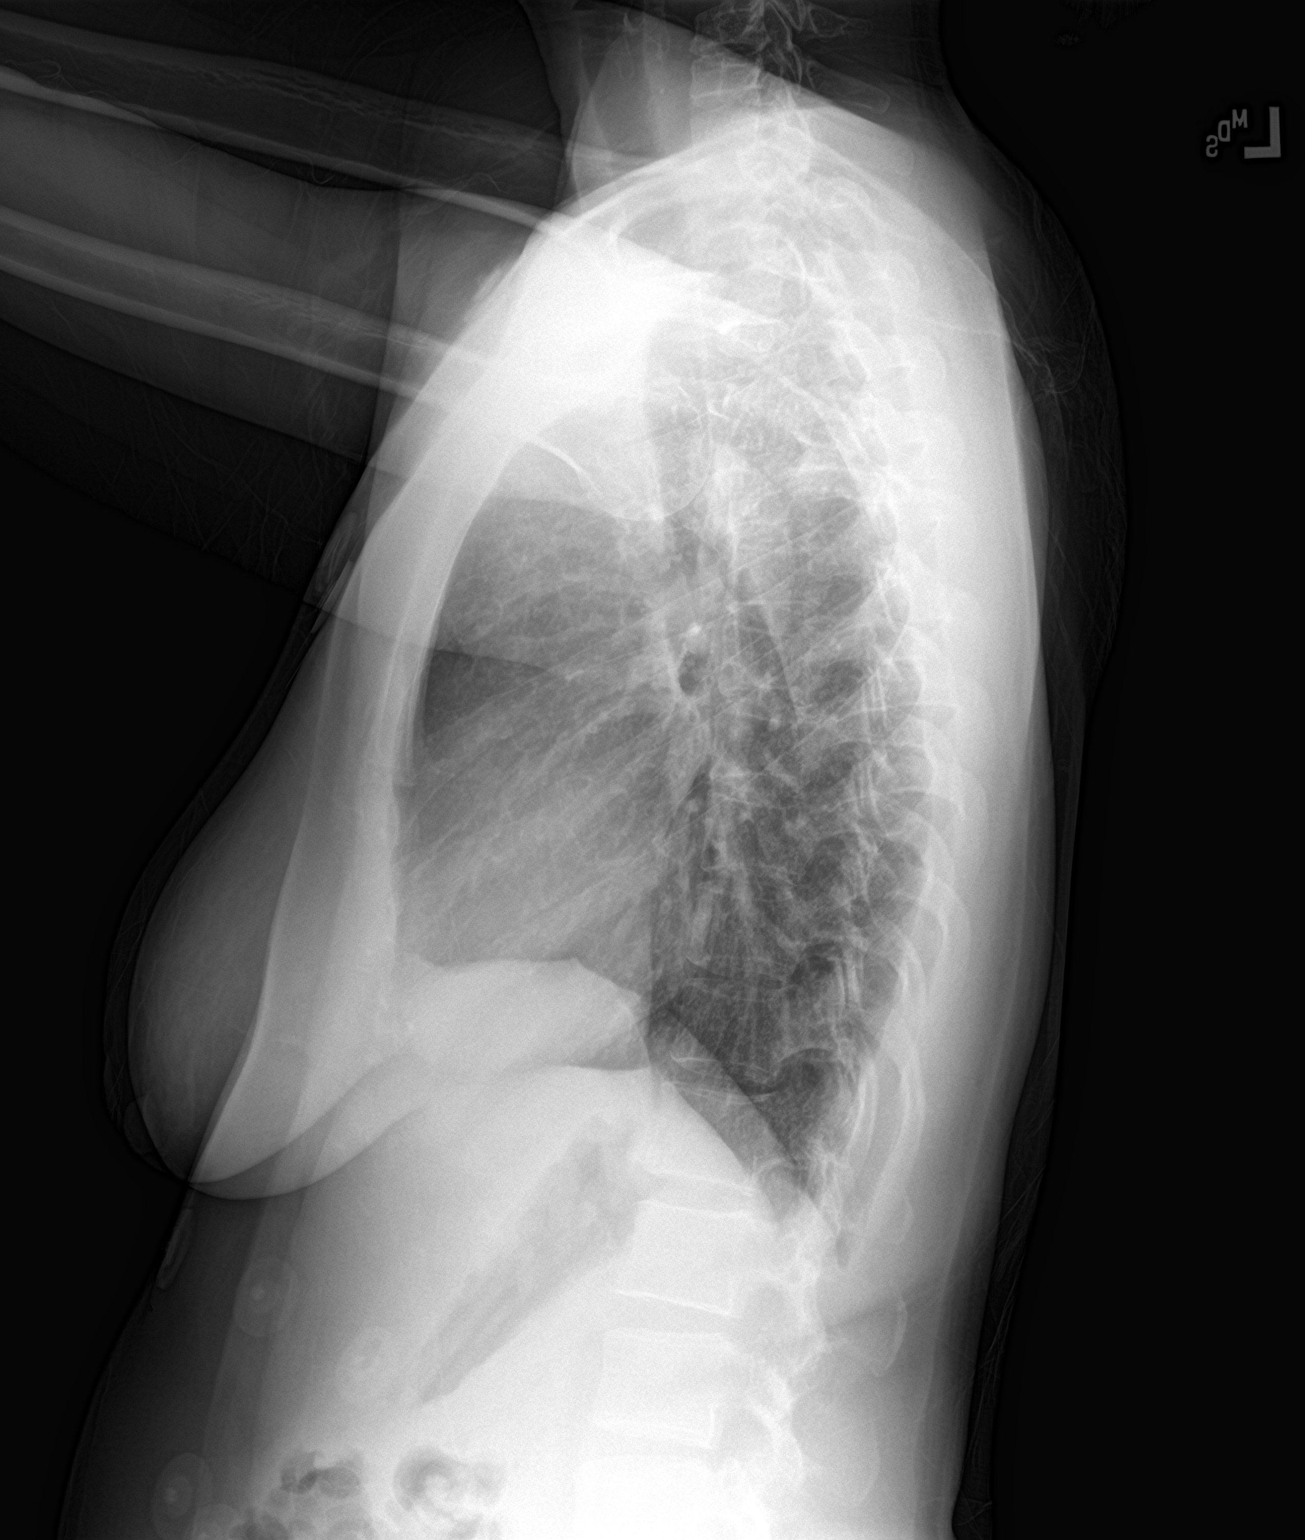

[2 of 2 positions shown; findings below may reference images not displayed]

FINDINGS: The heart size and mediastinal contours are within normal limits.
Both lungs are clear. The visualized skeletal structures are
unremarkable.
IMPRESSION: No active cardiopulmonary disease.

## 2017-11-10 ENCOUNTER — Inpatient Hospital Stay: Admit: 2017-11-10 | Discharge: 2017-11-10 | Disposition: A | Discharge status: Home / Self Care | Payer: MEDICAID

## 2017-11-10 DIAGNOSIS — R1013 Epigastric pain: Secondary | ICD-10-CM

## 2017-11-10 LAB — CBC WITH AUTO DIFFERENTIAL
Basophils %: 0.9 %
Basophils Absolute: 0.1 10*3/uL (ref 0.0–0.2)
Eosinophils %: 1.3 %
Eosinophils Absolute: 0.1 10*3/uL (ref 0.0–0.6)
Hematocrit: 38.8 % (ref 36.0–48.0)
Hemoglobin: 12.9 g/dL (ref 12.0–16.0)
Lymphocytes %: 25.5 %
Lymphocytes Absolute: 1.8 10*3/uL (ref 1.0–5.1)
MCH: 30.3 pg (ref 26.0–34.0)
MCHC: 33.4 g/dL (ref 31.0–36.0)
MCV: 90.8 fL (ref 80.0–100.0)
MPV: 10.5 fL (ref 5.0–10.5)
Monocytes %: 8.8 %
Monocytes Absolute: 0.6 10*3/uL (ref 0.0–1.3)
Neutrophils %: 63.5 %
Neutrophils Absolute: 4.5 10*3/uL (ref 1.7–7.7)
Platelets: 161 10*3/uL (ref 135–450)
RBC: 4.27 M/uL (ref 4.00–5.20)
RDW: 13.1 % (ref 12.4–15.4)
WBC: 7.2 10*3/uL (ref 4.0–11.0)

## 2017-11-10 LAB — EKG 12-LEAD
Atrial Rate: 69 {beats}/min
P Axis: 48 degrees
P-R Interval: 146 ms
Q-T Interval: 382 ms
QRS Duration: 86 ms
QTc Calculation (Bazett): 409 ms
R Axis: 6 degrees
T Axis: 1 degrees
Ventricular Rate: 69 {beats}/min

## 2017-11-10 LAB — URINALYSIS WITH REFLEX TO CULTURE
Bilirubin Urine: NEGATIVE
Blood, Urine: NEGATIVE
Glucose, Ur: NEGATIVE mg/dL
Ketones, Urine: NEGATIVE mg/dL
Leukocyte Esterase, Urine: NEGATIVE
Nitrite, Urine: NEGATIVE
Protein, UA: NEGATIVE mg/dL
Specific Gravity, UA: 1.014 (ref 1.005–1.030)
Urobilinogen, Urine: 0.2 E.U./dL (ref <2.0)
pH, UA: 6.5 (ref 5.0–8.0)

## 2017-11-10 LAB — WET PREP, GENITAL
Clue Cells, Wet Prep: NONE SEEN
RBC, Wet Prep: NONE SEEN
Trichomonas Prep: NONE SEEN
Yeast, Wet Prep: NONE SEEN

## 2017-11-10 LAB — TROPONIN: Troponin: <0.01 ng/mL (ref <0.01)

## 2017-11-10 LAB — COMPREHENSIVE METABOLIC PANEL
ALT: 11 U/L (ref 10–40)
AST: 16 U/L (ref 15–37)
Albumin/Globulin Ratio: 1.1 (ref 1.1–2.2)
Albumin: 4.1 g/dL (ref 3.4–5.0)
Alkaline Phosphatase: 55 U/L (ref 40–129)
Anion Gap: 11 (ref 3–16)
BUN: 14 mg/dL (ref 7–20)
CO2: 25 mmol/L (ref 21–32)
Calcium: 9.4 mg/dL (ref 8.3–10.6)
Chloride: 103 mmol/L (ref 99–110)
Creatinine: <0.5 mg/dL — ABNORMAL LOW (ref 0.6–1.1)
GFR African American: >60 (ref >60)
GFR Non-African American: >60 (ref >60)
Globulin: 3.8 g/dL
Glucose: 114 mg/dL — ABNORMAL HIGH (ref 70–99)
Potassium: 3.7 mmol/L (ref 3.5–5.1)
Sodium: 139 mmol/L (ref 136–145)
Total Bilirubin: 1.1 mg/dL — ABNORMAL HIGH (ref 0.0–1.0)
Total Protein: 7.9 g/dL (ref 6.4–8.2)

## 2017-11-10 LAB — LIPASE: Lipase: 26 U/L (ref 13.0–60.0)

## 2017-11-10 LAB — HCG, SERUM, QUALITATIVE: hCG Qual: NEGATIVE

## 2017-11-10 MED ORDER — FAMOTIDINE 20 MG/2ML IV SOLN
20 MG/2ML | Freq: Once | INTRAVENOUS | Status: AC
Start: 2017-11-10 — End: 2017-11-10
  Administered 2017-11-10: 19:00:00 20 mg via INTRAVENOUS

## 2017-11-10 MED ORDER — AMOXICILLIN 500 MG PO CAPS
500 MG | ORAL_CAPSULE | Freq: Two times a day (BID) | ORAL | 0 refills | Status: AC
Start: 2017-11-10 — End: 2017-11-17

## 2017-11-10 MED ORDER — LIDOCAINE VISCOUS HCL 2 % MT SOLN
2 % | Freq: Once | OROMUCOSAL | Status: AC
Start: 2017-11-10 — End: 2017-11-10
  Administered 2017-11-10: 19:00:00 via ORAL

## 2017-11-10 MED ORDER — OMEPRAZOLE 20 MG PO CPDR
20 MG | ORAL_CAPSULE | Freq: Two times a day (BID) | ORAL | 0 refills | Status: DC
Start: 2017-11-10 — End: 2018-02-07

## 2017-11-10 MED FILL — MAG-AL PLUS 200-200-20 MG/5ML PO LIQD: 200-200-20 MG/5ML | ORAL | Qty: 30

## 2017-11-10 MED FILL — FAMOTIDINE 20 MG/2ML IV SOLN: 20 MG/2ML | INTRAVENOUS | Qty: 2

## 2017-11-10 NOTE — Other (Signed)
Culture Result reviewed, no further treatment needed.

## 2017-11-10 NOTE — ED Notes (Signed)
[place] interpreter [phone] contacted for triage.     Weyman Pedro, RN  11/10/17 1345

## 2017-11-10 NOTE — ED Notes (Signed)
[place] interpreter [phone] contacted for d/c instructions. Patient verbalized understanding of discharge instructions and follow-up care.  Patient was given 2 of prescriptions and verbalized understanding of instructions for said prescriptions.  Breathing regular, no distress, skin Skin color, texture, turgor normal. No rashes or lesions., patient alert and oriented X3.  Patient ambulated out of emergency department with steady gait.       Weyman Pedro, RN  11/10/17 [phone]

## 2017-11-10 NOTE — ED Triage Notes (Signed)
Pt to ED with c/o abdominal pain, vaginal pain, discharge and odor that goes up to her throat. Pt c/o burning sensation with urination, dizziness as if the car/room spinning. Interpreter [phone] for complete triage, medication and vaginal swabs.

## 2017-11-10 NOTE — ED Provider Notes (Signed)
[facility] EMERGENCY DEPARTMENT  EMERGENCY DEPARTMENT ENCOUNTER      Pt Name: Deborah Riley  MRN: [id number]  Birthdate February 28, 1976  Date of evaluation: 11/10/2017  Provider: Irven , PA    This patient was not seen and evaluated by the attending physician No att. providers found.    CHIEF COMPLAINT       Chief Complaint   Patient presents with   ??? Abdominal Pain     from vagina up to throat per the patient, burning with urination   ??? Dizziness     1 week, denies falls, states it feel like room/car spinning   ??? Vaginal Pain     + discharge with odor, denies concern for STD     HISTORY OF PRESENT ILLNESS  (Location/Symptom, Timing/Onset, Context/Setting, Quality, Duration, Modifying Factors, Severity.)   Deborah Riley is a 42 y.o. female who presents to the emergency department complaining of a lightheaded feeling, vaginal white discharge with odor.  Denies new sexual partners or concern for STD.  Patient states that she has some lower abdominal pain that radiates up into her chest that has been going on for months.  She states it started to get better after she was treated for H. pylori last month and had an ulcer.  She denies any dark tarry or bloody stools.  She denies alcohol or NSAID use.  Patient does not primarily speak English so I spoke to her translator [phone] as well as translator [phone].  No numbness no weakness.  Normal bowel movement.  No vomiting.  Some nausea.  She denies chest pain or shortness of breath.    Nursing Notes were reviewed and I agree.    REVIEW OF SYSTEMS    (2-9 systems for level 4, 10 or more for level 5)     Review of Systems   Constitutional: Negative for fever.   Respiratory: Negative for shortness of breath.    Cardiovascular: Negative for chest pain.   Gastrointestinal: Positive for nausea. Negative for blood in stool, constipation, diarrhea and vomiting.   Genitourinary: Positive for vaginal discharge.   Musculoskeletal: Negative for back pain.   Skin:  Negative for color change, rash and wound.   Neurological: Positive for light-headedness. Negative for weakness and numbness.   Psychiatric/Behavioral: Negative for agitation, behavioral problems and confusion.       Except as noted above the remainder of the review of systems was reviewed and negative.       PAST MEDICAL HISTORY         Diagnosis Date   ??? H. pylori infection        SURGICAL HISTORY     History reviewed. No pertinent surgical history.    CURRENT MEDICATIONS       Previous Medications    ONDANSETRON (ZOFRAN ODT) 8 MG TBDP DISINTEGRATING TABLET    Take 1 tablet by mouth every 8 hours as needed for Nausea    SUCRALFATE (CARAFATE) 1 GM/10ML SUSPENSION    Take 10 mLs by mouth 4 times daily    VITAMIN D (ERGOCALCIFEROL) 50000 UNITS CAPS CAPSULE    Take 1 capsule by mouth once a week       ALLERGIES     Patient has no known allergies.    FAMILY HISTORY           Problem Relation Age of Onset   ??? Heart Disease Father      Family Status   Relation Name Status   ???  Father  (Not Specified)        SOCIAL HISTORY      reports that she has quit smoking. She has never used smokeless tobacco. She reports that she does not drink alcohol or use drugs.    PHYSICAL EXAM    (up to 7 for level 4, 8 or more for level 5)     ED Triage Vitals   BP Temp Temp Source Pulse Resp SpO2 Height Weight   11/10/17 1329 11/10/17 1329 11/10/17 1329 11/10/17 1329 11/10/17 1329 11/10/17 1329 11/10/17 1347 11/10/17 1329   106/70 98.2 ??F (36.8 ??C) Oral 68 19 98 % 5' (1.524 m) 116 lb 10 oz (52.9 kg)     Physical Exam   Constitutional: She is oriented to person, place, and time. She appears well-developed and well-nourished. No distress.   HENT:   Head: Normocephalic and atraumatic.   Eyes: Pupils are equal, round, and reactive to light.   Neck: Normal range of motion.   Cardiovascular: Normal rate and regular rhythm.   Pulmonary/Chest: Effort normal. No respiratory distress.   Abdominal: Soft. There is no tenderness. There is no guarding.    Musculoskeletal: Normal range of motion.   Neurological: She is alert and oriented to person, place, and time. No cranial nerve deficit.   Skin: Skin is warm.   Psychiatric: She has a normal mood and affect. Her behavior is normal.   Nursing note and vitals reviewed.      DIAGNOSTIC RESULTS     EKG: All EKG's are interpreted by Irven [place], PA in the absence of a cardiologist.    EKG interpreted by myself - please refer to attending physician's note for complete EKG interpretation:    Rhythm: sinus rhythm   Rate: nl  No evidence of acute ischemia or injury.    RADIOLOGY:   Non-plain film images such as CT, Ultrasound and MRI are read by the radiologist. Plain radiographic images are visualized and preliminarily interpreted by Irven [place], PA with the below findings:    Reviewed radiologist dictation.    Interpretation per the Radiologist below, if available at the time of this note:    XR CHEST STANDARD (2 VW)   Final Result   No active cardiopulmonary disease               LABS:  Labs Reviewed   COMPREHENSIVE METABOLIC PANEL - Abnormal; Notable for the following components:       Result Value    Glucose 114 (*)     CREATININE <0.5 (*)     Total Bilirubin 1.1 (*)     All other components within normal limits    Narrative:     Performed at:  [facility]  [address] [place], [place] [address]   Phone [phone]   WET PREP, GENITAL    Narrative:     Performed at:  [facility]  [address] [place], [place] [address]   Phone [phone]   C.TRACHOMATIS N.GONORRHOEAE DNA   CBC WITH AUTO DIFFERENTIAL    Narrative:     Performed at:  [facility]  [address] [place], [place] [address]   Phone [phone]   LIPASE    Narrative:     Performed at:  [facility]  [address] [place], [place] [address]   Phone [phone]  [phone]   URINE RT REFLEX TO CULTURE    Narrative:      Performed at:  [facility]  [address] [place], [place] [address]   Phone [phone]   HCG, SERUM, QUALITATIVE    Narrative:     Performed at:  [facility]  [address] [place], [place] [address]   Phone [phone]   TROPONIN    Narrative:     Performed at:  [facility]  [address] [place], [place] [address]   Phone [phone]       All other labs were within normal range or not returned as of this dictation.    EMERGENCY DEPARTMENT COURSE and DIFFERENTIAL DIAGNOSIS/MDM:   Vitals:    Vitals:    11/10/17 1347 11/10/17 1532 11/10/17 1631 11/10/17 1656   BP:  99/69 90/67 104/65   Pulse:  60 58 68   Resp:  19 18    Temp:       TempSrc:       SpO2:  97% 98%    Weight:       Height: 5' (1.524 m)        I discussed with Zeriyah Dudenhoeffer and/or family the exam results, diagnosis, care, prognosis, reasons to return and the importance of follow up. Patient and/or family is in full agreement with plan and all questions have been answered.  Specific discharge instructions explained, including reasons to return to the emergency department.Eunie Tortorelli is well appearing, non-toxic, and afebrile at the time of discharge.     Patient has generalized abdominal pain radiating from lower abdomen up into her throat.  No chest pain no shortness of breath.  Clear lung sounds.  Was recently treated for H. pylori ulcer and states that the symptoms have persisted including a foul odor in her "breath."  No black tarry or bloody stool.  Hemoglobin is 12.9.  Belly is soft.  Lab work reassuring.  No concern for STDs.  Will retreat per up-to-date with amoxicillin 1 g twice daily for 7 days as well as PPI twice daily.  Follow-up with GI and primary care and return for any new, worsening or other concerns.    I estimate there is LOW risk for ACUTE APPENDICITIS, BOWEL OBSTRUCTION, CHOLECYSTITIS, DIVERTICULITIS, INCARCERATED HERNIA,  PANCREATITIS, PELVIC INFLAMMATORY DISEASE, OVARIAN TORSION, PERFORATED BOWEL,  BOWEL ISCHEMIA, CARDIAC ISCHEMIA, ECTOPIC PREGNANCY, or TUBO-OVARIAN ABSCESS, thus I consider the discharge disposition reasonable. Also, there is no evidence or peritonitis, sepsis, or toxicity.     CONSULTS:  None    PROCEDURES:  None    FINAL IMPRESSION      1. Abdominal pain, epigastric    2. H pylori ulcer          DISPOSITION/PLAN   DISPOSITION Decision To Discharge 11/10/2017 04:57:34 PM      PATIENT REFERRED TO:  Laqueta Due, APRN - CNP  [address]  [place] [address]  [phone]    Call in 1 day  For follow up      DISCHARGE MEDICATIONS:  New Prescriptions    AMOXICILLIN (AMOXIL) 500 MG CAPSULE    Take 2 capsules by mouth 2 times daily for 7 days       (Please note that portions of this note were completed with a voice recognition program.  Efforts were made to edit the dictations but occasionally words are  mis-transcribed.)    Irven , PA       Irven , [place]  11/10/17 1714

## 2017-11-10 NOTE — ED Provider Notes (Signed)
Pt Name: Deborah Riley  MRN: [id number]  Birthdate 05-18-1976  Date of evaluation: 11/10/2017    EKG Interpretation    The purpose of this note is for preliminary EKG interpretation only.  This patient was seen independently by the mid-level provider and was not seen by this provider.    EKG visualized preliminarily interpreted by myself shows sinus rhythm at a rate of 69 with a normal axis of 6.  Inverted P wave in lead V1.  Otherwise ST-T wave intervals are all within normal limits       Synetta Fail, MD  11/10/17 1654

## 2017-11-11 LAB — C.TRACHOMATIS N.GONORRHOEAE DNA
C. trachomatis DNA: NEGATIVE
N. gonorrhoeae DNA: NEGATIVE

## 2017-11-21 ENCOUNTER — Encounter

## 2017-12-08 ENCOUNTER — Inpatient Hospital Stay: Payer: MEDICAID | Primary: Family

## 2017-12-08 DIAGNOSIS — R1013 Epigastric pain: Secondary | ICD-10-CM

## 2017-12-09 LAB — MAGNESIUM: Magnesium: 2 mg/dL (ref 1.80–2.40)

## 2018-02-07 ENCOUNTER — Inpatient Hospital Stay: Admit: 2018-02-07 | Discharge: 2018-02-07 | Disposition: A | Discharge status: Home / Self Care | Payer: MEDICAID

## 2018-02-07 ENCOUNTER — Emergency Department: Admit: 2018-02-07 | Payer: MEDICAID | Primary: Family

## 2018-02-07 DIAGNOSIS — R1013 Epigastric pain: Secondary | ICD-10-CM

## 2018-02-07 LAB — BASIC METABOLIC PANEL W/ REFLEX TO MG FOR LOW K
Anion Gap: 11 (ref 3–16)
BUN: 10 mg/dL (ref 7–20)
CO2: 25 mmol/L (ref 21–32)
Calcium: 8.9 mg/dL (ref 8.3–10.6)
Chloride: 101 mmol/L (ref 99–110)
Creatinine: 0.5 mg/dL — ABNORMAL LOW (ref 0.6–1.1)
GFR African American: >60 (ref >60)
GFR Non-African American: >60 (ref >60)
Glucose: 114 mg/dL — ABNORMAL HIGH (ref 70–99)
Potassium reflex Magnesium: 3.4 mmol/L — ABNORMAL LOW (ref 3.5–5.1)
Sodium: 137 mmol/L (ref 136–145)

## 2018-02-07 LAB — URINALYSIS WITH REFLEX TO CULTURE
Bilirubin Urine: NEGATIVE
Blood, Urine: NEGATIVE
Glucose, Ur: NEGATIVE mg/dL
Ketones, Urine: NEGATIVE mg/dL
Leukocyte Esterase, Urine: NEGATIVE
Nitrite, Urine: NEGATIVE
Protein, UA: NEGATIVE mg/dL
Specific Gravity, UA: 1.013 (ref 1.005–1.030)
Urobilinogen, Urine: 0.2 E.U./dL (ref <2.0)
pH, UA: 8 (ref 5.0–8.0)

## 2018-02-07 LAB — CBC WITH AUTO DIFFERENTIAL
Basophils %: 0.7 %
Basophils Absolute: 0.1 10*3/uL (ref 0.0–0.2)
Eosinophils %: 0.8 %
Eosinophils Absolute: 0.1 10*3/uL (ref 0.0–0.6)
Hematocrit: 38.4 % (ref 36.0–48.0)
Hemoglobin: 12.9 g/dL (ref 12.0–16.0)
Lymphocytes %: 18.6 %
Lymphocytes Absolute: 1.8 10*3/uL (ref 1.0–5.1)
MCH: 30.3 pg (ref 26.0–34.0)
MCHC: 33.7 g/dL (ref 31.0–36.0)
MCV: 89.9 fL (ref 80.0–100.0)
MPV: 11.3 fL — ABNORMAL HIGH (ref 5.0–10.5)
Monocytes %: 7.8 %
Monocytes Absolute: 0.7 10*3/uL (ref 0.0–1.3)
Neutrophils %: 72.1 %
Neutrophils Absolute: 6.9 10*3/uL (ref 1.7–7.7)
Platelets: 165 10*3/uL (ref 135–450)
RBC: 4.27 M/uL (ref 4.00–5.20)
RDW: 12.7 % (ref 12.4–15.4)
WBC: 9.5 10*3/uL (ref 4.0–11.0)

## 2018-02-07 LAB — TROPONIN: Troponin: <0.01 ng/mL (ref <0.01)

## 2018-02-07 LAB — LIPASE: Lipase: 25 U/L (ref 13.0–60.0)

## 2018-02-07 LAB — MAGNESIUM: Magnesium: 1.8 mg/dL (ref 1.80–2.40)

## 2018-02-07 LAB — HCG, SERUM, QUALITATIVE: hCG Qual: NEGATIVE

## 2018-02-07 MED ORDER — OMEPRAZOLE 20 MG PO CPDR
20 MG | ORAL_CAPSULE | Freq: Two times a day (BID) | ORAL | 0 refills | Status: DC
Start: 2018-02-07 — End: 2018-05-02

## 2018-02-07 MED ORDER — FAMOTIDINE 20 MG PO TABS
20 MG | Freq: Once | ORAL | Status: AC
Start: 2018-02-07 — End: 2018-02-07
  Administered 2018-02-07: 19:00:00 20 mg via ORAL

## 2018-02-07 MED ORDER — ONDANSETRON 8 MG PO TBDP
8 MG | ORAL_TABLET | Freq: Three times a day (TID) | ORAL | 1 refills | Status: DC | PRN
Start: 2018-02-07 — End: 2018-08-20

## 2018-02-07 MED ORDER — SUCRALFATE 1 GM/10ML PO SUSP
1 GM/0ML | Freq: Four times a day (QID) | ORAL | 0 refills | Status: DC
Start: 2018-02-07 — End: 2018-08-20

## 2018-02-07 MED ORDER — ALUM & MAG HYDROXIDE-SIMETH 200-200-20 MG/5ML PO SUSP
200-200-20 MG/5ML | Freq: Once | ORAL | Status: AC
Start: 2018-02-07 — End: 2018-02-07
  Administered 2018-02-07: 19:00:00 via ORAL

## 2018-02-07 MED ORDER — ONDANSETRON 4 MG PO TBDP
4 MG | Freq: Once | ORAL | Status: AC
Start: 2018-02-07 — End: 2018-02-07
  Administered 2018-02-07: 19:00:00 4 mg via ORAL

## 2018-02-07 MED ORDER — BENZONATATE 100 MG PO CAPS
100 MG | ORAL_CAPSULE | Freq: Three times a day (TID) | ORAL | 0 refills | Status: AC | PRN
Start: 2018-02-07 — End: 2018-02-14

## 2018-02-07 MED FILL — MAG-AL PLUS 200-200-20 MG/5ML PO LIQD: 200-200-20 MG/5ML | ORAL | Qty: 30

## 2018-02-07 MED FILL — ONDANSETRON 4 MG PO TBDP: 4 mg | ORAL | Qty: 1

## 2018-02-07 MED FILL — FAMOTIDINE 20 MG PO TABS: 20 mg | ORAL | Qty: 1

## 2018-02-07 NOTE — ED Notes (Signed)
interpretor called, #[id number]     Charleston Ropesrystal Ozie Dimaria, RN  02/07/18 [phone]

## 2018-02-07 NOTE — ED Notes (Signed)
Pt up and to BR. Pt presents with steady gait and tolerates well. Urine sample obtained and sent to lab at this time.      Charleston Ropesrystal Tamberly Pomplun, RN  02/07/18 1455

## 2018-02-07 NOTE — ED Provider Notes (Signed)
I did not have face-to-face interaction with this patient. I did not discuss the case with the advanced practice provider. I was available for consultation on the patient if deemed necessary by advanced practice provider.    EKG  The Ekg interpreted by me shows  normal sinus rhythm with a rate of 64  Axis is   Normal  QTc is  normal  Intervals and Durations are unremarkable.      ST Segments: no acute change  No significant change from prior EKG dated 11/10/17            Baldwin CrownMatthew Erie Sica, MD  02/07/18 [phone]

## 2018-02-07 NOTE — ED Provider Notes (Signed)
[facility]  EMERGENCY DEPARTMENT ENCOUNTER      Pt Name: Deborah Riley  MRN: [id number]  Birthdate 06/23/1975  Date of evaluation: 02/07/2018  Provider: Sonia BallerMolly E Jullian Clayson, PA-C    This patient was not seen and evaluated by the attending physician No att. providers found.    CHIEF COMPLAINT       Chief Complaint   Patient presents with   ??? Chest Pain   ??? Abdominal Pain     epigastric         HISTORYOF PRESENT ILLNESS  (Location/Symptom, Timing/Onset, Context/Setting, Quality, Duration, Modifying Factors, Severity.)   Deborah Scotthagi Iams is a 42 y.o. female who presents to the emergency department complaining of epigastric abdominal pain, chest pain and cough.  Symptoms started yesterday.  She reports pain is constant and starts in her mid epigastric abdomen and then radiates to mid sternum and right upper chest.  Pain worsens if she takes a deep breath or if she coughs or sneezes.  She reports pain as a dull fullness and heaviness and burning sensation.  She rates pain 10 out of 10.  She reports associated nonproductive cough and shortness of breath as well as nausea without vomiting.  She states she has a cold and has been sneezing and having rhinorrhea.  She thinks she has bronchitis.  She reports no history of PE or DVT, calf pain or swelling, hemoptysis, recent hospitalization, immobilization or surgery, recent trauma.  She has no significant past medical history other than H. pylori and depression.  She is not on any medication currently and has been taking nothing for her symptoms.       Nursing Notes were reviewedand I agree.    REVIEW OF SYSTEMS    (2-9 systems for level 4, 10 or more forlevel 5)     Review of Systems   Constitutional: Negative for chills and fever.   HENT: Positive for congestion, rhinorrhea and sneezing. Negative for sore throat.    Respiratory: Positive for cough and shortness of breath.    Cardiovascular: Positive for chest pain.   Gastrointestinal: Positive for  abdominal pain and nausea. Negative for constipation, diarrhea and vomiting.   Genitourinary: Negative for difficulty urinating and dysuria.   Musculoskeletal: Negative for back pain.   Skin: Negative for rash.   Neurological: Negative for light-headedness and headaches.   Psychiatric/Behavioral: The patient is not nervous/anxious.    All other systems reviewed and are negative.    Except as noted above the remainder ofthe review of systems was reviewed and negative.       PAST MEDICALHISTORY         Diagnosis Date   ??? H. pylori infection        SURGICAL HISTORY     History reviewed. No pertinent surgical history.    CURRENT MEDICATIONS       Previous Medications    VITAMIN D (ERGOCALCIFEROL) 50000 UNITS CAPS CAPSULE    Take 1 capsule by mouth once a week       ALLERGIES     Patient has no known allergies.    FAMILY HISTORY           Problem Relation Age of Onset   ??? Heart Disease Father      Family Status   Relation Name Status   ??? Father  (Not Specified)        SOCIAL HISTORY    reports that she has quit smoking. She has never used  smokeless tobacco. She reports that she does not drink alcohol or use drugs.    PHYSICAL EXAM    (up to 7 for level 4, 8 or more for level 5)     ED Triage Vitals   BP Temp Temp Source Pulse Resp SpO2 Height Weight   02/07/18 1418 02/07/18 1418 02/07/18 1418 02/07/18 1418 02/07/18 1418 02/07/18 1418 -- 02/07/18 1414   109/72 98.7 ??F (37.1 ??C) Oral 68 15 100 %  141 lb 8.6 oz (64.2 kg)       Physical Exam   Constitutional: She is oriented to person, place, and time. She appears well-developed and well-nourished. No distress.   HENT:   Head: Normocephalic and atraumatic.   Mouth/Throat: Oropharynx is clear and moist.   Neck: Neck supple.   Cardiovascular: Normal rate, regular rhythm and normal heart sounds.   Pulmonary/Chest: Effort normal and breath sounds normal. No respiratory distress.   Abdominal: Soft. Bowel sounds are normal. There is tenderness (minimally epigastric and mid  suprapubic). There is no rebound and no guarding.   Musculoskeletal: Normal range of motion. She exhibits no edema.   Neurological: She is alert and oriented to person, place, and time.   Skin: Skin is warm and dry.   Psychiatric: She has a normal mood and affect. Her behavior is normal.   Nursing note and vitals reviewed.         DIAGNOSTIC RESULTS     EKG:  12-lead EKG interpreted as NSR with a rate of 64 bpm. No ST elevation or depression on my review. Please see Dr. Budd Palmer note for full interpretation.     RADIOLOGY:   Non-plain film images such as CT, Ultrasound and MRI are read by the radiologist.Plain radiographic images are visualized and preliminarily interpreted by Sonia Baller, PA-C with the below findings:        Interpretation per the Radiologist below, if available at the time of this note:    XR CHEST STANDARD (2 VW)   Final Result   No acute findings             LABS:  Labs Reviewed   CBC WITH AUTO DIFFERENTIAL - Abnormal; Notable for the following components:       Result Value    MPV 11.3 (*)     All other components within normal limits    Narrative:     Performed at:  [facility]  [address] [place], [place] [address]   Phone [phone]   BASIC METABOLIC PANEL W/ REFLEX TO MG FOR LOW K - Abnormal; Notable for the following components:    Potassium reflex Magnesium 3.4 (*)     Glucose 114 (*)     CREATININE 0.5 (*)     All other components within normal limits    Narrative:     Performed at:  [facility]  [address] [place], [place] [address]   Phone [phone]   HCG, SERUM, QUALITATIVE    Narrative:     Performed at:  [facility]  [address] [place], [place] [address]   Phone [phone]   TROPONIN    Narrative:     Performed at:  [facility]  [address] [place], [place] [address]   Phone [phone]   LIPASE    Narrative:  Performed at:  [facility]  [address] [place], [place] [address]   Phone [phone]   MAGNESIUM    Narrative:     Performed at:  [facility]  [address] [place], [place] [address]   Phone [phone]   URINE RT REFLEX TO CULTURE    Narrative:     Performed at:  [facility]  [address] [place], [place] [address]   Phone [phone]       All other labs were within normal range or not returned as of this dictation.    EMERGENCY DEPARTMENT COURSE and DIFFERENTIAL DIAGNOSIS/MDM:   Vitals:    Vitals:    02/07/18 1414 02/07/18 1418 02/07/18 1446   BP:  109/72 110/80   Pulse:  68 68   Resp:  15 20   Temp:  98.7 ??F (37.1 ??C)    TempSrc:  Oral    SpO2:  100% 100%   Weight: 141 lb 8.6 oz (64.2 kg)          I have evaluated this patient.  My supervising physician was available for consultation.  Patient is nontoxic and afebrile.  She is in no acute distress.  Although she is complaining of chest pain, I have a suspicion that this is related to her known gastritis and the fact that she has been out of her Prilosec and Carafate.  I have very low suspicion for pulmonary embolism and was able to rule out based on the [facility] rule calculator (age less than 50, saturation 100% on room air, pulse less than 100, no unilateral calf swelling, no recent travel or hospitalizations, no trauma, no hemoptysis, no history of DVT/PE, no exogenous estrogen).  Her EKG was unremarkable.  Urinalysis clear.  She is not pregnant.  Normal white blood cell count.  Stable H&H.  Grossly normal electrolytes.  Normal renal function.  Troponin less than 0.01, she has had constant pain since yesterday and has a heart score of 0. Lipase 25.  Chest x-ray clear.  After treatment with a GI cocktail, Zofran and Pepcid, the patient is feeling a lot better.  We discussed restarting her Prilosec and Carafate and I have referred her to follow-up  with gastroenterology as an outpatient. Interpreter # [phone] used to go over lab results and plan.  The patient was comfortable with plan.  She was discharged in stable and improved condition.     Discussed results, diagnosis and plan with patient and/or family. Questions addressed. Dispositionand follow-up agreed upon. Specific discharge instructions explained. The patient and/or family and I have discussed the diagnosis and risks, and we agree with discharging home to follow-up with their primary care,specialist or referral doctor. We also discussed returning to the Emergency Department immediately if new or worsening symptoms occur. We have discussed the symptoms which are most concerning that necessitate immediatereturn.    PROCEDURES:  None    FINAL IMPRESSION      1. Abdominal pain, epigastric    2. Chest pain, unspecified type    3. Cough    4. H pylori ulcer          DISPOSITION/PLAN   DISPOSITION Decision To Discharge 02/07/2018 04:43:15 PM      PATIENT REFERRED TO:  Walker Kehr, MD  [phone] [facility]. [address]  [place] [address]  [phone]    Schedule an appointment as soon  as possible for a visit         MEDICATIONS:  New Prescriptions    BENZONATATE (TESSALON PERLES) 100 MG CAPSULE    Take 1 capsule by mouth 3 times daily as needed for Cough       (Please note that portions of this note were completed with a voice recognition program.  Efforts were made toedit the dictations but occasionally words are mis-transcribed.)    Sonia Baller, PA-C        Sonia Baller, PA-C  02/07/18 1651

## 2018-02-07 NOTE — ED Notes (Signed)
Pt presents with mid epigastric pain and worsens with cough. Pt states she has been coughing since yesterday. Denies any production of cough, fever. Denies taking anything for symptoms. States she has been nauseated but denies vomiting. Pt states she doesn't like to eat. Pt complains of lower ab pain on palpation. VSS.       Charleston Ropes, RN  02/07/18 1447

## 2018-02-07 NOTE — ED Notes (Signed)
Discharge and education instructions reviewed. Patient instructed to follow-up as noted - return to emergency department if symptoms worsen. Patient verbalized understanding. Patient denied questions at this time. No acute distress noted. Discharged per provider with discharge instructions.          Charleston Ropesrystal Jamilette Suchocki, RN  02/07/18 [phone]

## 2018-02-08 LAB — EKG 12-LEAD
Atrial Rate: 64 {beats}/min
P Axis: 46 degrees
P-R Interval: 140 ms
Q-T Interval: 384 ms
QRS Duration: 78 ms
QTc Calculation (Bazett): 396 ms
R Axis: 0 degrees
T Axis: 3 degrees
Ventricular Rate: 64 {beats}/min

## 2018-05-02 ENCOUNTER — Emergency Department: Admit: 2018-05-02 | Payer: MEDICAID | Primary: Family

## 2018-05-02 ENCOUNTER — Inpatient Hospital Stay: Admit: 2018-05-02 | Discharge: 2018-05-02 | Disposition: A | Discharge status: Home / Self Care | Payer: MEDICAID

## 2018-05-02 DIAGNOSIS — R1013 Epigastric pain: Secondary | ICD-10-CM

## 2018-05-02 LAB — COMPREHENSIVE METABOLIC PANEL
ALT: 11 U/L (ref 10–40)
AST: 16 U/L (ref 15–37)
Albumin/Globulin Ratio: 0.9 — ABNORMAL LOW (ref 1.1–2.2)
Albumin: 3.4 g/dL (ref 3.4–5.0)
Alkaline Phosphatase: 58 U/L (ref 40–129)
Anion Gap: 10 (ref 3–16)
BUN: 9 mg/dL (ref 7–20)
CO2: 25 mmol/L (ref 21–32)
Calcium: 8.6 mg/dL (ref 8.3–10.6)
Chloride: 102 mmol/L (ref 99–110)
Creatinine: 0.9 mg/dL (ref 0.6–1.1)
GFR African American: >60 (ref >60)
GFR Non-African American: >60 (ref >60)
Globulin: 3.6 g/dL
Glucose: 149 mg/dL — ABNORMAL HIGH (ref 70–99)
Potassium: 3.3 mmol/L — ABNORMAL LOW (ref 3.5–5.1)
Sodium: 137 mmol/L (ref 136–145)
Total Bilirubin: <0.2 mg/dL (ref 0.0–1.0)
Total Protein: 7 g/dL (ref 6.4–8.2)

## 2018-05-02 LAB — CBC WITH AUTO DIFFERENTIAL
Basophils %: 0.4 %
Basophils Absolute: 0 10*3/uL (ref 0.0–0.2)
Eosinophils %: 2.5 %
Eosinophils Absolute: 0.2 10*3/uL (ref 0.0–0.6)
Hematocrit: 34.3 % — ABNORMAL LOW (ref 36.0–48.0)
Hemoglobin: 11.5 g/dL — ABNORMAL LOW (ref 12.0–16.0)
Lymphocytes %: 32 %
Lymphocytes Absolute: 2.2 10*3/uL (ref 1.0–5.1)
MCH: 30 pg (ref 26.0–34.0)
MCHC: 33.4 g/dL (ref 31.0–36.0)
MCV: 89.8 fL (ref 80.0–100.0)
MPV: 11.2 fL — ABNORMAL HIGH (ref 5.0–10.5)
Monocytes %: 7.8 %
Monocytes Absolute: 0.5 10*3/uL (ref 0.0–1.3)
Neutrophils %: 57.3 %
Neutrophils Absolute: 4 10*3/uL (ref 1.7–7.7)
Platelets: 136 10*3/uL (ref 135–450)
RBC: 3.83 M/uL — ABNORMAL LOW (ref 4.00–5.20)
RDW: 12.9 % (ref 12.4–15.4)
WBC: 7 10*3/uL (ref 4.0–11.0)

## 2018-05-02 LAB — EKG 12-LEAD
Atrial Rate: 63 {beats}/min
P Axis: 42 degrees
P-R Interval: 144 ms
Q-T Interval: 398 ms
QRS Duration: 74 ms
QTc Calculation (Bazett): 407 ms
R Axis: 9 degrees
T Axis: 5 degrees
Ventricular Rate: 63 {beats}/min

## 2018-05-02 LAB — LIPASE: Lipase: 28 U/L (ref 13.0–60.0)

## 2018-05-02 LAB — TROPONIN: Troponin: <0.01 ng/mL (ref <0.01)

## 2018-05-02 LAB — BRAIN NATRIURETIC PEPTIDE: Pro-BNP: 119 pg/mL (ref 0–124)

## 2018-05-02 MED ORDER — ALUM & MAG HYDROXIDE-SIMETH 200-200-20 MG/5ML PO SUSP
200-200-20 MG/5ML | Freq: Once | ORAL | Status: DC
Start: 2018-05-02 — End: 2018-05-02

## 2018-05-02 MED ORDER — KETOROLAC TROMETHAMINE 30 MG/ML IJ SOLN
30 MG/ML | Freq: Once | INTRAMUSCULAR | Status: AC
Start: 2018-05-02 — End: 2018-05-02
  Administered 2018-05-02: 07:00:00 15 mg via INTRAVENOUS

## 2018-05-02 MED ORDER — OMEPRAZOLE 20 MG PO CPDR
20 MG | ORAL_CAPSULE | Freq: Two times a day (BID) | ORAL | 0 refills | Status: DC
Start: 2018-05-02 — End: 2018-08-20

## 2018-05-02 MED ORDER — FAMOTIDINE 20 MG PO TABS
20 MG | Freq: Once | ORAL | Status: AC
Start: 2018-05-02 — End: 2018-05-02
  Administered 2018-05-02: 07:00:00 20 mg via ORAL

## 2018-05-02 MED FILL — FAMOTIDINE 20 MG PO TABS: 20 mg | ORAL | Qty: 1

## 2018-05-02 MED FILL — KETOROLAC TROMETHAMINE 30 MG/ML IJ SOLN: 30 mg/mL | INTRAMUSCULAR | Qty: 1

## 2018-05-02 NOTE — ED Provider Notes (Signed)
[facility] EMERGENCY DEPARTMENT  EMERGENCY DEPARTMENT ENCOUNTER      Pt Name: Deborah Riley  MRN: [id number]  Birthdate 02-23-1976  Date of evaluation: 05/02/2018  Provider: Irven Catano, PA    This patient was not seen and evaluated by the attending physician No att. providers found.    CHIEF COMPLAINT       Chief Complaint   Patient presents with   ??? Chest Pain     on going for a few months, reports pain is constant with whatever she does, has cardiologist appointment 1/16.        CRITICAL CARE TIME   I performed a total Critical Care time of  15 minutes, excluding separately reportable procedures.  There was a high probability of clinically significant/life threatening deterioration in the patient's condition which required my urgent intervention. Not limited to multiple reexaminations, discussions with attending physician and consultants.      HISTORY OF PRESENT ILLNESS  (Location/Symptom, Timing/Onset, Context/Setting, Quality, Duration, Modifying Factors, Severity.)   Deborah Riley is a 42 y.o. female who presents to the emergency department accompanied by family member.  I did utilize a Education officer, environmental to speak with the patient.  She has been having epigastric pain into her chest for 2 or 3 months.  She states it was better when she was being treated for H. pylori.  She denies any recent travel does not smoke.  No calf tenderness or leg swelling.  She is not on hormones or birth control.  The pain is been worse for couple of days.  She states that she tried some over-the-counter medications for acid reflux with minimal relief.  No productive cough no hemoptysis.    Nursing Notes were reviewed and I agree.    REVIEW OF SYSTEMS    (2-9 systems for level 4, 10 or more for level 5)     Review of Systems   Constitutional: Negative for fever.   Respiratory: Negative for shortness of breath.    Cardiovascular: Positive for chest pain.   Gastrointestinal: Positive for abdominal pain. Negative  for diarrhea and nausea.   Musculoskeletal: Negative for back pain.   Skin: Negative for color change, rash and wound.   Neurological: Negative for weakness and numbness.   Psychiatric/Behavioral: Negative for agitation, behavioral problems and confusion.        Except as noted above the remainder of the review of systems was reviewed and negative.       PAST MEDICAL HISTORY         Diagnosis Date   ??? H. pylori infection        SURGICAL HISTORY     History reviewed. No pertinent surgical history.    CURRENT MEDICATIONS       Current Discharge Medication List      CONTINUE these medications which have NOT CHANGED    Details   ondansetron (ZOFRAN ODT) 8 MG TBDP disintegrating tablet Take 1 tablet by mouth every 8 hours as needed for Nausea  Qty: 12 tablet, Refills: 1    Associated Diagnoses: H pylori ulcer      sucralfate (CARAFATE) 1 GM/10ML suspension Take 10 mLs by mouth 4 times daily  Qty: 1200 mL, Refills: 0    Associated Diagnoses: H pylori ulcer      vitamin D (ERGOCALCIFEROL) 50000 units CAPS capsule Take 1 capsule by mouth once a week  Qty: 12 capsule, Refills: 0    Associated Diagnoses: Vitamin D deficiency  ALLERGIES     Patient has no known allergies.    FAMILY HISTORY           Problem Relation Age of Onset   ??? Heart Disease Father      Family Status   Relation Name Status   ??? Father  (Not Specified)        SOCIAL HISTORY      reports that she has quit smoking. She has never used smokeless tobacco. She reports that she does not drink alcohol or use drugs.    PHYSICAL EXAM    (up to 7 for level 4, 8 or more for level 5)     ED Triage Vitals   BP Temp Temp Source Pulse Resp SpO2 Height Weight   05/02/18 0122 05/02/18 0128 05/02/18 0128 05/02/18 0122 05/02/18 0122 05/02/18 0122 -- 05/02/18 0122   111/73 97.8 ??F (36.6 ??C) Oral 59 18 100 %  147 lb 14.9 oz (67.1 kg)     Physical Exam  Vitals signs and nursing note reviewed.   Constitutional:       Appearance: Normal appearance.   HENT:      Head:  Normocephalic and atraumatic.   Eyes:      Pupils: Pupils are equal, round, and reactive to light.   Cardiovascular:      Rate and Rhythm: Normal rate.      Pulses: Normal pulses.   Pulmonary:      Effort: Pulmonary effort is normal. No respiratory distress.   Abdominal:      Tenderness: There is no tenderness. There is no guarding or rebound.   Musculoskeletal: Normal range of motion.   Skin:     General: Skin is warm.   Neurological:      General: No focal deficit present.      Mental Status: She is alert and oriented to person, place, and time.   Psychiatric:         Behavior: Behavior normal.         DIAGNOSTIC RESULTS     EKG: All EKG's are interpreted by Irven [place], PA in the absence of a cardiologist.    EKG interpreted by myself - please refer to attending physician's note for complete EKG interpretation:    Rhythm: sinus rhythm   Rate: nl  No evidence of acute ischemia or injury.    RADIOLOGY:   Non-plain film images such as CT, Ultrasound and MRI are read by the radiologist. Plain radiographic images are visualized and preliminarily interpreted by Irven [place], PA with the below findings:    Reviewed radiologist dictation.    Interpretation per the Radiologist below, if available at the time of this note:    XR CHEST STANDARD (2 VW)   Final Result   No acute findings.               LABS:  Labs Reviewed   CBC WITH AUTO DIFFERENTIAL - Abnormal; Notable for the following components:       Result Value    RBC 3.83 (*)     Hemoglobin 11.5 (*)     Hematocrit 34.3 (*)     MPV 11.2 (*)     All other components within normal limits    Narrative:     Performed at:  [facility]  [address] [place], [place] [address]   Phone [phone]   COMPREHENSIVE METABOLIC PANEL - Abnormal; Notable for the following components:  Potassium 3.3 (*)     Glucose 149 (*)     Albumin/Globulin Ratio 0.9 (*)     All other components within normal limits    Narrative:     Performed at:  [facility]  [address]  [place], [place] [id number]   Phone [phone]   TROPONIN    Narrative:     Performed at:  [facility]  [address]  [place], [place] [id number]   Phone [phone]   BRAIN NATRIURETIC PEPTIDE    Narrative:     Performed at:  [facility]  [address]  [place], [place] [id number]   Phone [phone]   LIPASE    Narrative:     Performed at:  [facility]  [address]  [place], [place] [id number]   Phone [phone]       All other labs were within normal range or not returned as of this dictation.    EMERGENCY DEPARTMENT COURSE and DIFFERENTIAL DIAGNOSIS/MDM:   Vitals:    Vitals:    05/02/18 0122 05/02/18 0128   BP: 111/73    Pulse: 59    Resp: 18    Temp:  97.8 ??F (36.6 ??C)   TempSrc:  Oral   SpO2: 100%    Weight: 147 lb 14.9 oz (67.1 kg)      I discussed with Marilla Polidore and/or family the exam results, diagnosis, care, prognosis, reasons to return and the importance of follow up. Patient and/or family is in full agreement with plan and all questions have been answered.  Specific discharge instructions explained, including reasons to return to the emergency department.Aldene Escalera is well appearing, non-toxic, and afebrile at the time of discharge.     Patient has epigastric pain.  No rebound, guarding or rigidity.  Has had this pain for months.  Has been seen in the past and states that no one can tell her what is wrong.  Has an appoint with cardiology in January.  She is PERC negative with a heart score of 0.  Has had pain for 2 or 3 months troponin negative.  Chest x-ray clear.  EKG is interpreted by the attending physician.  No elevation in LFTs or lipase.  Felt much better and is asleep on serial exam after Toradol and pepcid.  Declined GI cocktail.  Will discharge with refill of omeprazole and return for any new, worsening or other  concerns.    I estimate there is LOW risk for PULMONARY EMBOLISM, ACUTE CORONARY SYNDROME, OR THORACIC AORTIC DISSECTION, thus I consider the discharge disposition reasonable.     CONSULTS:  None    PROCEDURES:  None    FINAL IMPRESSION      1. Abdominal pain, epigastric    2. H pylori ulcer          DISPOSITION/PLAN   DISPOSITION Decision To Discharge 05/02/2018 02:48:02 AM      PATIENT REFERRED TO:  Laqueta DueAmanda M Metz, APRN - CNP  [address]  [place] [id number]  [phone]    Call in 2 days  For follow up      DISCHARGE MEDICATIONS:  Current Discharge Medication List          (Please note that portions of this note were completed with a voice recognition program.  Efforts were made to edit the dictations but occasionally  words are mis-transcribed.)    Irven [place], PA       Irven [place], [place]  05/02/18 [phone]

## 2018-05-26 NOTE — Progress Notes (Signed)
Name_______________________________________Printed:____________________  Date and time of surgery___1/16/20  0730_____________________Arrival Time:___[facility]  0600_____________   1. Do not eat or drink anything after 12 midnight (or____hours) prior to surgery. This includes no water, chewing gum or mints.Endoscopy patients follow your doctors bowel prep instructions,which may include taking part of prep after midnight If you have been instructed on ERAS or carb loading -please follow those instructions.   2. Take the following pills with a small sip of water on the morning of surgery________none___________________________________________                  Do not take blood pressure medications ending in pril or sartan the eve prior to surgery or the morning of surgery_   3. Aspirin, Ibuprofen, Advil, Naproxen, Vitamin E and other Anti-inflammatory products and supplements should be stopped for 5 -7days before surgery or as directed by your physician.   4. Check with your Doctor regarding stopping Plavix, Coumadin,Eliquis, Lovenox,Effient,Pradaxa,Xarelto, Fragmin or other blood thinners and follow their instructions.   5. Do not smoke, and do not drink any alcoholic beverages 24 hours prior to surgery.  This includes NA Beer.Refrain from the usage of any recreational drugs.   6. You may brush your teeth and gargle the morning of surgery.  DO NOT SWALLOW WATER   7. You MUST make arrangements for a responsible adult to stay on site while you are here and take you home after your surgery. You will not be allowed to leave alone or drive yourself home.  It is strongly suggested someone stay with you the first 24 hrs. Your surgery will be cancelled if you do not have a ride home.   8. A parent/legal guardian must accompany a child scheduled for surgery and plan to stay at the hospital until the child is discharged.  Please do not bring other children with you.   9. Please wear simple, loose fitting clothing to the hospital.  Do  not bring valuables (money, credit cards, checkbooks, etc.) Do not wear any makeup (including no eye makeup) or nail polish on your fingers or toes.             10. DO NOT wear any jewelry or piercings on day of surgery.  All body piercing jewelry must be removed.             11. If you have ___dentures, they will be removed before going to the OR; we will provide you a container.  If you wear ___contact lenses or ___glasses, they will be removed; please bring a case for them.             12. Please see your family doctor/pediatrician for a history & physical and/or concerning medications.  Bring any test results/reports from your physician's office.   PCP__________________Phone___________H&P Appt. Date________             13 If you  have a Living Will and Durable Power of Attorney for Healthcare, please bring in a copy.             14. Notify your Surgeon if you develop any illness between now and surgery  time, cough, cold, fever, sore throat, nausea, vomiting, etc.  Please notify your surgeon if you experience dizziness, shortness of breath or blurred vision between now & the time of your surgery             15. DO NOT shave your operative site 96 hours prior to surgery. For face & neck surgery, men  may use an electric razor 48 hours prior to surgery.             16. Shower the night before or morning of surgery as directed.             17. To provide excellent care visitors will be limited to one in the room at any given time.             18.  Please bring picture ID and insurance card.             19.  Visit our web site for additional information:  [email or web address]              20.During flu season no children under the age of 43 are permitted in the hospital for the safety of all patients.                              21. If you take a long acting insulin in the evening only  take half of your usual  dose the night  before your procedure              22. If you use a c-pap please bring DOS if staying  overnight,             23.For [facility] to [facility] prescriptions.             24. If you use oxygen and have a portable tank please bring it  with you the DOS             25. Bring a complete list of all your medications with name and dose include any supplements.             26. Other__________________________________________   *Please call pre admission testing if you any further questions   [facility]         [phone]   [facility] [phone]   [facility]            [phone]    [place]  [phone]   [facility]   [phone]       All above information reviewed with patient in person or by phone using [employer] Nepali interpreter [phone]. Patient verbalizes understanding.All questions and concerns addressed.                                                                                                 Patient/Rep____________________  Nepali interpreter arranged through [employer] to meet pt.masc lobby [phone]. Job # [id number].

## 2018-06-11 ENCOUNTER — Inpatient Hospital Stay: Payer: MEDICAID

## 2018-06-11 LAB — HCG, SERUM, QUALITATIVE: hCG Qual: NEGATIVE

## 2018-06-11 MED ORDER — LIDOCAINE HCL 2 % IJ SOLN
2 % | INTRAMUSCULAR | Status: DC | PRN
Start: 2018-06-11 — End: 2018-06-11
  Administered 2018-06-11: 13:00:00 80 via INTRAVENOUS

## 2018-06-11 MED ORDER — PROPOFOL 200 MG/20ML IV EMUL
200 MG/20ML | INTRAVENOUS | Status: DC | PRN
Start: 2018-06-11 — End: 2018-06-11
  Administered 2018-06-11: 14:00:00 40 via INTRAVENOUS
  Administered 2018-06-11: 13:00:00 100 via INTRAVENOUS
  Administered 2018-06-11: 14:00:00 40 via INTRAVENOUS

## 2018-06-11 MED ORDER — PROMETHAZINE HCL 25 MG/ML IJ SOLN
25 MG/ML | Freq: Once | INTRAMUSCULAR | Status: DC | PRN
Start: 2018-06-11 — End: 2018-06-11

## 2018-06-11 MED ORDER — SODIUM CHLORIDE 0.9 % IV SOLN
0.9 | INTRAVENOUS | Status: DC
Start: 2018-06-11 — End: 2018-06-11
  Administered 2018-06-11: 13:00:00 via INTRAVENOUS

## 2018-06-11 MED ORDER — LABETALOL HCL 5 MG/ML IV SOLN
5 MG/ML | INTRAVENOUS | Status: DC | PRN
Start: 2018-06-11 — End: 2018-06-11

## 2018-06-11 MED ORDER — LIDOCAINE HCL (PF) 1 % IJ SOLN
1 % | Freq: Once | INTRAMUSCULAR | Status: DC | PRN
Start: 2018-06-11 — End: 2018-06-11

## 2018-06-11 MED ORDER — ONDANSETRON HCL 4 MG/2ML IJ SOLN
4 MG/2ML | Freq: Once | INTRAMUSCULAR | Status: DC | PRN
Start: 2018-06-11 — End: 2018-06-11

## 2018-06-11 MED ORDER — SIMETHICONE 40 MG/0.6ML PO SUSP
40 MG/0.6ML | ORAL | Status: DC | PRN
Start: 2018-06-11 — End: 2018-06-11
  Administered 2018-06-11: 13:00:00

## 2018-06-11 NOTE — Progress Notes (Signed)
To endo recovery from procedure room. VSS, awakens to voice, respirations easy and unlabored.

## 2018-06-11 NOTE — Discharge Instructions (Signed)
EGD discharge instructions    Use salt water gargle, lozenges, or Chloraseptic spray as needed for sore throat.  If you have fever, chills, excessive bleeding, severe chest pain, or abdominal pain, or any other problems, contact your physician's office immediately at [phone].  You need another EGD in _______________________________  Continue home medications as directed.  Call physician's office in five to seven business days for biopsy results or further instructions.  Call your physician at [phone] for a follow up appointment in ______________  See your physician's report for procedure details and recommendations.    If you had an esophageal dilatation, and experience fever, chills, excessive bleeding, shortness of breath, chest or abdominal pain, or any other unusual symptom, call the office immediately at [phone].      ANESTHESIA DISCHARGE INSTRUCTIONS     Wear your seatbelt home.   You are under the influence of drugs-do not drink alcohol, drive ,operate machinery,or make any important decisions or sign any legal documentsfor 24 hours. You may resume normal activities tomorrow.   A responsible adult needs to be with you for 24 hours.   You may experience lightheadedness,dizziness,or sleepiness following surgery.   Rest at home today- increase activity as tolerated. It is recommended to take a four hour nap after procedure.   Progress slowly to a regular diet unless your physician has instructed you otherwise. Avoid spicy and greasy food on first meal.  Drink plenty of water.   If nausea becomes a problem call your physician.   Call your doctor if concerns arise.

## 2018-06-11 NOTE — Progress Notes (Signed)
Name:  Deborah Riley  Age:  43 y.o.  CSN:  [id number]            Past Medical History:        Diagnosis Date   . Depression    . H. pylori infection        Past Surgical History:      Procedure Laterality Date   . OTHER SURGICAL HISTORY      none       Medications Prior to Admission:  Medications Prior to Admission: omeprazole (PRILOSEC) 20 MG delayed release capsule, Take 1 capsule by mouth 2 times daily (before meals)  ondansetron (ZOFRAN ODT) 8 MG TBDP disintegrating tablet, Take 1 tablet by mouth every 8 hours as needed for Nausea  sucralfate (CARAFATE) 1 GM/10ML suspension, Take 10 mLs by mouth 4 times daily  vitamin D (ERGOCALCIFEROL) 50000 units CAPS capsule, Take 1 capsule by mouth once a week    Allergies:  Patient has no known allergies.    Social History:  Social History     Socioeconomic History   . Marital status: Widowed     Spouse name: Not on file   . Number of children: 4   . Years of education: 3 months to learn english. No school in [place]     . Highest education level: Not on file   Occupational History   . Not on file   Social Needs   . Financial resource strain: Not on file   . Food insecurity:     Worry: Not on file     Inability: Not on file   . Transportation needs:     Medical: Not on file     Non-medical: Not on file   Tobacco Use   . Smoking status: Former Games developer   . Smokeless tobacco: Never Used   Substance and Sexual Activity   . Alcohol use: No     Frequency: Never   . Drug use: No   . Sexual activity: Never     Comment: married   Lifestyle   . Physical activity:     Days per week: Not on file     Minutes per session: Not on file   . Stress: Not on file   Relationships   . Social connections:     Talks on phone: Not on file     Gets together: Not on file     Attends religious service: Not on file     Active member of club or organization: Not on file     Attends meetings of clubs or organizations: Not on file     Relationship status: Not on file   . Intimate partner violence:     Fear of  current or ex partner: Not on file     Emotionally abused: Not on file     Physically abused: Not on file     Forced sexual activity: Not on file   Other Topics Concern   . Not on file   Social History Narrative   . Not on file       Family History:      Problem Relation Age of Onset   . Heart Disease Father        Vital Signs:  Vitals:    06/11/18 0732   BP: 118/78   Pulse: 62   Resp: 16   Temp: 98 F (36.7 C)   SpO2: 100%

## 2018-06-11 NOTE — Progress Notes (Signed)
Teaching / education initiated regarding perioperative experience, expectations, and pain management during stay. Patient verbalized understanding.    Nepali speaking patient.  Language line utilized.  Operator [phone].

## 2018-06-11 NOTE — Anesthesia Pre-Procedure Evaluation (Signed)
[facility] Department of Anesthesiology  Pre-Anesthesia Evaluation/Consultation       Name:  Deborah Riley  DOB: 07-12-1975  Age:  43 y.o.                                           MRN:  [id number]  Date: 06/11/2018           Surgeon: Surgeon(s):  Waynard Reeds, MD    Procedure: Procedure(s):  EGD DIAGNOSTIC ONLY     No Known Allergies  Patient Active Problem List   Diagnosis   ??? Vitamin D deficiency   ??? Psychosis ([place])   ??? Generalized abdominal pain   ??? 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 Depressive disorder     Past Medical History:   Diagnosis Date   ??? Depression    ??? H. pylori infection      Past Surgical History:   Procedure Laterality Date   ??? OTHER SURGICAL HISTORY      none     Social History     Tobacco Use   ??? Smoking status: Former Smoker   ??? Smokeless tobacco: Never Used   Substance Use Topics   ??? Alcohol use: No     Frequency: Never   ??? Drug use: No     Medications  No current facility-administered medications on file prior to encounter.      Current Outpatient Medications on File Prior to Encounter   Medication Sig Dispense Refill   ??? ondansetron (ZOFRAN ODT) 8 MG TBDP disintegrating tablet Take 1 tablet by mouth every 8 hours as needed for Nausea 12 tablet 1   ??? sucralfate (CARAFATE) 1 GM/10ML suspension Take 10 mLs by mouth 4 times daily 1200 mL 0   ??? vitamin D (ERGOCALCIFEROL) 50000 units CAPS capsule Take 1 capsule by mouth once a week 12 capsule 0     Current Facility-Administered Medications   Medication Dose Route Frequency Provider Last Rate Last Dose   ??? 0.9 % sodium chloride infusion   Intravenous Continuous Ubaldo Glassing, MD       ??? lidocaine PF 1 % injection 1 mL  1 mL Intradermal Once PRN Ubaldo Glassing, MD         Vital Signs (Current) There were no vitals filed for this visit.  Vital Signs Statistics (for past 48 hrs)     No data recorded    BP Readings from Last 3 Encounters:   05/02/18 111/73   02/07/18 125/72   11/10/17 101/67     BMI  There is no  height or weight on file to calculate BMI.  Estimated body mass index is 28.89 kg/m?? as calculated from the following:    Height as of 11/10/17: 5' (1.524 m).    Weight as of 05/02/18: 147 lb 14.9 oz (67.1 kg).    CBC   Lab Results   Component Value Date    WBC 7.0 05/02/2018    RBC 3.83 05/02/2018    HGB 11.5 05/02/2018    HCT 34.3 05/02/2018    MCV 89.8 05/02/2018    RDW 12.9 05/02/2018    PLT 136 05/02/2018     CMP    Lab Results   Component Value Date    NA 137 05/02/2018    K 3.3 05/02/2018    K 3.4 02/07/2018  CL 102 05/02/2018    CO2 25 05/02/2018    BUN 9 05/02/2018    CREATININE 0.9 05/02/2018    GFRAA >60 05/02/2018    AGRATIO 0.9 05/02/2018    LABGLOM >60 05/02/2018    GLUCOSE 149 05/02/2018    PROT 7.0 05/02/2018    CALCIUM 8.6 05/02/2018    BILITOT <0.2 05/02/2018    ALKPHOS 58 05/02/2018    AST 16 05/02/2018    ALT 11 05/02/2018     BMP    Lab Results   Component Value Date    NA 137 05/02/2018    K 3.3 05/02/2018    K 3.4 02/07/2018    CL 102 05/02/2018    CO2 25 05/02/2018    BUN 9 05/02/2018    CREATININE 0.9 05/02/2018    CALCIUM 8.6 05/02/2018    GFRAA >60 05/02/2018    LABGLOM >60 05/02/2018    GLUCOSE 149 05/02/2018     POCGlucose  No results for input(s): GLUCOSE in the last 72 hours.   Coags  No results found for: PROTIME, INR, APTT  HCG (If Applicable)   Lab Results   Component Value Date    PREGTESTUR Negative 05/03/2017      ABGs No results found for: PHART, PO2ART, PCO2ART, HCO3ART, BEART, O2SATART   Type & Screen (If Applicable)  No results found for: LABABO, LABRH                         BMI: Wt Readings from Last 3 Encounters:       NPO Status:   Date of last liquid consumption: 06/10/18   Time of last liquid consumption: 1800   Date of last solid food consumption: 06/10/18      Time of last solid consumption: 1800       Anesthesia Evaluation  Patient summary reviewed no history of anesthetic complications:   Airway: Mallampati: III  TM distance: >3 FB   Neck ROM: full   Dental: normal  exam         Pulmonary:Negative Pulmonary ROS and normal exam                               Cardiovascular:  Exercise tolerance: good (>4 METS),           Rhythm: regular  Rate: normal           Beta Blocker:  Not on Beta Blocker         Neuro/Psych:   (+) psychiatric history:            GI/Hepatic/Renal:   (+) GERD:,           Endo/Other: Negative Endo/Other ROS                    Abdominal:           Vascular: negative vascular ROS.                                       Anesthesia Plan      MAC     ASA 2     (Nepali interpreter used for procedure)  Induction: intravenous.      Anesthetic plan and risks discussed with patient.      Plan discussed with CRNA.  This pre-anesthesia assessment may be used as a history and physical.    DOS STAFF ADDENDUM:    Pt seen and examined, chart reviewed (including anesthesia, drug and allergy history).  No interval changes to history and physical examination.  Anesthetic plan, risks, benefits, alternatives, and personnel involved discussed with patient. Questions and concerns addressed.  Patient(family) verbalized an understanding and agrees to proceed.      Sonia Baller, MD  June 11, 2018  7:35 AM

## 2018-06-11 NOTE — Progress Notes (Signed)
Post-procedure education reviewed with patient and visitor, and they verbalized understanding.  Interpreter at bedside.

## 2018-06-11 NOTE — Anesthesia Post-Procedure Evaluation (Signed)
[facility] Department of Anesthesiology  Post-Anesthesia Note       Name:  Deborah Riley                                  Age:  43 y.o.  MRN:  [id number]     Last Vitals & Oxygen Saturation: BP 119/69    Pulse 70    Temp 96.7 ??F (35.9 ??C) (Tympanic)    Resp 14    Ht 5\' 1"  (1.549 m)    Wt 145 lb (65.8 kg)    LMP 05/27/2018    SpO2 100%    BMI 27.40 kg/m??   Patient Vitals for the past 4 hrs:   BP Temp Temp src Pulse Resp SpO2 Height Weight   06/11/18 0912 119/69 -- -- 70 14 100 % -- --   06/11/18 0857 119/69 -- -- 63 14 100 % -- --   06/11/18 0842 94/61 96.7 ??F (35.9 ??C) Tympanic 61 14 100 % -- --   06/11/18 0732 118/78 98 ??F (36.7 ??C) Temporal 62 16 100 % 5\' 1"  (1.549 m) 145 lb (65.8 kg)       Level of consciousness:  Awake, alert    Respiratory: Respirations easy, no distress. Stable.    Cardiovascular: Hemodynamically stable.    Hydration: Adequate.    PONV: Adequately managed.    Post-op pain: Adequately controlled.    Post-op assessment: Tolerated anesthetic well without complication.    Complications:  None.    Erlinda Hong, MD  June 11, 2018   9:29 AM

## 2018-08-20 ENCOUNTER — Inpatient Hospital Stay: Admit: 2018-08-20 | Discharge: 2018-08-20 | Disposition: A | Discharge status: Home / Self Care | Payer: MEDICAID

## 2018-08-20 ENCOUNTER — Emergency Department: Admit: 2018-08-20 | Payer: MEDICAID | Primary: Family

## 2018-08-20 DIAGNOSIS — K219 Gastro-esophageal reflux disease without esophagitis: Secondary | ICD-10-CM

## 2018-08-20 LAB — PREGNANCY, URINE: HCG(Urine) Pregnancy Test: NEGATIVE

## 2018-08-20 LAB — EKG 12-LEAD
Atrial Rate: 75 {beats}/min
P Axis: 48 degrees
P-R Interval: 152 ms
Q-T Interval: 378 ms
QRS Duration: 88 ms
QTc Calculation (Bazett): 422 ms
R Axis: -1 degrees
T Axis: -8 degrees
Ventricular Rate: 75 {beats}/min

## 2018-08-20 LAB — URINALYSIS WITH REFLEX TO CULTURE
Bilirubin Urine: NEGATIVE
Blood, Urine: NEGATIVE
Glucose, Ur: NEGATIVE mg/dL
Ketones, Urine: NEGATIVE mg/dL
Leukocyte Esterase, Urine: NEGATIVE
Nitrite, Urine: NEGATIVE
Protein, UA: NEGATIVE mg/dL
Specific Gravity, UA: 1.006 (ref 1.005–1.030)
Urobilinogen, Urine: 0.2 E.U./dL (ref <2.0)
pH, UA: 6 (ref 5.0–8.0)

## 2018-08-20 LAB — RAPID INFLUENZA A/B ANTIGENS
Rapid Influenza A Ag: NEGATIVE
Rapid Influenza B Ag: NEGATIVE

## 2018-08-20 LAB — MICROSCOPIC URINALYSIS
Epithelial Cells, UA: 14 /HPF — ABNORMAL HIGH (ref 0–5)
Hyaline Casts, UA: 3 /LPF (ref 0–8)
RBC, UA: 1 /HPF (ref 0–4)
WBC, UA: 12 /HPF — ABNORMAL HIGH (ref 0–5)

## 2018-08-20 LAB — STREP SCREEN GROUP A THROAT: Rapid Strep A Screen: NEGATIVE

## 2018-08-20 MED ORDER — ONDANSETRON 8 MG PO TBDP
8 MG | ORAL_TABLET | Freq: Three times a day (TID) | ORAL | 1 refills | Status: DC | PRN
Start: 2018-08-20 — End: 2019-02-09

## 2018-08-20 MED ORDER — OMEPRAZOLE 20 MG PO CPDR
20 MG | ORAL_CAPSULE | Freq: Two times a day (BID) | ORAL | 0 refills | Status: DC
Start: 2018-08-20 — End: 2019-03-17

## 2018-08-20 MED ORDER — SUCRALFATE 1 GM/10ML PO SUSP
1 GM/0ML | Freq: Four times a day (QID) | ORAL | 0 refills | Status: DC
Start: 2018-08-20 — End: 2019-08-30

## 2018-08-20 NOTE — ED Notes (Signed)
Acknowledged pt by pt's name.  Verified pt by name and date of birth.  Checked arm band, allergies, reviewed past medical history.  Introduced myself to patient  Duration of ED plan of care explained to patient  Explained planned tests and procedures  Thanked patient for coming to [facility].    Asked if there was anything else I could do for the patient before exiting room.  CB in reach.     Barrett Shell, RN  08/20/18 1043

## 2018-08-20 NOTE — ED Provider Notes (Signed)
[place]        Pt Name: Deborah Riley  MRN: [id number]  [place] [birth year]  Date of evaluation: 08/20/2018  Provider: Vickki Hearing, PA-C  PCP: No primary care provider on file.    Evaluation by APP.  My supervising physician was available for consultation.    CHIEF COMPLAINT       Chief Complaint   Patient presents with   ??? Chest Pain     since last night.    ??? Eye Problem     pain to left eye.    ??? Pharyngitis   ??? Abdominal Pain     lower when urinating   ??? Medication Refill     prilosec       HISTORY OF PRESENT ILLNESS   (Location, Timing/Onset, Context/Setting, Quality, Duration, Modifying Factors, Severity, Associated Signs and Symptoms)  Note limiting factors.     Deborah Riley is a 43 y.o. female who presents here to the emergency department, she states that she is been having some chest that is been tight and hurting, very slight, difficulty breathing, her eyes been swollen, she says that her symptoms are little bit worse when she walks.  She denies any nausea or vomiting, she denies any body aches, she denies any skin rashes.  She does admit to a very mild cough with minimal sputum, mild sore throat that is worse when she swallows, she does admit to some very mild dizziness and mild headaches.  She denies any fevers or chills.    Nursing Notes were all reviewed and agreed with or any disagreements were addressed in the HPI.    REVIEW OF SYSTEMS    (2-9 systems for level 4, 10 or more for level 5)     Review of Systems   Constitutional: Negative for chills, diaphoresis and fever.   HENT: Positive for sore throat. Negative for congestion, ear pain and rhinorrhea.    Eyes: Positive for pain. Negative for visual disturbance.   Respiratory: Positive for cough and shortness of breath.    Cardiovascular: Positive for chest pain. Negative for palpitations and leg swelling.   Gastrointestinal: Negative for abdominal pain, constipation,  diarrhea, nausea and vomiting.   Genitourinary: Positive for dysuria. Negative for decreased urine volume, frequency and urgency.   Musculoskeletal: Negative for back pain and neck pain.   Skin: Negative for rash and wound.   Neurological: Positive for dizziness and headaches. Negative for light-headedness.           PAST MEDICAL HISTORY     Past Medical History:   Diagnosis Date   ??? Depression    ??? H. pylori infection          SURGICAL HISTORY     Past Surgical History:   Procedure Laterality Date   ??? OTHER SURGICAL HISTORY      none   ??? UPPER GASTROINTESTINAL ENDOSCOPY N/A 06/11/2018    EGD BIOPSY performed by Waynard Reeds, MD at [facility]       Discharge Medication List as of 08/20/2018 12:31 PM      CONTINUE these medications which have NOT CHANGED    Details   vitamin D (ERGOCALCIFEROL) 50000 units CAPS capsule Take 1 capsule by mouth once a week, Disp-12 capsule, R-0Normal               ALLERGIES     Patient has  no known allergies.    FAMILYHISTORY       Family History   Problem Relation Age of Onset   ??? Heart Disease Father           SOCIAL HISTORY       Social History     Tobacco Use   ??? Smoking status: Former Smoker   ??? Smokeless tobacco: Never Used   Substance Use Topics   ??? Alcohol use: No     Frequency: Never   ??? Drug use: No       SCREENINGS             PHYSICAL EXAM    (up to 7 for level 4, 8 or more for level 5)     ED Triage Vitals [08/20/18 0954]   BP Temp Temp Source Pulse Resp SpO2 Height Weight   105/63 98.6 ??F (37 ??C) Oral 67 19 100 % 5' (1.524 m) 149 lb 4 oz (67.7 kg)       Physical Exam  Vitals signs and nursing note reviewed.   Constitutional:       Appearance: Normal appearance. She is well-developed. She is not diaphoretic.   HENT:      Head: Normocephalic and atraumatic.      Right Ear: External ear normal.      Left Ear: External ear normal.      Nose: Nose normal.   Eyes:      General: No scleral icterus.        Right eye: No discharge.         Left  eye: No discharge.      Extraocular Movements: Extraocular movements intact.      Conjunctiva/sclera: Conjunctivae normal.      Pupils: Pupils are equal, round, and reactive to light.        Comments: Pterygium noted   Neck:      Musculoskeletal: Normal range of motion and neck supple.   Cardiovascular:      Rate and Rhythm: Normal rate and regular rhythm.      Heart sounds: Normal heart sounds. No murmur. No friction rub. No gallop.    Pulmonary:      Effort: Pulmonary effort is normal. No respiratory distress.      Breath sounds: Normal breath sounds. No stridor. No wheezing or rales.   Chest:      Chest wall: No tenderness.   Abdominal:      General: Bowel sounds are normal. There is no distension or abdominal bruit.      Palpations: Abdomen is soft. Abdomen is not rigid. There is no mass or pulsatile mass.      Tenderness: There is no abdominal tenderness. There is no guarding or rebound. Negative signs include Murphy's sign and McBurney's sign.   Musculoskeletal: Normal range of motion.   Skin:     General: Skin is warm and dry.      Coloration: Skin is not pale.   Neurological:      Mental Status: She is alert and oriented to person, place, and time.   Psychiatric:         Behavior: Behavior normal.         DIAGNOSTIC RESULTS   LABS:    Labs Reviewed   URINE RT REFLEX TO CULTURE - Abnormal; Notable for the following components:       Result Value    Clarity, UA CLOUDY (*)     All other components within normal limits  Narrative:     Performed at:  [facility]  [address] [place] [address]   Phone [phone]   MICROSCOPIC URINALYSIS - Abnormal; Notable for the following components:    Bacteria, UA RARE (*)     WBC, UA 12 (*)     Epithelial Cells, UA 14 (*)     All other components within normal limits    Narrative:     Performed at:  [facility]  [address] [place] [address]   Phone [phone]   RAPID INFLUENZA  A/B ANTIGENS    Narrative:     Performed at:  [facility]  [address] [place] [address]   Phone [phone]   STREP SCREEN GROUP A THROAT    Narrative:     Performed at:  [facility]  [address] [place] [address]   Phone [phone]   CULTURE, URINE    Narrative:     ORDER#: [id number]                          ORDERED BY: Vickki Hearing  SOURCE: Urine Clean Catch                  COLLECTED:  08/20/18 10:30  ANTIBIOTICS AT COLL.:                      RECEIVED :  08/20/18 10:51  Performed at:  [facility]  [address] [place] [address]   Phone [phone]   CULTURE, BETA STREP CONFIRM PLATES    Narrative:     ORDER#: [id number]                          ORDERED BY: Vickki Hearing  SOURCE: Throat                             COLLECTED:  08/20/18 10:30  ANTIBIOTICS AT COLL.:                      RECEIVED :  08/20/18 10:38  Performed at:  [facility]  [address] [place] [address]   Phone [phone]   PREGNANCY, URINE    Narrative:     Performed at:  [facility]  [address] [place] [address]   Phone [phone]       All other labs were within normal range or not returned as of this dictation.    EKG: All EKG's are interpreted by the Emergency Department Physician in the absence of a cardiologist.  Please see their note for interpretation of EKG.      RADIOLOGY:   Non-plain film images such as CT, Ultrasound and MRI are read by the radiologist. Plain radiographic images are visualized and preliminarily interpreted by the ED Provider with the below findings:        Interpretation per the Radiologist below, if available at the time of this note:    XR CHEST PORTABLE   Final Result   No evidence of acute process.  Xr Chest Portable    Result Date: 08/20/2018  EXAMINATION: ONE  XRAY VIEW OF THE CHEST 08/20/2018 10:33 am COMPARISON: 05/02/2018 HISTORY: ORDERING SYSTEM PROVIDED HISTORY: SOB TECHNOLOGIST PROVIDED HISTORY: Reason for exam:->SOB Reason for Exam: sob Acuity: Acute Type of Exam: Initial FINDINGS: Normal heart size and pulmonary vasculature.  No focal consolidations, pleural effusions, or pneumothorax.     No evidence of acute process.           PROCEDURES   Unless otherwise noted below, none     Procedures    CRITICAL CARE TIME   N/A    CONSULTS:  None      EMERGENCY DEPARTMENT COURSE and DIFFERENTIAL DIAGNOSIS/MDM:   Vitals:    Vitals:    08/20/18 0954 08/20/18 1021 08/20/18 1101 08/20/18 1227   BP: 105/63 108/75 110/74    Pulse: 67 67 65 66   Resp: '19 28 28 16   ' Temp: 98.6 ??F (37 ??C)      TempSrc: Oral  Oral    SpO2: 100%  96%    Weight: 149 lb 4 oz (67.7 kg)      Height: 5' (1.524 m)          Patient was given the following medications:  Medications - No data to display    This patient has had similar problems when she stopped taking her Prilosec.  She has a myriad of complaints, some of which I believe are cultural, she did notice that she had a pinguecula, and that was her eye discomfort.  Her EKG and other diagnostic testing were unremarkable, nothing to suggest sepsis or cardiac etiology at this time      FINAL IMPRESSION      1. Gastroesophageal reflux disease, esophagitis presence not specified              DISPOSITION/PLAN   DISPOSITION Decision To Discharge 08/20/2018 12:10:18 PM      PATIENT REFERREDTO:  [facility]  [phone]  Call today  to arrange for an after ER visit and to establish care with a PCP, For a recheck in 1 days      DISCHARGE MEDICATIONS:  Discharge Medication List as of 08/20/2018 12:31 PM          DISCONTINUED MEDICATIONS:  Discharge Medication List as of 08/20/2018 12:31 PM      STOP taking these medications       famotidine (PEPCID) 20 MG tablet Comments:   Reason for Stopping:                      (Please note that portions of this  note were completed with a voice recognition program.  Efforts were made to edit the dictations but occasionally words are mis-transcribed.)    Vickki Hearing, PA-C (electronically signed)         Vickki Hearing, PA-C  09/09/18 1528

## 2018-08-20 NOTE — ED Notes (Signed)
MD/MLP updated on pt complaints     Barrett Shell, RN  08/20/18 1025

## 2018-08-20 NOTE — ED Notes (Signed)
Pt placed on/continued on cardiac monitor and continuous pulse ox - see flowsheet     Barrett Shell, RN  08/20/18 1043

## 2018-08-20 NOTE — ED Provider Notes (Signed)
EKG Interpretation    Interpreted by emergency department physician  Time read: 1005    Rhythm: Sinus  Ventricular Rate: 75  QRS Axis: -1  Ectopy: None  Conduction: Normal sinus rhythm  ST Segments: normal  T Waves: normal  Q Waves: None    Other findings: Minimal muscular artifact    Compared to EKG on: 05/02/2018 and is unchanged    Clinical Impression: Normal sinus rhythm, normal EKG, unchanged from her previous EKG on 05/02/2018.    Deborah Riley K [name]         [name], [place]  08/24/18 2132

## 2018-08-21 LAB — CULTURE, URINE: Urine Culture, Routine: <50000

## 2018-08-22 LAB — CULTURE, BETA STREP CONFIRM PLATES: Strep A Culture: NORMAL

## 2018-10-20 ENCOUNTER — Emergency Department: Admit: 2018-10-20 | Payer: MEDICAID | Primary: Family

## 2018-10-20 ENCOUNTER — Inpatient Hospital Stay: Admit: 2018-10-20 | Discharge: 2018-10-20 | Disposition: A | Discharge status: Home / Self Care | Payer: MEDICAID

## 2018-10-20 DIAGNOSIS — R1013 Epigastric pain: Secondary | ICD-10-CM

## 2018-10-20 LAB — CBC WITH AUTO DIFFERENTIAL
Basophils %: 1 %
Basophils Absolute: 0.1 10*3/uL (ref 0.0–0.2)
Eosinophils %: 1.6 %
Eosinophils Absolute: 0.2 10*3/uL (ref 0.0–0.6)
Hematocrit: 38.4 % (ref 36.0–48.0)
Hemoglobin: 12.9 g/dL (ref 12.0–16.0)
Lymphocytes %: 21.1 %
Lymphocytes Absolute: 2 10*3/uL (ref 1.0–5.1)
MCH: 29.9 pg (ref 26.0–34.0)
MCHC: 33.6 g/dL (ref 31.0–36.0)
MCV: 89 fL (ref 80.0–100.0)
MPV: 10.2 fL (ref 5.0–10.5)
Monocytes %: 7.9 %
Monocytes Absolute: 0.8 10*3/uL (ref 0.0–1.3)
Neutrophils %: 68.4 %
Neutrophils Absolute: 6.6 10*3/uL (ref 1.7–7.7)
Platelets: 206 10*3/uL (ref 135–450)
RBC: 4.32 M/uL (ref 4.00–5.20)
RDW: 12.7 % (ref 12.4–15.4)
WBC: 9.7 10*3/uL (ref 4.0–11.0)

## 2018-10-20 LAB — COMPREHENSIVE METABOLIC PANEL W/ REFLEX TO MG FOR LOW K
ALT: 14 U/L (ref 10–40)
AST: 20 U/L (ref 15–37)
Albumin/Globulin Ratio: 1.1 (ref 1.1–2.2)
Albumin: 3.9 g/dL (ref 3.4–5.0)
Alkaline Phosphatase: 78 U/L (ref 40–129)
Anion Gap: 6 (ref 3–16)
BUN: 15 mg/dL (ref 7–20)
CO2: 29 mmol/L (ref 21–32)
Calcium: 8.7 mg/dL (ref 8.3–10.6)
Chloride: 104 mmol/L (ref 99–110)
Creatinine: 0.7 mg/dL (ref 0.6–1.1)
GFR African American: >60 (ref >60)
GFR Non-African American: >60 (ref >60)
Globulin: 3.7 g/dL
Glucose: 94 mg/dL (ref 70–99)
Potassium reflex Magnesium: 3.8 mmol/L (ref 3.5–5.1)
Sodium: 139 mmol/L (ref 136–145)
Total Bilirubin: 0.5 mg/dL (ref 0.0–1.0)
Total Protein: 7.6 g/dL (ref 6.4–8.2)

## 2018-10-20 LAB — LIPASE: Lipase: 31 U/L (ref 13.0–60.0)

## 2018-10-20 LAB — HCG, SERUM, QUALITATIVE: hCG Qual: NEGATIVE

## 2018-10-20 LAB — TROPONIN: Troponin: <0.01 ng/mL (ref <0.01)

## 2018-10-20 MED ORDER — LIDOCAINE VISCOUS HCL 2 % MT SOLN
2 % | Freq: Once | OROMUCOSAL | Status: AC
Start: 2018-10-20 — End: 2018-10-20
  Administered 2018-10-20: 21:00:00 via ORAL

## 2018-10-20 MED ORDER — SODIUM CHLORIDE 0.9 % IV BOLUS
0.9 | Freq: Once | INTRAVENOUS | Status: AC
Start: 2018-10-20 — End: 2018-10-20
  Administered 2018-10-20: 21:00:00 1000 mL via INTRAVENOUS

## 2018-10-20 MED ORDER — PANTOPRAZOLE SODIUM 40 MG IV SOLR
40 MG | Freq: Once | INTRAVENOUS | Status: AC
Start: 2018-10-20 — End: 2018-10-20
  Administered 2018-10-20: 21:00:00 40 mg via INTRAVENOUS

## 2018-10-20 MED ORDER — OMEPRAZOLE 20 MG PO CPDR
20 MG | ORAL_CAPSULE | Freq: Two times a day (BID) | ORAL | 0 refills | Status: DC
Start: 2018-10-20 — End: 2019-03-17

## 2018-10-20 MED ORDER — FAMOTIDINE 20 MG/2ML IV SOLN
202 MG/2ML | Freq: Once | INTRAVENOUS | Status: AC
Start: 2018-10-20 — End: 2018-10-20
  Administered 2018-10-20: 21:00:00 20 mg via INTRAVENOUS

## 2018-10-20 MED ORDER — ONDANSETRON 4 MG PO TBDP
4 MG | ORAL_TABLET | Freq: Three times a day (TID) | ORAL | 0 refills | Status: DC | PRN
Start: 2018-10-20 — End: 2019-02-09

## 2018-10-20 MED ORDER — ONDANSETRON HCL 4 MG/2ML IJ SOLN
4 MG/2ML | Freq: Once | INTRAMUSCULAR | Status: AC
Start: 2018-10-20 — End: 2018-10-20
  Administered 2018-10-20: 21:00:00 4 mg via INTRAVENOUS

## 2018-10-20 MED ORDER — SUCRALFATE 1 G PO TABS
1 GM | ORAL_TABLET | Freq: Four times a day (QID) | ORAL | 0 refills | Status: DC | PRN
Start: 2018-10-20 — End: 2019-08-26

## 2018-10-20 MED FILL — MAG-AL PLUS 200-200-20 MG/5ML PO LIQD: 200-200-20 MG/5ML | ORAL | Qty: 30

## 2018-10-20 MED FILL — PROTONIX 40 MG IV SOLR: 40 mg | INTRAVENOUS | Qty: 40

## 2018-10-20 MED FILL — ONDANSETRON HCL 4 MG/2ML IJ SOLN: 4 MG/2ML | INTRAMUSCULAR | Qty: 2

## 2018-10-20 MED FILL — FAMOTIDINE 20 MG/2ML IV SOLN: 20 MG/2ML | INTRAVENOUS | Qty: 2

## 2018-10-20 NOTE — ED Notes (Signed)
Bed: B-09  Expected date:   Expected time:   Means of arrival:   Comments:  [place]       Mahalia Longest, RN  10/20/18 1526

## 2018-10-20 NOTE — ED Triage Notes (Signed)
Pt states she started with mid chest pain yesterday that worsened after she ate. Also states she has had a mild cough. Denies SOB.

## 2018-10-20 NOTE — ED Provider Notes (Signed)
[facility]  EMERGENCY DEPARTMENT ENCOUNTER      Pt Name: Deborah Riley  MRN: [id number]  Birthdate Jan 12, 1976  Date of evaluation: 10/20/2018  Provider: Benjaman Kindler, PA    Shared Visit or Autonomous Visit: Evaluation by APP.  My supervising physician was available for consultation.      CHIEF COMPLAINT       Chief Complaint   Patient presents with   ??? Chest Pain     onset 10/19/2018.  denies nausea vomiting.  weakness       HISTORY OF PRESENT ILLNESS  (Location/Symptom, Timing/Onset, Context/Setting, Quality, Duration, Modifying Factors, Severity.)   [name] is a 43 y.o. female who presents to the emergency department with complaints of midline chest pain, nausea and vomiting, reflux symptoms, upper abdominal pain.  Patient speaks some English, and her daughter at bedside acts as Nurse, learning disability.  She says the symptoms are consistent with symptoms she has had in the past, and she does not have any home medications right now.  Not specific about how long the symptoms have been going on, but says is been a while.  She reports some coughing, occasional nausea and vomiting.  Reports that she is somewhat lightheaded, not related to movement.  Denies shortness of breath.  Denies confusion, focal weakness, or numbness.  Denies diarrhea or any blood in the stool or vomit.  Denies any known COVID-19 exposure.    No other complaints.    Nursing Notes were reviewed and I agree.    REVIEW OF SYSTEMS    (2-9 systems for level 4, 10 or more for level 5)     Constitutional:  Negative for fever, chills, appetite change, fatigue and unexpected weight change.   HENT:  Negative for congestion, ear pain, facial swelling, rhinorrhea, sinus pressure, sneezing, sore throat and trouble swallowing.    Eyes:  Negative for photophobia, pain and visual disturbance.  Respiratory:  Negative for cough, shortness of breath, wheezing and stridor.  Cardiovascular: Positive for midline chest pain.  Negative for palpitations and leg  swelling.   Gastrointestinal: Positive for reflux symptoms, nausea and vomiting, upper abdominal pain.  Negative for diarrhea, constipation and blood in stool.   Genitourinary:  Negative for dysuria, urgency, hematuria, flank pain, vaginal bleeding, vaginal discharge and pelvic pain.   Musculoskeletal:  Negative for myalgias, arthralgias, neck pain and neck stiffness.   Neurological: Positive for lightheadedness.  Negative for dizziness, seizures, syncope, speech difficulty, weakness, numbness and headaches.   Psychiatric/Behavioral:  Negative for suicidal ideas, hallucinations, confusion, sleep disturbance and agitation.      Except as noted above the remainder of the review of systems was reviewed and negative.       PAST MEDICAL HISTORY         Diagnosis Date   ??? Depression    ??? H. pylori infection        SURGICAL HISTORY           Procedure Laterality Date   ??? OTHER SURGICAL HISTORY      none   ??? UPPER GASTROINTESTINAL ENDOSCOPY N/A 06/11/2018    EGD BIOPSY performed by Debria Garret, MD at [facility]       CURRENT MEDICATIONS       Previous Medications    OMEPRAZOLE (PRILOSEC) 20 MG DELAYED RELEASE CAPSULE    Take 1 capsule by mouth 2 times daily (before meals)    ONDANSETRON (ZOFRAN ODT) 8 MG TBDP DISINTEGRATING TABLET  Take 1 tablet by mouth every 8 hours as needed for Nausea    SUCRALFATE (CARAFATE) 1 GM/10ML SUSPENSION    Take 10 mLs by mouth 4 times daily    VITAMIN D (ERGOCALCIFEROL) 50000 UNITS CAPS CAPSULE    Take 1 capsule by mouth once a week       ALLERGIES     Patient has no known allergies.    FAMILY HISTORY           Problem Relation Age of Onset   ??? Heart Disease Father      Family Status   Relation Name Status   ??? Father  (Not Specified)        SOCIAL HISTORY      reports that she has quit smoking. She has never used smokeless tobacco. She reports that she does not drink alcohol or use drugs.    PHYSICAL EXAM    (up to 7 for level 4, 8 or more for level 5)     ED Triage Vitals  [10/20/18 1602]   BP Temp Temp Source Pulse Resp SpO2 Height Weight   112/74 98 ??F (36.7 ??C) Oral 69 20 96 % -- 148 lb 2.4 oz (67.2 kg)       Constitutional:  Appearing well-developed and well-nourished. No distress.   HENT:  Normocephalic and atraumatic. Conjunctivae and EOM are normal. Pupils are equal, round, and reactive to light.   Neck:  Normal range of motion. Neck supple. No tracheal deviation present. No thyromegaly present. No cervical adenopathy.  Cardiovascular:  Normal rate, regular rhythm, normal heart sounds and intact distal pulses.  Pulmonary/Chest:  Effort normal and breath sounds normal. No respiratory distress. No wheezes or rales.  Abdominal:  Soft. Bowel sounds are normal.  Mild tenderness to palpation across the upper abdomen, including the right upper quadrant.  Negative for lower abdominal tenderness.  Negative Murphy sign.  No distension, mass, rebound or guarding.   Musculoskeletal:  Normal range of motion. No edema exhibited.   Neurological:  Alert and oriented to person, place, and time. No cranial nerve deficit.   Skin:  Skin is warm and dry. Not diaphoretic.   Psychiatric:  Normal mood, affect, behavior, judgment and thought content.       DIAGNOSTIC RESULTS     RADIOLOGY:     Interpretation per the Radiologist below, if available at the time of this note:    XR CHEST PORTABLE   Final Result   Stable negative chest.         US ABDOMEN LIMITED   Preliminary Result   Unremarkable right upper quadrant ultrasound.      Nonvisualization of the pancreas due to overlying bowel gas.             LABS:  Labs Reviewed   CBC WITH AUTO DIFFERENTIAL    Narrative:     Performed at:  [facility]  [address]  [place], [place] [id number]   Phone [phone]   COMPREHENSIVE METABOLIC PANEL W/ REFLEX TO MG FOR LOW K    Narrative:     Performed at:  [facility]  [address]  [place], [place] [address]   Phone [phone]   LIPASE     Narrative:     Performed at:  [facility]  [address]  [place], [place] [id number]   Phone [phone]   TROPONIN  Narrative:     Performed at:  [facility]  [address] [place], [place] [address]   Phone [phone]   HCG, SERUM, QUALITATIVE    Narrative:     Performed at:  [facility]  [address] [place], [place] [address]   Phone [phone]   URINE RT REFLEX TO CULTURE       All other labs were within normal range or not returned as of this dictation.    EMERGENCY DEPARTMENT COURSE and DIFFERENTIAL DIAGNOSIS/MDM:   Vitals:    Vitals:    10/20/18 1602 10/20/18 1700 10/20/18 1715   BP: 112/74 110/71 103/65   Pulse: 69 72 64   Resp: 20 19 26    Temp: 98 ??F (36.7 ??C)     TempSrc: Oral     SpO2: 96% 98% 99%   Weight: 148 lb 2.4 oz (67.2 kg)         The patient's condition in the ED was good, the patient was afebrile and nontoxic in appearance, and the patient's physical exam was unremarkable other than for the abdominal tenderness noted above.  Right upper quadrant ultrasound showed no acute findings.  Portable chest x-ray showed no acute findings.  Basic lab work-up showed no notable abnormalities, including a normal white blood cell count and metabolic panel.  Lipase normal.  Troponin was negative and EKG showed no acute findings.  Patient does have a history of chronic abdominal pain and H. pylori infection.  Records show the patient did have a recent EGD procedure, though results are not accessible here now.  Patient for any acute intra-abdominal abnormality or cardiac event was very low.  Patient was given IV fluids and medications and reported some improvement in the ED.  She says she has no home medications at this time.  There was no indication for hospitalization or further workup.  She will be discharged with prescriptions for medications for symptom control, advised to follow-up with her  gastroenterologist as needed, and given an order for family practice follow-up care.  The patient verbalized understanding and agreement with this plan of care. The patient was advised to return to the emergency department if symptoms should significantly worsen or if new and concerning symptoms should appear.     I estimate there is LOW risk for ACUTE APPENDICITIS, BOWEL OBSTRUCTION, CHOLECYSTITIS, DIVERTICULITIS, INCARCERATED HERNIA, PANCREATITIS, PELVIC INFLAMMATORY DISEASE, OVARIAN TORSION, PERFORATED BOWEL,  BOWEL ISCHEMIA, CARDIAC ISCHEMIA, ECTOPIC PREGNANCY, TUBO-OVARIAN ABSCESS, PULMONARY EMBOLISM, ACUTE CORONARY SYNDROME, OR THORACIC AORTIC DISSECTION, thus I consider the discharge disposition reasonable.     PROCEDURES:  None    FINAL IMPRESSION      1. Abdominal pain, epigastric    2. History of Helicobacter pylori infection    3. Symptoms of gastroesophageal reflux          DISPOSITION/PLAN   DISPOSITION        PATIENT REFERRED TO:  [facility]  [phone]  Call   to get a family doctor      DISCHARGE MEDICATIONS:  New Prescriptions    OMEPRAZOLE (PRILOSEC) 20 MG DELAYED RELEASE CAPSULE    Take 1 capsule by mouth 2 times daily    ONDANSETRON (ZOFRAN ODT) 4 MG DISINTEGRATING TABLET    Take 1 tablet by mouth every 8 hours as needed for Nausea Let dissolve in mouth.    SUCRALFATE (CARAFATE) 1 GM TABLET    Take 1 tablet by  mouth 4 times daily as needed (chest/abdominal pain)       (Please note that portions of this note were completed with a voice recognition program.  Efforts were made to edit the dictations but occasionally words are mis-transcribed.)    Benjaman Kindler, PA           [place], [place]  10/20/18 1757

## 2018-10-21 LAB — EKG 12-LEAD
Atrial Rate: 71 {beats}/min
P Axis: 45 degrees
P-R Interval: 142 ms
Q-T Interval: 372 ms
QRS Duration: 74 ms
QTc Calculation (Bazett): 404 ms
R Axis: -3 degrees
T Axis: -3 degrees
Ventricular Rate: 71 {beats}/min

## 2018-10-29 ENCOUNTER — Ambulatory Visit: Admit: 2018-10-29 | Discharge: 2018-10-29 | Payer: MEDICAID | Attending: Family | Primary: Family

## 2018-10-29 DIAGNOSIS — Z20822 Contact with and (suspected) exposure to covid-19: Secondary | ICD-10-CM

## 2018-10-29 NOTE — Progress Notes (Addendum)
10/29/2018  Deborah Riley (DOB:  04/24/1976)    PCP_______________________    EMPLOYER / Occupation: [employer]            DO YOU NEED A WORK NOTE: []  Yes  []  No   Pharmacy:_________________    FLU/COVID-19 CLINIC EVALUATION    []  ASYMPTOMATIC  []  RETEST  []  EXPOSURE TO KNOWN POSITIVE  HPI:  SYMPTOMS:  Primary Language: []  English   []  Spanish  []  Nepali  []  Other___________________  Allergies:_________    SOCIAL HISTORY:  Number of people living in patient's home (counting the patient as 1):  []  1   []  2   []  3   []  4   []  5   []  >6    Symptom duration, days:  []  1   []  2   []  3   [x]  4 - 7   []  8 - 10   []  11 - 13   []  >14    []  Fevers    []  Symptom (not measured)  []  Measured (Result:  degrees)  []  Chills  [x]  Cough []  Dry [x]  Productive   [] Loss of Taste  []  Loss of Smell  [] Decreased Appetite  []  Coughing up blood  }  []  Chest Congestion  []  Nasal Congestion  []  Runny  Nose  []  Sneezing  []  Feeling short of breath   [] Sometimes    []  Frequently    []  All the time     [x]  Chest pain     [x]  Headaches  [] Tolerable  []  Severe     []  Fatigue  []  Sore throat  []  Muscle aches  []  Nausea  []  Vomiting  [] Unable to keep fluids down     []  Diarrhea  [] Severe       []  OTHER SYMPTOMS:  Dizziness     Symptom course:   []  Worsening     []  Stable     []  Improving    RISK FACTORS:1}  []  Pregnant or possibly pregnant  []  Age over 2360  []  Diabetes  []  HTN  []  Heart disease  []  Asthma  []  COPD/Other chronic lung diseases  []  Active Cancer  []  On Chemotherapy  []  Taking oral steroids  []  History Lymphoma/Leukemia  []  Autoimmune Disorder  []  Close contact with a lab confirmed COVID-19 patient within 14 days of symptom onset  []  History of travel from affected geographical areas within 14 days of symptom onset     PHYSICAL EXAMINATION:  Vitals:    10/29/18 1611   Pulse: 66   Temp: 98.1 ??F (36.7 ??C)   SpO2: 98%        INSTRUCTIONS:  "[x] " Indicates a positive item  "[] " Indicates a negative item    DELETE ALL ITEMS NOT EXAMINED    [x]  Alert  [x]   Oriented to person/place/time    []  No apparent distress  []  Toxic appearing    []  Face flushed appearing []  Sclera clear  []  Lips are cyanotic      [x]  Breathing appears normal  []  Appears tachypneic  [x]  Speaks in complete sentences    []  Rash on visible skin    []  Cranial Nerves II-XII grossly intact    [x]  Motor grossly intact in visible upper extremities    []  Motor grossly intact in visible lower extremities    [x]  Normal Mood  []  Anxious appearing    []  Depressed appearing  []  Confused appearing      []  Poor short  term memory  []  Poor long term memory    []  OTHER:  1}    TESTS ORDERED:    []  POCT FLU  []  POCT STREP  [x]  COVID-19 Test sent    TEST RESULTS:    POCT FLU test:  []  Positive  []  Negative  POCT STREP test:  []  Positive  []  Negative    ASSESSMENT:    []  Flu  []  Strep Throat  []  Uncertain Viral Respiratory Infection  [x]  Possible COVID-19    PLAN:    [x]  Discharge home with written instructions for:  []  Flu management  []  Strep throat management  []  Viral respiratory illness management  [x]  Possible COVID-19 infection with self-quarantine and management of symptoms  [x]  Follow-up with primary care physician or emergency department if worsens  []  Referred to emergency department for evaluation  Note per Elizebeth Brooking, LPN and Scribe with corrections and edits per Murray Hodgkins, CNP  I agree with entirety of note and was present and performed history and physical.  I also confirm that the note above accurately reflects all work, treatment, procedures, and medical decision making performed by me, Murray Hodgkins, CNP      An  electronic signature was used to authenticate this note.     --Elizebeth Brooking, LPN on 5/0/3888 at 4:12 PM

## 2018-10-29 NOTE — Patient Instructions (Signed)
???Steps to help prevent the spread of COVID-19 if you are sick  SOURCE - https://www.cdc.gov/coronavirus/2019-ncov/about/steps-when-sick.html     Stay home except to get medical care   ; Stay home: People who are mildly ill with COVID-19 are able to isolate at home during their illness. You should restrict activities outside your home, except for getting medical care.   ; Avoid public areas: Do not go to work, school, or public areas.   ; Avoid public transportation: Avoid using public transportation, ride-sharing, or taxis.  ; Separate yourself from other people and animals in your home   ; Stay away from others: As much as possible, you should stay in a specific room and away from other people in your home. Also, you should use a separate bathroom, if available.   ; Limit contact with pets & animals: You should restrict contact with pets and other animals while you are sick with COVID-19, just like you would around other people. Although there have not been reports of pets or other animals becoming sick with COVID-19, it is still recommended that people sick with COVID-19 limit contact with animals until more information is known about the virus.   ; When possible, have another member of your household care for your animals while you are sick. If you are sick with COVID-19, avoid contact with your pet, including petting, snuggling, being kissed or licked, and sharing food. If you must care for your pet or be around animals while you are sick, wash your hands before and after you interact with pets and wear a facemask. See COVID-19 and Animals for more information.       Other considerations  ??? The ill person should eat/be fed in their room if possible. Non-disposable food service items used should be handled with gloves and washed with hot water or in a dishwasher. Clean hands after handling used food service items.  ??? If possible, dedicate a lined trash can for the ill person. Use gloves when removing garbage  bags, handling, and disposing of trash. Wash hands after handling or disposing of trash.  ??? Consider consulting with your local health department about trash disposal guidance if available.    Information for Household Members and Caregivers of Someone who is Sick   Call ahead before visiting your doctor   Call ahead: If you have a medical appointment, call the healthcare provider and tell them that you have or may have COVID-19. This will help the healthcare provider's office take steps to keep other people from getting infected or exposed.  Wear a facemask if you are sick   ; If you are sick: You should wear a facemask when you are around other people (e.g., sharing a room or vehicle) or pets and before you enter a healthcare provider's office.   ; If you are caring for others: If the person who is sick is not able to wear a facemask (for example, because it causes trouble breathing), then people who live with the person who is sick should not stay in the same room with them, or they should wear a facemask if they enter a room with the person who is sick.  Cover your coughs and sneezes   ; Cover: Cover your mouth and nose with a tissue when you cough or sneeze.   ; Dispose: Throw used tissues in a lined trash can.   ; Wash hands: Immediately wash your hands with soap and water for at least 20 seconds or, if   soap and water are not available, clean your hands with an alcohol-based hand sanitizer that contains at least 60% alcohol.  Clean your hands often   ; Wash hands: Wash your hands often with soap and water for at least 20 seconds, especially after blowing your nose, coughing, or sneezing; going to the bathroom; and before eating or preparing food.   ; Hand sanitizer: If soap and water are not readily available, use an alcohol-based hand sanitizer with at least 60% alcohol, covering all surfaces of your hands and rubbing them together until they feel dry.   ; Soap and water: Soap and water are the best option if  hands are visibly dirty.   ; Avoid touching: Avoid touching your eyes, nose, and mouth with unwashed hands.  Handwashing Tips   ; Wet your hands with clean, running water (warm or cold), turn off the tap, and apply soap.  ; Lather your hands by rubbing them together with the soap. Lather the backs of your hands, between your fingers, and under your nails.  ; Scrub your hands for at least 20 seconds. Need a timer? Hum the ???Happy Birthday??? song from beginning to end twice.  ; Rinse your hands well under clean, running water.  ; Dry your hands using a clean towel or air dry them.  Avoid sharing personal household items   ; Do not share: You should not share dishes, drinking glasses, cups, eating utensils, towels, or bedding with other people or pets in your home.   ; Wash thoroughly after use: After using these items, they should be washed thoroughly with soap and water.  Clean all ???high-touch??? surfaces everyday   ; Clean and disinfect: Practice routine cleaning of high touch surfaces.  ; High touch surfaces include counters, tabletops, doorknobs, bathroom fixtures, toilets, phones, keyboards, tablets, and bedside tables.  ; Disinfect areas with bodily fluids: Also, clean any surfaces that may have blood, stool, or body fluids on them.   ; Household cleaners: Use a household cleaning spray or wipe, according to the label instructions. Labels contain instructions for safe and effective use of the cleaning product including precautions you should take when applying the product, such as wearing gloves and making sure you have good ventilation during use of the product.    Monitor your symptoms   Seek medical attention: Seek prompt medical attention if your illness is worsening     (e.g., difficulty breathing).   ; Call your doctor: Before seeking care, call your healthcare provider and tell them that you have, or are being evaluated for, COVID-19.   ; Wear a facemask when sick: Put on a facemask before you enter the  facility. These steps will help the healthcare provider's office to keep other people in the office or waiting room from getting infected or exposed.   ; Alert health department: Ask your healthcare provider to call the local or state health department. Persons who are placed under active monitoring or facilitated self-monitoring should follow instructions provided by their local health department or occupational health professionals, as appropriate.  ; Call 911 if you have a medical emergency: If you have a medical emergency and need to call 911, notify the dispatch personnel that you have, or are being evaluated for COVID-19. If possible, put on a facemask before emergency medical services arrive.

## 2018-11-01 LAB — COVID-19 AMBULATORY: SARS-CoV-2: NOT DETECTED

## 2018-11-02 NOTE — Other (Signed)
Please contact patient with their testing results:    Your test for COVID-19, also known as novel coronavirus, came back negative. No virus was detected from the sample collected.     Until your symptoms are fully resolved, you may still be contagious.     We recommend that you remain isolated for 7 days minimum or 72 hours after your symptoms have completely resolved, whichever is longer.     Continually monitor symptoms. Contact a medical provider if symptoms are worsening.     If you have any additional questions, contact your PCP.    For additional information, please visit the Centers for Disease Control and Prevention   https://www.cdc.gov/coronavirus/2019-ncov/index.html

## 2019-02-09 ENCOUNTER — Emergency Department: Admit: 2019-02-09 | Payer: MEDICAID | Primary: Family

## 2019-02-09 ENCOUNTER — Inpatient Hospital Stay: Admit: 2019-02-09 | Discharge: 2019-02-09 | Disposition: A | Discharge status: Home / Self Care | Payer: MEDICAID

## 2019-02-09 DIAGNOSIS — R1013 Epigastric pain: Secondary | ICD-10-CM

## 2019-02-09 LAB — CBC WITH AUTO DIFFERENTIAL
Basophils %: 0.5 %
Basophils Absolute: 0 10*3/uL (ref 0.0–0.2)
Eosinophils %: 2 %
Eosinophils Absolute: 0.1 10*3/uL (ref 0.0–0.6)
Hematocrit: 41.9 % (ref 36.0–48.0)
Hemoglobin: 13.7 g/dL (ref 12.0–16.0)
Lymphocytes %: 28.5 %
Lymphocytes Absolute: 2.1 10*3/uL (ref 1.0–5.1)
MCH: 29.1 pg (ref 26.0–34.0)
MCHC: 32.7 g/dL (ref 31.0–36.0)
MCV: 89 fL (ref 80.0–100.0)
MPV: 11.1 fL — ABNORMAL HIGH (ref 5.0–10.5)
Monocytes %: 7.1 %
Monocytes Absolute: 0.5 10*3/uL (ref 0.0–1.3)
Neutrophils %: 61.9 %
Neutrophils Absolute: 4.6 10*3/uL (ref 1.7–7.7)
Platelets: 200 10*3/uL (ref 135–450)
RBC: 4.71 M/uL (ref 4.00–5.20)
RDW: 13 % (ref 12.4–15.4)
WBC: 7.5 10*3/uL (ref 4.0–11.0)

## 2019-02-09 LAB — BASIC METABOLIC PANEL W/ REFLEX TO MG FOR LOW K
Anion Gap: 12 (ref 3–16)
BUN: 10 mg/dL (ref 7–20)
CO2: 23 mmol/L (ref 21–32)
Calcium: 9.4 mg/dL (ref 8.3–10.6)
Chloride: 102 mmol/L (ref 99–110)
Creatinine: 0.6 mg/dL (ref 0.6–1.1)
GFR African American: >60 (ref >60)
GFR Non-African American: >60 (ref >60)
Glucose: 142 mg/dL — ABNORMAL HIGH (ref 70–99)
Potassium reflex Magnesium: 3.8 mmol/L (ref 3.5–5.1)
Sodium: 137 mmol/L (ref 136–145)

## 2019-02-09 LAB — HEPATIC FUNCTION PANEL
ALT: 12 U/L (ref 10–40)
AST: 21 U/L (ref 15–37)
Albumin: 4.1 g/dL (ref 3.4–5.0)
Alkaline Phosphatase: 74 U/L (ref 40–129)
Bilirubin, Direct: <0.2 mg/dL (ref 0.0–0.3)
Total Bilirubin: 0.6 mg/dL (ref 0.0–1.0)
Total Protein: 8.2 g/dL (ref 6.4–8.2)

## 2019-02-09 LAB — EKG 12-LEAD
Atrial Rate: 64 {beats}/min
P Axis: 41 degrees
P-R Interval: 148 ms
Q-T Interval: 404 ms
QRS Duration: 82 ms
QTc Calculation (Bazett): 416 ms
R Axis: 1 degrees
T Axis: -4 degrees
Ventricular Rate: 64 {beats}/min

## 2019-02-09 LAB — D-DIMER, QUANTITATIVE: D-Dimer, Quant: 273 ng/mL DDU — ABNORMAL HIGH (ref 0–229)

## 2019-02-09 LAB — HCG, SERUM, QUALITATIVE: hCG Qual: NEGATIVE

## 2019-02-09 LAB — TROPONIN: Troponin: <0.01 ng/mL (ref <0.01)

## 2019-02-09 LAB — LIPASE: Lipase: 29 U/L (ref 13.0–60.0)

## 2019-02-09 MED ORDER — ONDANSETRON HCL 4 MG/2ML IJ SOLN
4 MG/2ML | Freq: Once | INTRAMUSCULAR | Status: AC
Start: 2019-02-09 — End: 2019-02-09
  Administered 2019-02-09: 21:00:00 4 mg via INTRAVENOUS

## 2019-02-09 MED ORDER — FAMOTIDINE 20 MG/2ML IV SOLN
20 MG/2ML | Freq: Once | INTRAVENOUS | Status: AC
Start: 2019-02-09 — End: 2019-02-09
  Administered 2019-02-09: 21:00:00 20 mg via INTRAVENOUS

## 2019-02-09 MED ORDER — ONDANSETRON 4 MG PO TBDP
4 MG | ORAL_TABLET | Freq: Three times a day (TID) | ORAL | 0 refills | Status: DC | PRN
Start: 2019-02-09 — End: 2019-03-17

## 2019-02-09 MED ORDER — IOPAMIDOL 76 % IV SOLN
76 % | Freq: Once | INTRAVENOUS | Status: AC | PRN
Start: 2019-02-09 — End: 2019-02-09
  Administered 2019-02-09: 20:00:00 75 mL via INTRAVENOUS

## 2019-02-09 MED ORDER — FAMOTIDINE 20 MG PO TABS
20 MG | ORAL_TABLET | Freq: Two times a day (BID) | ORAL | 0 refills | Status: DC
Start: 2019-02-09 — End: 2019-06-13

## 2019-02-09 MED ORDER — SODIUM CHLORIDE 0.9 % IV BOLUS
0.9 % | Freq: Once | INTRAVENOUS | Status: AC
Start: 2019-02-09 — End: 2019-02-09
  Administered 2019-02-09: 21:00:00 1000 mL via INTRAVENOUS

## 2019-02-09 MED FILL — ONDANSETRON HCL 4 MG/2ML IJ SOLN: 4 MG/2ML | INTRAMUSCULAR | Qty: 2

## 2019-02-09 MED FILL — FAMOTIDINE 20 MG/2ML IV SOLN: 20 MG/2ML | INTRAVENOUS | Qty: 2

## 2019-02-09 NOTE — ED Provider Notes (Signed)
[facility] EMERGENCY DEPARTMENT  EMERGENCY DEPARTMENT ENCOUNTER      Pt Name: Deborah Riley  MRN: [id number]  Birthdate Sep 05, 1975  Date of evaluation: 02/09/2019  Provider: Irven , PA    This patient was not seen and evaluated by the attending physician No att. providers found.    CHIEF COMPLAINT       Chief Complaint   Patient presents with   . Chest Pain     x 2 days   . Cough     "with small amount of blood"    . Shortness of Breath       CRITICAL CARE TIME   I performed a total Critical Care time of  15 minutes, excluding separately reportable procedures.  There was a high probability of clinically significant/life threatening deterioration in the patient's condition which required my urgent intervention. Not limited to multiple reexaminations, discussions with attending physician and consultants.      HISTORY OF PRESENT ILLNESS  (Location/Symptom, Timing/Onset, Context/Setting, Quality, Duration, Modifying Factors, Severity.)   Deborah Riley is a 43 y.o. female who presents to the emergency department complaining of epigastric pain and chest pain.  Is been going on for 2 days intermittently.  I spoke through [place] translator [phone].  She said some nausea no vomiting.  Denies history of TB or recent travel.  She states the cough has been productive with some blood-tinged phlegm.  She states she is had similar symptoms in the past and has been evaluated in the ER.  Did not take anything at home for the symptoms.  History of GERD and H. pylori.  Former smoker.  Last endoscopy less than a year ago.  Denies alcohol use.  Denies bloody or dark tarry stools.  No prior abdominal surgeries.    Nursing Notes were reviewed and I agree.    REVIEW OF SYSTEMS    (2-9 systems for level 4, 10 or more for level 5)     Review of Systems   Constitutional: Negative for fever.   Respiratory: Positive for cough. Negative for shortness of breath.    Cardiovascular: Positive for chest pain. Negative for leg  swelling.   Gastrointestinal: Positive for abdominal pain and nausea. Negative for diarrhea and vomiting.   Musculoskeletal: Negative for back pain, neck pain and neck stiffness.   Skin: Negative for color change, rash and wound.   Neurological: Negative for weakness and numbness.   Psychiatric/Behavioral: Negative for agitation, behavioral problems and confusion.       Except as noted above the remainder of the review of systems was reviewed and negative.       PAST MEDICAL HISTORY         Diagnosis Date   . Depression    . H. pylori infection        SURGICAL HISTORY           Procedure Laterality Date   . OTHER SURGICAL HISTORY      none   . UPPER GASTROINTESTINAL ENDOSCOPY N/A 06/11/2018    EGD BIOPSY performed by Debria Garret, MD at [facility] ENDOSCOPY       CURRENT MEDICATIONS       Discharge Medication List as of 02/09/2019  5:30 PM      CONTINUE these medications which have NOT CHANGED    Details   !! omeprazole (PRILOSEC) 20 MG delayed release capsule Take 1 capsule by mouth 2 times daily, Disp-60 capsule, R-0Print  sucralfate (CARAFATE) 1 GM tablet Take 1 tablet by mouth 4 times daily as needed (chest/abdominal pain), Disp-30 tablet, R-0Print      !! omeprazole (PRILOSEC) 20 MG delayed release capsule Take 1 capsule by mouth 2 times daily (before meals), Disp-60 capsule, R-0Print      sucralfate (CARAFATE) 1 GM/10ML suspension Take 10 mLs by mouth 4 times daily, Disp-1200 mL, R-0Print      vitamin D (ERGOCALCIFEROL) 50000 units CAPS capsule Take 1 capsule by mouth once a week, Disp-12 capsule, R-0Normal       !! - Potential duplicate medications found. Please discuss with provider.          ALLERGIES     Patient has no known allergies.    FAMILY HISTORY           Problem Relation Age of Onset   . Heart Disease Father      Family Status   Relation Name Status   . Father  (Not Specified)        SOCIAL HISTORY      reports that she has quit smoking. She has never used smokeless tobacco. She reports that  she does not drink alcohol or use drugs.    PHYSICAL EXAM    (up to 7 for level 4, 8 or more for level 5)     ED Triage Vitals [02/09/19 1425]   BP Temp Temp src Pulse Resp SpO2 Height Weight   -- -- -- -- -- -- -- 147 lb 7.8 oz (66.9 kg)       Physical Exam  Vitals signs and nursing note reviewed.   Constitutional:       Appearance: She is well-developed.   HENT:      Head: Normocephalic.   Cardiovascular:      Rate and Rhythm: Normal rate.   Pulmonary:      Effort: Pulmonary effort is normal.   Abdominal:      Tenderness: There is abdominal tenderness in the epigastric area. There is no guarding or rebound.   Musculoskeletal: Normal range of motion.   Skin:     General: Skin is warm.   Neurological:      Mental Status: She is alert.   Psychiatric:         Mood and Affect: Mood normal.         Behavior: Behavior normal.         DIAGNOSTIC RESULTS     EKG: All EKG's are interpreted by Irven [place], PA in the absence of a cardiologist.    EKG interpreted by myself - please refer to attending physician's note for complete EKG interpretation:    Rhythm: sinus rhythm   Rate: 64  No evidence of acute ischemia or injury.    RADIOLOGY:   Non-plain film images such as CT, Ultrasound and MRI are read by the radiologist. Plain radiographic images are visualized and preliminarily interpreted by Irven [place], PA with the below findings:    Reviewed radiologist's interpretation.     Interpretation per the Radiologist below, if available at the time of this note:    CT CHEST PULMONARY EMBOLISM W CONTRAST   Final Result   No evidence of pulmonary embolism on slightly limited exam due to technical   factors.      Minimal bibasilar dependent atelectasis.         XR CHEST (2 VW)   Final Result   Bibasilar hypoaeration.  Otherwise, no acute abnormalities seen in  the chest               LABS:  Labs Reviewed   CBC WITH AUTO DIFFERENTIAL - Abnormal; Notable for the following components:       Result Value    MPV 11.1 (*)     All other  components within normal limits    Narrative:     Performed at:  [facility]  [address] [place], [place] [address]   Phone [phone]   BASIC METABOLIC PANEL W/ REFLEX TO MG FOR LOW K - Abnormal; Notable for the following components:    Glucose 142 (*)     All other components within normal limits    Narrative:     Performed at:  [facility]  [address] [place], [place] [address]   Phone [phone]   D-DIMER, QUANTITATIVE - Abnormal; Notable for the following components:    D-Dimer, Quant 273 (*)     All other components within normal limits    Narrative:     Performed at:  [facility]  [address] [place], [place] [address]   Phone [phone]   HCG, SERUM, QUALITATIVE    Narrative:     Performed at:  [facility]  [address] [place], [place] [address]   Phone [phone]   TROPONIN    Narrative:     Performed at:  [facility]  [address] [place], [place] [address]   Phone [phone]   HEPATIC FUNCTION PANEL    Narrative:     Performed at:  [facility]  [address] [place], [place] [address]   Phone [phone]   LIPASE    Narrative:     Performed at:  [facility]  [address] [place], [place] [address]   Phone [phone]       All other labs were within normal range or not returned as of this dictation.    EMERGENCY DEPARTMENT COURSE and DIFFERENTIAL DIAGNOSIS/MDM:   Vitals:    Vitals:    02/09/19 1652 02/09/19 1700 02/09/19 1715 02/09/19 1800   BP:  115/73 113/68 114/80   Pulse:  52 52 94   Resp:  23 23 19    Temp:    98.4 F (36.9 C)   TempSrc:    Oral   SpO2:    100%   Weight: 145 lb 4.5 oz (65.9 kg)        I discussed with Gal [place] and/or family the exam results, diagnosis, care, prognosis, reasons to return and the importance of  follow up. Patient and/or family is in full agreement with plan and all questions have been answered.  Specific discharge instructions explained, including reasons to return to the emergency department.Heidemarie Teresi is well appearing, non-toxic, and afebrile at the time of discharge.     Patient has some epigastric pain and some right-sided chest pain with some hemoptysis.  No recent travel no weight loss or history of TB or exposure.  She did have a slightly elevated d-dimer but CT chest shows no evidence of PE.  She is feeling better after medications and we discharged on antacids nausea medication instructed to follow-up with primary care and return for new, worsening or other concerns.  I estimate there is LOW risk for PULMONARY EMBOLISM, ACUTE CORONARY SYNDROME, OR THORACIC AORTIC DISSECTION, thus I consider the discharge disposition reasonable.     CONSULTS:  None    PROCEDURES:  Procedures      FINAL IMPRESSION      1. Chest pain, unspecified type    2. Abdominal pain, epigastric          DISPOSITION/PLAN   DISPOSITION        PATIENT REFERRED TO:  [facility]  [phone]          DISCHARGE MEDICATIONS:  Discharge Medication List as of 02/09/2019  5:30 PM      START taking these medications    Details   famotidine (PEPCID) 20 MG tablet Take 1 tablet by mouth 2 times daily, Disp-20 tablet,R-0Print             (Please note that portions of this note were completed with a voice recognition program.  Efforts were made to edit the dictations but occasionally words are mis-transcribed.)    Irven [place], PA       Irven [place], [place]  02/09/19 1945

## 2019-02-09 NOTE — ED Triage Notes (Addendum)
Patient arrived to ED via private vehicle. Translation line used [id number] to speak with the patient. Patient reports CP x 2 days and coughing up small amount of blood.

## 2019-02-10 NOTE — Care Coordination-Inpatient (Signed)
Unsuccessful attempt to follow up ED visit and review COVID19 precautions.  Message left by [place] interpreter on vm to return call to [phone].

## 2019-02-11 NOTE — Care Coordination-Inpatient (Signed)
Unsuccessful attempt to follow up ED visit and review COVID19 precautions.  Phone not currently working "subscribed you have dialed is not in service".

## 2019-02-11 NOTE — Telephone Encounter (Signed)
Reason for Disposition  ??? Caller has already spoken with another triager and has no further questions    Protocols used: NO CONTACT OR DUPLICATE CONTACT CALL-ADULT-[place]  Received call from [facility].      Pt calling with ongoing chest pain for 4 years. Was just discharged from hospital yesterday. Needing to make follow up appt. No worsening or new symptoms. Did not triage. Advised pt to call if worsening symptoms.    Call soft transferred to DJ in [facility] to schedule appointment.    Please do not reply to the triage nurse through this encounter.  Any subsequent communication should be directly with the patient.

## 2019-03-17 ENCOUNTER — Ambulatory Visit: Admit: 2019-03-17 | Discharge: 2019-03-17 | Payer: MEDICAID | Attending: Family | Primary: Family

## 2019-03-17 DIAGNOSIS — K219 Gastro-esophageal reflux disease without esophagitis: Secondary | ICD-10-CM

## 2019-03-17 MED ORDER — OMEPRAZOLE 20 MG PO CPDR
20 MG | ORAL_CAPSULE | Freq: Two times a day (BID) | ORAL | 5 refills | Status: DC
Start: 2019-03-17 — End: 2019-08-22

## 2019-03-17 MED ORDER — ONDANSETRON 4 MG PO TBDP
4 MG | ORAL_TABLET | Freq: Three times a day (TID) | ORAL | 0 refills | Status: DC | PRN
Start: 2019-03-17 — End: 2019-06-13

## 2019-03-17 NOTE — Progress Notes (Signed)
03/17/2019     Deborah Riley (DOB:  [birth year]) is a 43 y.o. female, here for evaluation of the following medical concerns:    HPI  Deborah Riley presents today with follow-up of chest/abdominal pain. Nepali interpreter line was used to perform HPI and evaluation. She was in the ER last month for chest pain. She states she has had worsening breath, abdominal pain, and cough up phlegm. Chest xray was negative, and she was discharged on pepcid. She states that the medication didn't really help. She states that her stool looks different than previous. She also states some nausea. She has a history of GERD. States she ran out of medication that she normally takes, but even this does not always help. Denies any fevers or bloody stool. Denies vomiting.    Review of Systems   Constitutional: Negative for chills and fever.   HENT: Negative for ear discharge, ear pain, hearing loss, sinus pressure, sinus pain and sore throat.    Eyes: Negative for pain, discharge and redness.   Respiratory: Positive for cough. Negative for shortness of breath and wheezing.    Cardiovascular: Negative for chest pain and palpitations.   Gastrointestinal: Positive for abdominal distention, abdominal pain and nausea. Negative for diarrhea and vomiting.   Genitourinary: Negative for dysuria and hematuria.   Musculoskeletal: Negative for arthralgias and myalgias.   Skin: Negative for rash.   Neurological: Negative for dizziness, weakness, light-headedness, numbness and headaches.       Prior to Visit Medications    Medication Sig Taking? Authorizing Provider   omeprazole (PRILOSEC) 20 MG delayed release capsule Take 1 capsule by mouth 2 times daily Yes Youlanda Mighty, APRN - CNP   ondansetron (ZOFRAN ODT) 4 MG disintegrating tablet Take 1 tablet by mouth every 8 hours as needed for Nausea or Vomiting Let dissolve in mouth. Yes Youlanda Mighty, APRN - CNP   famotidine (PEPCID) 20 MG tablet Take 1 tablet by mouth 2 times daily  Cameron Ali, PA    sucralfate (CARAFATE) 1 GM tablet Take 1 tablet by mouth 4 times daily as needed (chest/abdominal pain)  Roselee Culver, PA   sucralfate (CARAFATE) 1 GM/10ML suspension Take 10 mLs by mouth 4 times daily  Vickki Hearing, PA-C   vitamin D (ERGOCALCIFEROL) 50000 units CAPS capsule Take 1 capsule by mouth once a week  Randall Hiss, APRN - CNP        Social History     Tobacco Use   ??? Smoking status: Former Smoker   ??? Smokeless tobacco: Never Used   Substance Use Topics   ??? Alcohol use: No     Frequency: Never        Vitals:    03/17/19 1032   BP: 118/74   Site: Left Upper Arm   Position: Sitting   Cuff Size: Large Adult   Pulse: 55   Temp: 97.8 ??F (36.6 ??C)   TempSrc: Tympanic   SpO2: 98%   Weight: 148 lb 3.2 oz (67.2 kg)  Comment: shoes on   Height: 5' (1.524 m)     Estimated body mass index is 28.94 kg/m?? as calculated from the following:    Height as of this encounter: 5' (1.524 m).    Weight as of this encounter: 148 lb 3.2 oz (67.2 kg).    Physical Exam  Constitutional:       Appearance: Normal appearance. She is normal weight.   HENT:      Head: Normocephalic and atraumatic.  Right Ear: Tympanic membrane, ear canal and external ear normal.      Left Ear: Tympanic membrane, ear canal and external ear normal.      Mouth/Throat:      Mouth: Mucous membranes are moist.   Eyes:      Extraocular Movements: Extraocular movements intact.      Conjunctiva/sclera: Conjunctivae normal.      Pupils: Pupils are equal, round, and reactive to light.   Neck:      Musculoskeletal: Normal range of motion and neck supple.      Thyroid: No thyromegaly.   Cardiovascular:      Rate and Rhythm: Normal rate and regular rhythm.      Pulses: Normal pulses.      Heart sounds: Normal heart sounds. No murmur.   Pulmonary:      Effort: Pulmonary effort is normal.      Breath sounds: Normal breath sounds.   Abdominal:      General: Bowel sounds are normal. There is distension.      Palpations: Abdomen is soft.      Tenderness: There is  abdominal tenderness. There is no guarding or rebound.      Comments: Right lower quadrant tenderness   Musculoskeletal: Normal range of motion.   Lymphadenopathy:      Cervical: No cervical adenopathy.   Skin:     General: Skin is warm and dry.      Capillary Refill: Capillary refill takes less than 2 seconds.   Neurological:      Mental Status: She is alert and oriented to person, place, and time.   Psychiatric:         Mood and Affect: Mood normal.         Behavior: Behavior normal.         ASSESSMENT/PLAN:  1. Gastroesophageal reflux disease, unspecified whether esophagitis present  - Given multiple ER visits for abdominal pain and lack of resolution of symptoms on current regimen, would benefit from GI referral.  - Refill medications to attempt to provide some relief at this time  - [facility] - Nestor Ramp MD, Gastroenterology, [place]  - omeprazole (PRILOSEC) 20 MG delayed release capsule; Take 1 capsule by mouth 2 times daily  Dispense: 60 capsule; Refill: 5  - ondansetron (ZOFRAN ODT) 4 MG disintegrating tablet; Take 1 tablet by mouth every 8 hours as needed for Nausea or Vomiting Let dissolve in mouth.  Dispense: 30 tablet; Refill: 0    2. Generalized abdominal pain  - GI as above  - [facility] - Nestor Ramp MD, Gastroenterology, [place]      Return in about 9 months (around 12/15/2019) for Annual Physical.    An electronic signature was used to authenticate this note.    --Mickel Crow, APRN - CNP on 03/17/2019 at 11:36 AM

## 2019-06-13 ENCOUNTER — Inpatient Hospital Stay: Admit: 2019-06-13 | Discharge: 2019-06-14 | Disposition: A | Discharge status: Home / Self Care | Payer: MEDICAID

## 2019-06-13 ENCOUNTER — Emergency Department: Admit: 2019-06-13 | Payer: MEDICAID | Primary: Family

## 2019-06-13 DIAGNOSIS — J029 Acute pharyngitis, unspecified: Secondary | ICD-10-CM

## 2019-06-13 LAB — CBC WITH AUTO DIFFERENTIAL
Basophils %: 0.8 %
Basophils Absolute: 0.1 10*3/uL (ref 0.0–0.2)
Eosinophils %: 1.8 %
Eosinophils Absolute: 0.1 10*3/uL (ref 0.0–0.6)
Hematocrit: 40.6 % (ref 36.0–48.0)
Hemoglobin: 13.3 g/dL (ref 12.0–16.0)
Lymphocytes %: 24.7 %
MCH: 29.1 pg (ref 26.0–34.0)
MCHC: 32.6 g/dL (ref 31.0–36.0)
MCV: 89.1 fL (ref 80.0–100.0)
MPV: 10.4 fL (ref 5.0–10.5)
Monocytes %: 10.3 %
Monocytes Absolute: 0.8 10*3/uL (ref 0.0–1.3)
Neutrophils %: 62.4 %
Neutrophils Absolute: 5 10*3/uL (ref 1.7–7.7)
Platelets: 207 10*3/uL (ref 135–450)
RBC: 4.56 M/uL (ref 4.00–5.20)
RDW: 13.2 % (ref 12.4–15.4)
WBC: 8 10*3/uL (ref 4.0–11.0)

## 2019-06-13 LAB — COMPREHENSIVE METABOLIC PANEL
ALT: 12 U/L (ref 10–40)
AST: 28 U/L (ref 15–37)
Albumin/Globulin Ratio: 0.9 — ABNORMAL LOW (ref 1.1–2.2)
Albumin: 3.7 g/dL (ref 3.4–5.0)
Alkaline Phosphatase: 73 U/L (ref 40–129)
Anion Gap: 7 (ref 3–16)
BUN: 13 mg/dL (ref 7–20)
CO2: 27 mmol/L (ref 21–32)
Calcium: 8.9 mg/dL (ref 8.3–10.6)
Chloride: 101 mmol/L (ref 99–110)
Creatinine: 0.5 mg/dL — ABNORMAL LOW (ref 0.6–1.1)
GFR African American: >60 (ref >60)
GFR Non-African American: >60 (ref >60)
Globulin: 4.1 g/dL
Glucose: 100 mg/dL — ABNORMAL HIGH (ref 70–99)
Potassium: 5.4 mmol/L — ABNORMAL HIGH (ref 3.5–5.1)
Sodium: 135 mmol/L — ABNORMAL LOW (ref 136–145)
Total Bilirubin: 0.5 mg/dL (ref 0.0–1.0)
Total Protein: 7.8 g/dL (ref 6.4–8.2)

## 2019-06-13 LAB — STREP SCREEN GROUP A THROAT: Rapid Strep A Screen: NEGATIVE

## 2019-06-13 LAB — BRAIN NATRIURETIC PEPTIDE: Pro-BNP: <5 pg/mL (ref 0–124)

## 2019-06-13 LAB — TROPONIN: Troponin: <0.01 ng/mL (ref <0.01)

## 2019-06-13 NOTE — ED Triage Notes (Signed)
Off and on nose bleed since yesterday, sore throat, and some mid sternal non radiating cp.denies sob or cough, denies fevers

## 2019-06-13 NOTE — ED Provider Notes (Signed)
[facility] EMERGENCY DEPARTMENT  EMERGENCY DEPARTMENT ENCOUNTER      Pt Name: Deborah Riley  MRN: [id number]  Birthdate 09/30/1975  Date of evaluation: 06/13/2019  Provider: Dion Saucier, PA    This patient was not seen and evaluated by the attending physician No att. providers found.        CHIEF COMPLAINT       Chief Complaint   Patient presents with   ??? Pharyngitis     off and on nose bleed x 2 dys, mid sternal non radiating cp denies sob or cough. hx of gerd         HISTORY OF PRESENT ILLNESS  (Location/Symptom, Timing/Onset, Context/Setting, Quality, Duration, Modifying Factors, Severity.)   Deborah Riley is a 44 y.o. female who presents to the emergency department for epistaxis since yesterday and chest pain.  Epistaxis resolved on its own.  Denies ASA or anticoagulant use.  Chest pain is substernal since yesterday that is constant since yesterday at 8pm. She was eating dinner when pain started.   Neck and throat started hurting at that time also.  Reports she took Tylenol at that time. Reports difficulty swallowing.    also reports she has foul smelling breath. Denies shortness of breath.Denies cough, fever or chills, nausea vomiting. Also reports epigastric abdominal pain that is chronic.  she reports has hx of GERD but this feels different.  Denies sick contacts or exposure to COVID.  Denies health problems. Denies hx HTN, HLD, DM.  Not a smoker.  Denies personal hx of cardiac issues.  Denies family history of cardiac issues.      Patient speaks Nepali and hx was obtained via phone interpreter [phone] [facility].      Nursing Notes were reviewed and I agree.    REVIEW OF SYSTEMS    (2-9 systems for level 4, 10 or more for level 5)     Review of Systems   Constitutional: Negative for chills and fever.   HENT: Positive for sore throat.         +foul smelling breath   Respiratory: Negative for shortness of breath.    Cardiovascular: Positive for chest pain.   Gastrointestinal: Positive for  abdominal pain (chronic). Negative for nausea and vomiting.     Except as noted above the remainder of the review of systems was reviewed and negative.       PAST MEDICAL HISTORY         Diagnosis Date   ??? Depression    ??? H. pylori infection        SURGICAL HISTORY           Procedure Laterality Date   ??? OTHER SURGICAL HISTORY      none   ??? UPPER GASTROINTESTINAL ENDOSCOPY N/A 06/11/2018    EGD BIOPSY performed by Debria Garret, MD at [facility] ENDOSCOPY       CURRENT MEDICATIONS       Discharge Medication List as of 06/13/2019  7:57 PM      CONTINUE these medications which have NOT CHANGED    Details   omeprazole (PRILOSEC) 20 MG delayed release capsule Take 1 capsule by mouth 2 times daily, Disp-60 capsule,R-5Normal      sucralfate (CARAFATE) 1 GM tablet Take 1 tablet by mouth 4 times daily as needed (chest/abdominal pain), Disp-30 tablet, R-0Print      sucralfate (CARAFATE) 1 GM/10ML suspension Take 10 mLs by mouth 4 times daily, Disp-1200 mL, R-0Print  vitamin D (ERGOCALCIFEROL) 50000 units CAPS capsule Take 1 capsule by mouth once a week, Disp-12 capsule, R-0Normal             ALLERGIES     Patient has no known allergies.    FAMILY HISTORY           Problem Relation Age of Onset   ??? Heart Disease Father      Family Status   Relation Name Status   ??? Father  (Not Specified)        SOCIAL HISTORY      reports that she has quit smoking. She has never used smokeless tobacco. She reports that she does not drink alcohol or use drugs.    PHYSICAL EXAM    (up to 7 for level 4, 8 or more for level 5)     ED Triage Vitals [06/13/19 1722]   BP Temp Temp Source Pulse Resp SpO2 Height Weight   -- 98.3 ??F (36.8 ??C) Oral 71 14 99 % 5' (1.524 m) 148 lb (67.1 kg)       Physical Exam  Constitutional:       General: She is not in acute distress.     Appearance: Normal appearance. She is not ill-appearing, toxic-appearing or diaphoretic.   HENT:      Head: Normocephalic and atraumatic.      Right Ear: Tympanic membrane, ear  canal and external ear normal.      Left Ear: Tympanic membrane, ear canal and external ear normal.      Mouth/Throat:      Mouth: Mucous membranes are moist.      Pharynx: Oropharynx is clear. No oropharyngeal exudate or posterior oropharyngeal erythema.   Neck:      Musculoskeletal: Normal range of motion and neck supple.   Cardiovascular:      Rate and Rhythm: Normal rate and regular rhythm.      Heart sounds: Normal heart sounds.   Pulmonary:      Effort: Pulmonary effort is normal. No respiratory distress.      Breath sounds: Normal breath sounds. No stridor. No wheezing or rhonchi.   Abdominal:      General: There is no distension.      Palpations: Abdomen is soft. There is no mass.      Tenderness: There is no abdominal tenderness. There is no guarding or rebound.      Hernia: No hernia is present.   Musculoskeletal: Normal range of motion.   Lymphadenopathy:      Cervical: Cervical adenopathy present.   Skin:     General: Skin is warm.   Neurological:      Mental Status: She is alert.   Psychiatric:         Mood and Affect: Mood normal.         Behavior: Behavior normal.         Thought Content: Thought content normal.         Judgment: Judgment normal.         DIFFERENTIAL DIAGNOSIS   URI, strep, GERD, ACS, PE, dissection, other    DIAGNOSTICRESULTS     EKG: All EKG's are interpreted by Marcellus Scott, PA in the absence of a cardiologist.    EKG obtained.  See Dr. Clover Mealy note for interpretation.    RADIOLOGY:   Non-plain film images such as CT, Ultrasound and MRI are read by the radiologist. Plain radiographic images are visualized and preliminarily interpreted by Lowella Dell  Ceil Roderick, PA with the below findings:      Interpretation per the Radiologist below, if available at the time of this note:    XR CHEST (2 VW)   Final Result   Bibasilar dependent atelectasis/scarring.  No convincing evidence for acute   cardiopulmonary pathology.               LABS:  Results for orders placed or performed during  the hospital encounter of 06/13/19   Strep Screen Group A Throat    Specimen: Throat   Result Value Ref Range    Rapid Strep A Screen Negative Negative   CBC Auto Differential   Result Value Ref Range    WBC 8.0 4.0 - 11.0 K/uL    RBC 4.56 4.00 - 5.20 M/uL    Hemoglobin 13.3 12.0 - 16.0 g/dL    Hematocrit 28.3 66.2 - 48.0 %    MCV 89.1 80.0 - 100.0 fL    MCH 29.1 26.0 - 34.0 pg    MCHC 32.6 31.0 - 36.0 g/dL    RDW 94.7 65.4 - 65.0 %    Platelets 207 135 - 450 K/uL    MPV 10.4 5.0 - 10.5 fL    Neutrophils % 62.4 %    Lymphocytes % 24.7 %    Monocytes % 10.3 %    Eosinophils % 1.8 %    Basophils % 0.8 %    Neutrophils Absolute 5.0 1.7 - 7.7 K/uL    Lymphocytes Absolute 2.0 1.0 - 5.1 K/uL    Monocytes Absolute 0.8 0.0 - 1.3 K/uL    Eosinophils Absolute 0.1 0.0 - 0.6 K/uL    Basophils Absolute 0.1 0.0 - 0.2 K/uL   Brain Natriuretic Peptide   Result Value Ref Range    Pro-BNP <5 0 - 124 pg/mL   Comprehensive Metabolic Panel   Result Value Ref Range    Sodium 135 (L) 136 - 145 mmol/L    Potassium 5.4 (H) 3.5 - 5.1 mmol/L    Chloride 101 99 - 110 mmol/L    CO2 27 21 - 32 mmol/L    Anion Gap 7 3 - 16    Glucose 100 (H) 70 - 99 mg/dL    BUN 13 7 - 20 mg/dL    CREATININE 0.5 (L) 0.6 - 1.1 mg/dL    GFR Non-African American >60 >60    GFR African American >60 >60    Calcium 8.9 8.3 - 10.6 mg/dL    Total Protein 7.8 6.4 - 8.2 g/dL    Alb 3.7 3.4 - 5.0 g/dL    Albumin/Globulin Ratio 0.9 (L) 1.1 - 2.2    Total Bilirubin 0.5 0.0 - 1.0 mg/dL    Alkaline Phosphatase 73 40 - 129 U/L    ALT 12 10 - 40 U/L    AST 28 15 - 37 U/L    Globulin 4.1 g/dL   Troponin   Result Value Ref Range    Troponin <0.01 <0.01 ng/mL       All other labs were withinnormal range or not returned as of this dictation.    EMERGENCY DEPARTMENT COURSE and DIFFERENTIAL DIAGNOSIS/MDM:   Vitals:    Vitals:    06/13/19 1806 06/13/19 1959 06/13/19 2002 06/13/19 2003   BP:   104/73    Pulse: 76 72 71 71   Resp: (!) 34 21 23 15    Temp:       TempSrc:       SpO2:  Weight:       Height:           Patient was nontoxic, well appearing, afebrile with normal vital signs. Saturating well on room air.     I reviewed ED visit from 02/09/19 where she presented for epigastric and right sided chest pain. Lipase, BMP, HFP, CBC  Were normal.  Trop was negative. Had Ct chest for PE that was negative for PE.  was discharged on antacids and nausea medicaitons.     CBC normal.  CMP with potassium 5.4 but hemolyzed.  Troponin negative.  BNP not elevated.  Strep screen negative.  Chest x-ray negative.  Patient's chest pain has been constant since 8pm yesterday which is almost  24 hours with negative EKG and troponin.  Have a low suspicion for ACS PE or dissection.  She just had a CT chest for PE in September that was negative.  Additionally patient's main complaint to me as her sore throat.  Throat appears normal.  Strep screen is negative.  She also reports foul odor in her breath along with the chest pain.  I suspect this is GERD related.  Upon reevaluation clinically appears well.  He is appropriate for outpatient management.  Will discharge with Zofran and Pepcid.  I use the same phone interpreter for results and follow-up plan as I did for the HPI.  Patient was isntructed to follow-up with primary care doctor next few days for reevaluation return for worsening.  She agreed understood.      Heart Score for chest pain patients  History: Slightly Suspicious  ECG: Normal  Patient Age: > 45 and < 65 years  Risk Factors: No risk factors known  Troponin: < 1X normal limit  Heart Score Total: 1     I estimate there is LOW risk for PULMONARY EMBOLISM, ACUTE CORONARY SYNDROME, OR THORACIC AORTIC DISSECTION, thus I consider the discharge disposition reasonable.          PROCEDURES:  None    FINAL IMPRESSION      1. Acute pharyngitis, unspecified etiology    2. Chest pain, unspecified type          DISPOSITION/PLAN   DISPOSITION Decision To Discharge 06/13/2019 07:45:21 PM      PATIENT REFERRED  TO:  [facility]  [phone]  Schedule an appointment as soon as possible for a visit in 2 days  for reevaluation    [facility] Emergency Department  [address] [place] [address]  [phone]    As needed, If symptoms worsen      DISCHARGE MEDICATIONS:  Discharge Medication List as of 06/13/2019  7:57 PM          (Please note that portions of this note werecompleted with a voice recognition program.  Efforts were made to edit the dictations but occasionally words are mis-transcribed.)    Dion Saucier, [address] [place], [place]  06/13/19 2354

## 2019-06-13 NOTE — ED Provider Notes (Signed)
EKG: Normal sinus rhythm, rate of 64, no acute ST-T wave changes.  Rhythm strip shows a sinus rhythm with a rate of 64, PR interval 152 ms, QRS of 88 ms with no other ectopy as interpreted by me.  No significant change compared to 02/09/2019.     Thayer Headings, MD  06/13/19 2235

## 2019-06-14 LAB — EKG 12-LEAD
Atrial Rate: 64 {beats}/min
P Axis: 48 degrees
P-R Interval: 152 ms
Q-T Interval: 404 ms
QRS Duration: 88 ms
QTc Calculation (Bazett): 416 ms
R Axis: 2 degrees
T Axis: 4 degrees
Ventricular Rate: 64 {beats}/min

## 2019-06-14 MED ORDER — FAMOTIDINE 20 MG PO TABS
20 MG | ORAL_TABLET | Freq: Two times a day (BID) | ORAL | 0 refills | Status: DC
Start: 2019-06-14 — End: 2019-08-22

## 2019-06-14 MED ORDER — ONDANSETRON 4 MG PO TBDP
4 MG | ORAL_TABLET | Freq: Three times a day (TID) | ORAL | 0 refills | Status: DC | PRN
Start: 2019-06-14 — End: 2019-08-26

## 2019-06-15 LAB — CULTURE, BETA STREP CONFIRM PLATES: Strep A Culture: NORMAL

## 2019-08-22 ENCOUNTER — Emergency Department: Admit: 2019-08-22 | Payer: MEDICAID | Primary: Family

## 2019-08-22 ENCOUNTER — Inpatient Hospital Stay
Admit: 2019-08-22 | Discharge: 2019-08-22 | Disposition: A | Discharge status: Home / Self Care | Payer: MEDICAID | Attending: Emergency Medicine

## 2019-08-22 DIAGNOSIS — K219 Gastro-esophageal reflux disease without esophagitis: Secondary | ICD-10-CM

## 2019-08-22 LAB — URINALYSIS WITH REFLEX TO CULTURE
Bilirubin Urine: NEGATIVE
Glucose, Ur: NEGATIVE mg/dL
Ketones, Urine: NEGATIVE mg/dL
Leukocyte Esterase, Urine: NEGATIVE
Nitrite, Urine: NEGATIVE
Protein, UA: NEGATIVE mg/dL
Specific Gravity, UA: 1.016 (ref 1.005–1.030)
Urobilinogen, Urine: 1 E.U./dL (ref <2.0)
pH, UA: 7 (ref 5.0–8.0)

## 2019-08-22 LAB — BASIC METABOLIC PANEL W/ REFLEX TO MG FOR LOW K
Anion Gap: 8 (ref 3–16)
BUN: 12 mg/dL (ref 7–20)
CO2: 26 mmol/L (ref 21–32)
Calcium: 8.4 mg/dL (ref 8.3–10.6)
Chloride: 101 mmol/L (ref 99–110)
Creatinine: 0.6 mg/dL (ref 0.6–1.1)
GFR African American: >60 (ref >60)
GFR Non-African American: >60 (ref >60)
Glucose: 111 mg/dL — ABNORMAL HIGH (ref 70–99)
Potassium reflex Magnesium: 3.9 mmol/L (ref 3.5–5.1)
Sodium: 135 mmol/L — ABNORMAL LOW (ref 136–145)

## 2019-08-22 LAB — CBC WITH AUTO DIFFERENTIAL
Basophils %: 0.8 %
Basophils Absolute: 0.1 10*3/uL (ref 0.0–0.2)
Eosinophils %: 0.8 %
Eosinophils Absolute: 0.1 10*3/uL (ref 0.0–0.6)
Hematocrit: 38.6 % (ref 36.0–48.0)
Hemoglobin: 12.6 g/dL (ref 12.0–16.0)
Lymphocytes %: 22.3 %
Lymphocytes Absolute: 1.9 10*3/uL (ref 1.0–5.1)
MCH: 28.9 pg (ref 26.0–34.0)
MCHC: 32.6 g/dL (ref 31.0–36.0)
MCV: 88.6 fL (ref 80.0–100.0)
MPV: 10.9 fL — ABNORMAL HIGH (ref 5.0–10.5)
Monocytes %: 6.8 %
Monocytes Absolute: 0.6 10*3/uL (ref 0.0–1.3)
Neutrophils %: 69.3 %
Neutrophils Absolute: 6 10*3/uL (ref 1.7–7.7)
Platelets: 184 10*3/uL (ref 135–450)
RBC: 4.36 M/uL (ref 4.00–5.20)
RDW: 13.2 % (ref 12.4–15.4)
WBC: 8.6 10*3/uL (ref 4.0–11.0)

## 2019-08-22 LAB — MICROSCOPIC URINALYSIS
Epithelial Cells, UA: 11 /HPF — ABNORMAL HIGH (ref 0–5)
Hyaline Casts, UA: 1 /LPF (ref 0–8)
RBC, UA: 1 /HPF (ref 0–4)
WBC, UA: 10 /HPF — ABNORMAL HIGH (ref 0–5)

## 2019-08-22 LAB — HEPATIC FUNCTION PANEL
ALT: 13 U/L (ref 10–40)
AST: 20 U/L (ref 15–37)
Albumin: 3.9 g/dL (ref 3.4–5.0)
Alkaline Phosphatase: 65 U/L (ref 40–129)
Bilirubin, Direct: <0.2 mg/dL (ref 0.0–0.3)
Total Bilirubin: 0.4 mg/dL (ref 0.0–1.0)
Total Protein: 7.7 g/dL (ref 6.4–8.2)

## 2019-08-22 LAB — PREGNANCY, URINE: HCG(Urine) Pregnancy Test: NEGATIVE

## 2019-08-22 LAB — LIPASE: Lipase: 33 U/L (ref 13.0–60.0)

## 2019-08-22 LAB — TROPONIN: Troponin: <0.01 ng/mL (ref <0.01)

## 2019-08-22 MED ORDER — LIDOCAINE VISCOUS HCL 2 % MT SOLN
2 % | Freq: Once | OROMUCOSAL | Status: AC
Start: 2019-08-22 — End: 2019-08-22
  Administered 2019-08-22: 21:00:00 via ORAL

## 2019-08-22 MED ORDER — OMEPRAZOLE 40 MG PO CPDR
40 MG | ORAL_CAPSULE | Freq: Every day | ORAL | 0 refills | Status: DC
Start: 2019-08-22 — End: 2019-08-26

## 2019-08-22 MED ORDER — FAMOTIDINE 20 MG PO TABS
20 MG | ORAL_TABLET | Freq: Two times a day (BID) | ORAL | 0 refills | Status: DC
Start: 2019-08-22 — End: 2020-11-10

## 2019-08-22 MED ORDER — METOCLOPRAMIDE HCL 10 MG PO TABS
10 MG | Freq: Once | ORAL | Status: AC
Start: 2019-08-22 — End: 2019-08-22
  Administered 2019-08-22: 21:00:00 10 mg via ORAL

## 2019-08-22 MED ORDER — DIPHENHYDRAMINE HCL 50 MG/ML IJ SOLN
50 MG/ML | Freq: Once | INTRAMUSCULAR | Status: AC
Start: 2019-08-22 — End: 2019-08-22
  Administered 2019-08-22: 21:00:00 12.5 mg via INTRAVENOUS

## 2019-08-22 MED ORDER — FAMOTIDINE 20 MG/2ML IV SOLN
20 MG/2ML | Freq: Once | INTRAVENOUS | Status: AC
Start: 2019-08-22 — End: 2019-08-22
  Administered 2019-08-22: 21:00:00 20 mg via INTRAVENOUS

## 2019-08-22 MED FILL — DIPHENHYDRAMINE HCL 50 MG/ML IJ SOLN: 50 mg/mL | INTRAMUSCULAR | Qty: 1

## 2019-08-22 MED FILL — METOCLOPRAMIDE HCL 10 MG PO TABS: 10 mg | ORAL | Qty: 1

## 2019-08-22 MED FILL — FAMOTIDINE 20 MG/2ML IV SOLN: 20 MG/2ML | INTRAVENOUS | Qty: 2

## 2019-08-22 MED FILL — MAG-AL PLUS 200-200-20 MG/5ML PO LIQD: 200-200-20 MG/5ML | ORAL | Qty: 30

## 2019-08-22 NOTE — ED Provider Notes (Signed)
[facility] EMERGENCY DEPARTMENT  EMERGENCY DEPARTMENT ENCOUNTER        Pt Name: Deborah Riley  MRN: [id number]  Birthdate July 20, 1975  Date of evaluation: 08/22/2019  Provider: Jenetta Loges, PA-C  PCP: No primary care provider on file.     I have seen and evaluated this patient with my supervising physician No att. providers found.    CHIEF COMPLAINT       Chief Complaint   Patient presents with   ??? Shortness of Breath   ??? Abdominal Pain       HISTORY OF PRESENT ILLNESS   (Location, Timing/Onset, Context/Setting, Quality, Duration, Modifying Factors, Severity, Associated Signs and Symptoms)  Note limiting factors.     [name] is a 44 y.o. female who comes in with her daughter who helps with the translation as patient is primarily [place] speaking.  Patient indicates that just today she started noticing that her epigastric tenderness is worse again than it has been recently.  Also it hurts somewhat when the patient is swallowing.  She states that this is very consistent with her ongoing issues that she has been treated for by the family doctor as well as through a number of ER visits over the last couple of years.  She states that she has been out of her daily Prevacid that she was typically on.  She did not try to get back into the family doctor even though she has been out of the medications for probably a couple of months or so according to the estimation of her and her daughter.  She has not followed up as he recommended with GI.  She understands that she will need to have an endoscopy at some time but has not pursued that so far.  In the meantime this pain has come back and is relatively severe according to patient.  She states that she does not know of any foods that seem to trigger it.  No shortness of breath or heart palpitations, no syncope or near syncope.  No previous cardiac or lung disease.  No hypertension or cholesterol issues.  Patient does not smoke and does not use alcohol.   She is denying abdominal distention or difficulty taking fluids, no stool changes including no diarrhea or blood in stool.  She feels like she may have had some urinary frequency recently.  No recent fevers or chills or known infectious disease exposure.  No exertional chest pain or any dyspnea or cough compared to usual.    Nursing Notes were all reviewed and agreed with or any disagreements were addressed in the HPI.    REVIEW OF SYSTEMS    (2-9 systems for level 4, 10 or more for level 5)     Review of Systems  Positive history as above no fevers or chills, no cough or congestion.  No headache vision change neck or stiffness or shortness of breath or heart palpitations.  No dizziness confusion syncope or near syncope.  No abdominal distention or difficulty taking fluids or any acute urine or stool changes.  No extremity swelling or discoloration or acute loss range of motion or strength or sensation or rash.  Positives and Pertinent negatives as per HPI.  Except as noted above in the ROS, all other systems were reviewed and negative.       PAST MEDICAL HISTORY     Past Medical History:   Diagnosis Date   ??? Depression    ??? H. pylori infection  SURGICAL HISTORY     Past Surgical History:   Procedure Laterality Date   ??? OTHER SURGICAL HISTORY      none   ??? UPPER GASTROINTESTINAL ENDOSCOPY N/A 06/11/2018    EGD BIOPSY performed by Debria Garret, MD at [facility] END[facility]         CURRENTMEDICATIONS       Previous Medications    ONDANSETRON (ZOFRAN ODT) 4 MG DISINTEGRATING TABLET    Take 1 tablet by mouth every 8 hours as needed for Nausea or Vomiting Let dissolve in mouth.    SUCRALFATE (CARAFATE) 1 GM TABLET    Take 1 tablet by mouth 4 times daily as needed (chest/abdominal pain)    SUCRALFATE (CARAFATE) 1 GM/10ML SUSPENSION    Take 10 mLs by mouth 4 times daily    VITAMIN D (ERGOCALCIFEROL) 50000 UNITS CAPS CAPSULE    Take 1 capsule by mouth once a week         ALLERGIES     Patient has no known  allergies.    FAMILYHISTORY       Family History   Problem Relation Age of Onset   ??? Heart Disease Father           SOCIAL HISTORY       Social History     Tobacco Use   ??? Smoking status: Former Smoker   ??? Smokeless tobacco: Never Used   Substance Use Topics   ??? Alcohol use: No     Frequency: Never   ??? Drug use: No       SCREENINGS             PHYSICAL EXAM    (up to 7 for level 4, 8 or more for level 5)     ED Triage Vitals [08/22/19 1627]   BP Temp Temp Source Pulse Resp SpO2 Height Weight   107/70 97.2 ??F (36.2 ??C) Temporal 66 16 100 % 5\' 1"  (1.549 m) 143 lb 15.4 oz (65.3 kg)       Physical Exam  Vitals signs and nursing note reviewed.   Constitutional:       General: She is not in acute distress.     Appearance: Normal appearance. She is not ill-appearing, toxic-appearing or diaphoretic.   HENT:      Head: Normocephalic and atraumatic.      Right Ear: External ear normal.      Left Ear: External ear normal.      Nose: Nose normal.   Eyes:      General:         Right eye: No discharge.         Left eye: No discharge.      Conjunctiva/sclera: Conjunctivae normal.   Neck:      Musculoskeletal: Normal range of motion and neck supple.   Cardiovascular:      Rate and Rhythm: Normal rate and regular rhythm.      Pulses: Normal pulses.      Heart sounds: Normal heart sounds. No murmur. No gallop.    Pulmonary:      Effort: Pulmonary effort is normal. No respiratory distress.      Breath sounds: Normal breath sounds. No wheezing, rhonchi or rales.   Chest:      Comments: No chest wall tenderness or acute deformity  Abdominal:      General: Bowel sounds are normal.      Palpations: Abdomen is soft.      Tenderness:  There is abdominal tenderness. There is no guarding or rebound.      Comments: Some mild to moderate epigastric tenderness on deep palpation without Murphy sign or active Burney's point tenderness.  No peritoneal signs or tenderness otherwise.  No masses or pulsating masses or organomegaly.   Musculoskeletal:       Right lower leg: She exhibits no tenderness. No edema.      Left lower leg: No edema.   Skin:     General: Skin is warm and dry.      Capillary Refill: Capillary refill takes less than 2 seconds.   Neurological:      General: No focal deficit present.      Mental Status: She is alert and oriented to person, place, and time.   Psychiatric:         Mood and Affect: Mood normal.         Behavior: Behavior normal.         DIAGNOSTIC RESULTS   LABS:    Labs Reviewed   CBC WITH AUTO DIFFERENTIAL - Abnormal; Notable for the following components:       Result Value    MPV 10.9 (*)     All other components within normal limits    Narrative:     Performed at:  [facility]  [address] [place] [address]   Phone [phone]   BASIC METABOLIC PANEL W/ REFLEX TO MG FOR LOW K - Abnormal; Notable for the following components:    Sodium 135 (*)     Glucose 111 (*)     All other components within normal limits    Narrative:     Performed at:  [facility]  [address] [place] [address]   Phone [phone]   URINE RT REFLEX TO CULTURE - Abnormal; Notable for the following components:    Clarity, UA CLOUDY (*)     Blood, Urine TRACE (*)     All other components within normal limits    Narrative:     Performed at:  [facility]  [address] [place] [address]   Phone [phone]   MICROSCOPIC URINALYSIS - Abnormal; Notable for the following components:    Bacteria, UA 2+ (*)     WBC, UA 10 (*)     Epithelial Cells, UA 11 (*)     All other components within normal limits    Narrative:     Performed at:  [facility]  [address] [place] [address]   Phone [phone]   CULTURE, URINE   TROPONIN    Narrative:     Performed at:  [facility]  [address] [place] [address]   Phone [phone]   PREGNANCY,  URINE    Narrative:     Performed at:  [facility]  [address] [place] [address]   Phone [phone]   LIPASE    Narrative:     Performed at:  [facility]  [address] [place] [address]   Phone [phone]   HEPATIC FUNCTION PANEL    Narrative:     Performed at:  [facility]  [address]  [place], [place] [id number]   Phone [phone]       All other labs were within normal range or not returned as of this dictation.    EKG: All EKG's are interpreted by the Emergency Department Physician in the absence of a cardiologist.  Please see their note for interpretation of EKG.      RADIOLOGY:   Non-plain film images such as CT, Ultrasound and MRI are read by the radiologist. Plain radiographic images are visualized and preliminarily interpreted by the ED Provider with the below findings:        Interpretation per the Radiologist below, if available at the time of this note:    XR CHEST PORTABLE   Final Result   No evidence of acute cardiopulmonary disease.           Xr Chest Portable    Result Date: 08/22/2019  EXAMINATION: ONE XRAY VIEW OF THE CHEST 08/22/2019 5:05 pm COMPARISON: June 13, 2019 HISTORY: ORDERING SYSTEM PROVIDED HISTORY: pain TECHNOLOGIST PROVIDED HISTORY: Reason for exam:->pain Reason for Exam: sob, abdominal pain Acuity: Acute Type of Exam: Initial FINDINGS: No evidence of pneumonia, edema or other acute pulmonary process. No evidence of acute process of the cardiac or mediastinal structures. No evidence of pneumothorax or pleural effusion.     No evidence of acute cardiopulmonary disease.           PROCEDURES   Unless otherwise noted below, none     Procedures    CRITICAL CARE TIME   N/A    Cardiac score for the patient is a 1 out of 10.    CONSULTS:  None      EMERGENCY DEPARTMENT COURSE and DIFFERENTIAL DIAGNOSIS/MDM:   Vitals:    Vitals:    08/22/19 1831 08/22/19 1847  08/22/19 1900 08/22/19 1916   BP: 111/71 108/70 103/78 (!) 108/46   Pulse: 68 70 69 66   Resp: 26 27 21 19    Temp:       TempSrc:       SpO2:  100% 100% 97%   Weight:       Height:           Patient was given the following medications:  Medications   famotidine (PEPCID) injection 20 mg (20 mg Intravenous Given 08/22/19 1723)   metoclopramide (REGLAN) tablet 10 mg (10 mg Oral Given 08/22/19 1723)   aluminum & magnesium hydroxide-simethicone (MAALOX) 30 mL, lidocaine viscous hcl (XYLOCAINE) 5 mL (GI COCKTAIL) ( Oral Given 08/22/19 1723)   diphenhydrAMINE (BENADRYL) injection 12.5 mg (12.5 mg Intravenous Given 08/22/19 1723)           This patient presents as above and evaluation and treatment is begun.  EKG and chest x-ray obtained and EKG is interpreted by ED physician.  Please refer to that note for details.  Patient recognizes her symptoms as being similar to when she has been treated for GERD in the past.  At this time no suspicion for acute coronary syndrome also in the context of the labs obtained here and EKG and chest x-ray and cardiac score of 1.  Vital signs are stable and after medications patient is resting quite easily.  At this time no indication of other acute system compromise present.  Patient is educated again concerning the nature of her GERD and that close follow-up with GI specialist and family doctor is most appropriate for the symptoms that she recognizes.  We will prescribe some medications to help out at home and  otherwise discharge the patient according to the following instructions that she verbalizes understanding and agreement with.    Drink plenty of water, use medications as written and do diet modifications as to minimize discomfort.  Follow-up outpatient with your family doctor and gastroenterology for further care and treatment.  Return to the emergency department for any emergency worsen or concern.        FINAL IMPRESSION      1. Gastroesophageal reflux disease, unspecified whether  esophagitis present    2. Abdominal pain, epigastric          DISPOSITION/PLAN   DISPOSITION Decision To Discharge 08/22/2019 07:22:04 PM      PATIENT REFERREDTO:  Rudell Cobb, MD  [address] [place]  [address]  [place] [address]  [phone]      Stomach specialist    [facility]  [phone]          DISCHARGE MEDICATIONS:  New Prescriptions    FAMOTIDINE (PEPCID) 20 MG TABLET    Take 1 tablet by mouth 2 times daily    OMEPRAZOLE (PRILOSEC) 40 MG DELAYED RELEASE CAPSULE    Take 1 capsule by mouth every morning (before breakfast)       DISCONTINUED MEDICATIONS:  Discontinued Medications    FAMOTIDINE (PEPCID) 20 MG TABLET    Take 1 tablet by mouth 2 times daily for 5 days    OMEPRAZOLE (PRILOSEC) 20 MG DELAYED RELEASE CAPSULE    Take 1 capsule by mouth 2 times daily              (Please note that portions of this note were completed with a voice recognition program.  Efforts were made to edit the dictations but occasionally words are mis-transcribed.)    Tora Prunty Erby Pian, PA-C (electronically signed)            Jenetta Loges, PA-C  08/22/19 2129

## 2019-08-22 NOTE — ED Provider Notes (Addendum)
Date of evaluation: 08/22/2019    ED Attending Attestation Note     CHIEF COMPLAINT       Chief Complaint   Patient presents with   ??? Shortness of Breath   ??? Abdominal Pain     HISTORY OF PRESENT ILLNESS  (Location/Symptom, Timing/Onset,Context/Setting, Quality, Duration, Modifying Factors, Severity).  Note limiting factors.   This patient was seen by the advance practice provider.  I have seen and examined the patient, agree with the workup, evaluation, management and diagnosis. The care plan has been discussed.    Deborah Riley is a 44 y.o. female who presents to the emergency department secondary to concern for pain that is worse than her usual pain. She has presented many times for GERD and been referred to family and GI, has seen family once but GI never. Pain is located in the epigastric area and on the LUQ when she takes a deep breath. Swallowing also hurts the esophagus part of her throat. She has been prescribed multiple medications in the past (prilosec, zofran, Carafate, pepcid). She however has not been on the medicine for at least a couple of months. She states the pain is just like her usual pain when it gets worse. Has not yet had an EGD.     Past medical history significant for depression, and H. Pylori.     Aside from what is stated above denies any other symptoms or modifying factors.    Nursing Notes reviewed.    Past Surgical History:   Procedure Laterality Date   ??? OTHER SURGICAL HISTORY      none   ??? UPPER GASTROINTESTINAL ENDOSCOPY N/A 06/11/2018    EGD BIOPSY performed by Waynard Reeds, MD at [facility]       Family History   Problem Relation Age of Onset   ??? Heart Disease Father        CURRENT MEDICATIONS       Discharge Medication List as of 08/22/2019  7:36 PM      CONTINUE these medications which have NOT CHANGED    Details   ondansetron (ZOFRAN ODT) 4 MG disintegrating tablet Take 1 tablet by mouth every 8 hours as needed for Nausea or Vomiting Let dissolve in mouth., Disp-10  tablet, R-0Print      sucralfate (CARAFATE) 1 GM tablet Take 1 tablet by mouth 4 times daily as needed (chest/abdominal pain), Disp-30 tablet, R-0Print      sucralfate (CARAFATE) 1 GM/10ML suspension Take 10 mLs by mouth 4 times daily, Disp-1200 mL, R-0Print      vitamin D (ERGOCALCIFEROL) 50000 units CAPS capsule Take 1 capsule by mouth once a week, Disp-12 capsule, R-0Normal            DIAGNOSTIC RESULTS   EKG Indication: abdominal pain  EKG Interpretation: Rate 63, rhythm sinus, axis normal.  PR/QRS/QTc within normal limits.  No T/ST changes consistent with acute ischemia.  Comparison to prior EKG from November 11, 2019 shows no acute ischemic changes noted.    RADIOLOGY:   Interpretation per Radiologist below, if available at the time of this note:  XR CHEST PORTABLE   Final Result   No evidence of acute cardiopulmonary disease.           Patient was given the following medications:  Orders Placed This Encounter   Medications   ??? famotidine (PEPCID) injection 20 mg   ??? metoclopramide (REGLAN) tablet 10 mg   ??? aluminum & magnesium hydroxide-simethicone (MAALOX) 30 mL,  lidocaine viscous hcl (XYLOCAINE) 5 mL (GI COCKTAIL)   ??? diphenhydrAMINE (BENADRYL) injection 12.5 mg   ??? famotidine (PEPCID) 20 MG tablet     Sig: Take 1 tablet by mouth 2 times daily     Dispense:  40 tablet     Refill:  0   ??? omeprazole (PRILOSEC) 40 MG delayed release capsule     Sig: Take 1 capsule by mouth every morning (before breakfast)     Dispense:  30 capsule     Refill:  0       INITIAL VITALS: BP: 107/70, Temp: 97.2 ??F (36.2 ??C), Pulse: 66, Resp: 16, SpO2: 100 %   RECENT VITALS:  BP: 112/77,Temp: 98.9 ??F (37.2 ??C), Pulse: 62, Resp: 17, SpO2: 100 %     My assessment reveals a patient who presents with hemodynamically stable vital signs who on exam has mild tenderness to the epigastric region.  She has a known history of ulcers and H. pylori and through the interpreter she states she has completed treatment for H. pylori, not exactly sure  when but potentially 2 or 3 months ago.  She has not been following up with primary care (she tells me because they are too far away) or with GI (she states she knows she needs to follow-up with them and get an EGD she just has not).      Her work-up here shows no significant abnormalities.  After IV fluids, Pepcid, GI cocktail, Reglan she does report feeling a bit better.  She was able to p.o. challenge here without any issues.  Given her concern for following up with primary care is difficult because of where they are located she was also given a referral to a new primary care.  We discussed how the emergency department would always start over from square 1 and in order to improve her symptoms long-term it would be really important to follow-up with both primary care and with a GI specialist.  She verbalized understanding of this as did her daughter who is also at the bedside.      Since she has previously been on multiple types of medications and does not know which ones helped her the most we will order her both Pepcid and Prilosec.  We discussed starting with Pepcid and potentially switching to Prilosec should she continue have symptoms. Prior to discharge we also discussed return precautions as well which they expressed understanding of.    FINAL IMPRESSION      1. Gastroesophageal reflux disease, unspecified whether esophagitis present    2. Abdominal pain, epigastric        DISPOSITION/PLAN   DISPOSITION Decision To Discharge 08/22/2019 07:22:04 PM      PATIENT REFERRED TO:  Rudell Cobb, MD  [address]  [place] [address]  [phone]    Schedule an appointment as soon as possible for a visit   Stomach specialist, For follow up appointment    [facility]  [phone]  Schedule an appointment as soon as possible for a visit   For follow up appointment      DISCHARGE MEDICATIONS:  Discharge Medication List as of 08/22/2019  7:36 PM      START taking these medications     Details   famotidine (PEPCID) 20 MG tablet Take 1 tablet by mouth 2 times daily, Disp-40 tablet, R-0Print      omeprazole (PRILOSEC) 40 MG delayed release capsule Take 1 capsule by mouth every  morning (before breakfast), Disp-30 capsule, R-0Print                  (Please note that portions of this note were completed with a voice recognition program. Efforts were made to edit the dictations but occasionally words are mis-transcribed.)    Oran Rein, MD (electronically signed)  Attending Emergency Physician           Oran Rein, MD  08/23/19 [phone]

## 2019-08-22 NOTE — Discharge Instructions (Signed)
Drink plenty of water, use medications as written and do diet modifications as to minimize discomfort.  Follow-up outpatient with your family doctor and gastroenterology for further care and treatment.  Return to the emergency department for any emergency worsen or concern.

## 2019-08-22 NOTE — ED Notes (Signed)
D/C: Order noted for d/c. Pt confirmed d/c paperwork and two prescriptions have correct name. Discharge and education instructions reviewed with patient. Teach-back successful.  Pt verbalized understanding and signed d/c papers. Pt denied questions at this time. No acute distress noted. Patient instructed to follow-up as noted - return to emergency department if symptoms worsen. Patient verbalized understanding. Discharged per EDMD with discharge instructions. Pt discharged to private vehicle with daughter. Patient stable upon departure. Thanked patient for choosing [facility] for care. Provider aware of patient pain at time of discharge.       Redmond Baseman, RN  08/22/19 1956

## 2019-08-22 NOTE — ED Notes (Signed)
Patient ambulatory to restroom, steady gait. Pt back in room, resting comfortably in bed, no signs of acute distress noted. Urine sample obtained, sent to lab.      Londell Moh, RN  08/22/19 1731

## 2019-08-23 LAB — EKG 12-LEAD
Atrial Rate: 63 {beats}/min
P Axis: 48 degrees
P-R Interval: 148 ms
Q-T Interval: 386 ms
QRS Duration: 86 ms
QTc Calculation (Bazett): 395 ms
R Axis: 0 degrees
T Axis: 4 degrees
Ventricular Rate: 63 {beats}/min

## 2019-08-23 LAB — CULTURE, URINE: Urine Culture, Routine: <50000

## 2019-08-26 ENCOUNTER — Ambulatory Visit: Admit: 2019-08-26 | Discharge: 2019-08-26 | Payer: MEDICAID | Attending: Family | Primary: Family

## 2019-08-26 DIAGNOSIS — K219 Gastro-esophageal reflux disease without esophagitis: Secondary | ICD-10-CM

## 2019-08-26 MED ORDER — PANTOPRAZOLE SODIUM 40 MG PO TBEC
40 MG | ORAL_TABLET | Freq: Two times a day (BID) | ORAL | 1 refills | Status: DC
Start: 2019-08-26 — End: 2020-04-18

## 2019-08-26 MED ORDER — ONDANSETRON HCL 4 MG PO TABS
4 MG | ORAL_TABLET | Freq: Three times a day (TID) | ORAL | 0 refills | Status: DC | PRN
Start: 2019-08-26 — End: 2020-04-18

## 2019-08-30 ENCOUNTER — Institutional Professional Consult (permissible substitution): Admit: 2019-08-30 | Discharge: 2019-08-30 | Payer: MEDICAID | Attending: Gastroenterology | Primary: Family

## 2019-08-30 DIAGNOSIS — R1084 Generalized abdominal pain: Secondary | ICD-10-CM

## 2019-08-30 MED ORDER — SUCRALFATE 1 G PO TABS
1 GM | ORAL_TABLET | Freq: Three times a day (TID) | ORAL | 3 refills | Status: DC
Start: 2019-08-30 — End: 2020-11-10

## 2019-09-01 ENCOUNTER — Encounter

## 2019-09-22 NOTE — Telephone Encounter (Signed)
L/M for pt to return call re: over due [place] that Dr. Fara Boros ordered 08/30/19

## 2019-09-24 NOTE — Telephone Encounter (Signed)
L/M for pt to return call

## 2019-09-28 NOTE — Telephone Encounter (Signed)
Mailed patient the ultrasound order and wrote down [facility] central scheduling number

## 2019-12-17 ENCOUNTER — Encounter: Payer: MEDICAID | Attending: Family | Primary: Family

## 2020-04-18 ENCOUNTER — Inpatient Hospital Stay: Admit: 2020-04-18 | Discharge: 2020-04-19 | Disposition: A | Discharge status: Home / Self Care | Payer: MEDICAID

## 2020-04-18 ENCOUNTER — Emergency Department: Admit: 2020-04-18 | Payer: MEDICAID | Primary: Family

## 2020-04-18 DIAGNOSIS — K219 Gastro-esophageal reflux disease without esophagitis: Secondary | ICD-10-CM

## 2020-04-18 LAB — COMPREHENSIVE METABOLIC PANEL W/ REFLEX TO MG FOR LOW K
ALT: 15 U/L (ref 10–40)
AST: 22 U/L (ref 15–37)
Albumin/Globulin Ratio: 1.1 (ref 1.1–2.2)
Albumin: 3.9 g/dL (ref 3.4–5.0)
Alkaline Phosphatase: 73 U/L (ref 40–129)
Anion Gap: 8 (ref 3–16)
BUN: 11 mg/dL (ref 7–20)
CO2: 28 mmol/L (ref 21–32)
Calcium: 8.6 mg/dL (ref 8.3–10.6)
Chloride: 100 mmol/L (ref 99–110)
Creatinine: <0.5 mg/dL — ABNORMAL LOW (ref 0.6–1.1)
GFR African American: >60 (ref >60)
GFR Non-African American: >60 (ref >60)
Glucose: 88 mg/dL (ref 70–99)
Potassium reflex Magnesium: 4.1 mmol/L (ref 3.5–5.1)
Sodium: 136 mmol/L (ref 136–145)
Total Bilirubin: 0.4 mg/dL (ref 0.0–1.0)
Total Protein: 7.4 g/dL (ref 6.4–8.2)

## 2020-04-18 LAB — CBC WITH AUTO DIFFERENTIAL
Basophils %: 0.7 %
Basophils Absolute: 0 10*3/uL (ref 0.0–0.2)
Eosinophils %: 2.2 %
Eosinophils Absolute: 0.1 10*3/uL (ref 0.0–0.6)
Hematocrit: 38.6 % (ref 36.0–48.0)
Hemoglobin: 12.6 g/dL (ref 12.0–16.0)
Lymphocytes %: 30.1 %
Lymphocytes Absolute: 1.7 10*3/uL (ref 1.0–5.1)
MCH: 29 pg (ref 26.0–34.0)
MCHC: 32.5 g/dL (ref 31.0–36.0)
MCV: 89.1 fL (ref 80.0–100.0)
MPV: 10.8 fL — ABNORMAL HIGH (ref 5.0–10.5)
Monocytes %: 9.8 %
Monocytes Absolute: 0.5 10*3/uL (ref 0.0–1.3)
Neutrophils %: 57.2 %
Neutrophils Absolute: 3.1 10*3/uL (ref 1.7–7.7)
Platelets: 175 10*3/uL (ref 135–450)
RBC: 4.34 M/uL (ref 4.00–5.20)
RDW: 13.1 % (ref 12.4–15.4)
WBC: 5.5 10*3/uL (ref 4.0–11.0)

## 2020-04-18 LAB — TROPONIN: Troponin: <0.01 ng/mL (ref <0.01)

## 2020-04-18 NOTE — Discharge Instructions (Signed)
Return for any worsening symptoms to include abdominal pain, nausea, vomiting, diarrhea, chest pain, shortness of air, cough, headache, back pain, fever over 100.5, lightheaded, dizzy, weakness, numbness, tingling, confusion, syncope, worsening symptoms or further concerns.

## 2020-04-18 NOTE — ED Provider Notes (Signed)
[facility] EMERGENCY DEPARTMENT  EMERGENCY DEPARTMENT ENCOUNTER        Pt Name: Deborah Riley  MRN: [id number]  Birthdate 1975-06-06  Date of evaluation: 04/18/2020  Provider: Isa Rankin, APRN - CNP  PCP: Mickel Crow, APRN - CNP  Note Started: 8:53 PM EST       APP. I have evaluated this patient.  My supervising physician was available for consultation.    CHIEF COMPLAINT       Chief Complaint   Patient presents with   ??? Abdominal Pain     x2 days. +burning. +n/v       HISTORY OF PRESENT ILLNESS   (Location, Timing/Onset, Context/Setting, Quality, Duration, Modifying Factors, Severity, Associated Signs and Symptoms)  Note limiting factors.     Chief Complaint: epigastric abd pain     Deborah Riley is a 44 y.o. female  Depression, h.pylori, GERD who presents emergency department with epigastric abdominal pain that is a burning sensation.  Patient states the pain is "too much."She does note she was prescribed Protonix, Pepcid, Zofran, and Carafate in the past, although is not currently taking any of these medicines.  She is unsure when she has been out of these medicines but believe it has been for a few months.  I did go over her medical record informed her that she had been seen in the emergency department numerous times for GERD.  She did have an EGD done January 2020 that was unremarkable.  She was scheduled to have an EGD done May 2021, however this was canceled.  She previously did have biopsies collected.  She denies any recent follow-up with her PCP for the symptoms.  She does note anytime she tries to drink water or eat rice the symptoms seem worse.  She denies any vomiting, diarrhea, urinary symptoms, chest pain, cough, wheezing, headache, neck pain, back pain, lightheaded and dizzy, syncope, rashes, or fevers.  She has received her COVID-19 vaccine series Pfizer in June.  Former smoker, denies alcohol use or street drugs.  Denies any treatment of the symptoms at  home.    Nursing Notes were all reviewed and agreed with or any disagreements were addressed in the HPI.    REVIEW OF SYSTEMS    (2-9 systems for level 4, 10 or more for level 5)     Review of Systems    Positives and Pertinent negatives as per HPI. Except as noted above in the ROS, all other systems were reviewed and negative.       PAST MEDICAL HISTORY     Past Medical History:   Diagnosis Date   ??? Depression    ??? H. pylori infection          SURGICAL HISTORY     Past Surgical History:   Procedure Laterality Date   ??? OTHER SURGICAL HISTORY      none   ??? UPPER GASTROINTESTINAL ENDOSCOPY N/A 06/11/2018    EGD BIOPSY performed by Debria Garret, MD at [facility] END[facility]         CURRENTMEDICATIONS       Discharge Medication List as of 04/18/2020 10:06 PM      CONTINUE these medications which have NOT CHANGED    Details   sucralfate (CARAFATE) 1 GM tablet Take 1 tablet by mouth 3 times daily, Disp-90 tablet, R-3Normal      famotidine (PEPCID) 20 MG tablet Take 1 tablet by mouth 2 times daily, Disp-40 tablet, R-0Print  ALLERGIES     Patient has no known allergies.    FAMILYHISTORY       Family History   Problem Relation Age of Onset   ??? Heart Disease Father           SOCIAL HISTORY       Social History     Tobacco Use   ??? Smoking status: Former Smoker   ??? Smokeless tobacco: Never Used   Vaping Use   ??? Vaping Use: Never used   Substance Use Topics   ??? Alcohol use: No   ??? Drug use: No       SCREENINGS    Glasgow Coma Scale  Eye Opening: Spontaneous  Best Verbal Response: Oriented  Best Motor Response: Obeys commands  Glasgow Coma Scale Score: 15        PHYSICAL EXAM    (up to 7 for level 4, 8 or more for level 5)     ED Triage Vitals [04/18/20 1750]   BP Temp Temp Source Pulse Resp SpO2 Height Weight   133/77 97 ??F (36.1 ??C) Oral 61 18 98 % 5' (1.524 m) 132 lb 7.9 oz (60.1 kg)       Physical Exam  Vitals and nursing note reviewed.   Constitutional:       General: She is awake.      Appearance: Normal  appearance. She is well-developed and overweight.   HENT:      Head: Normocephalic and atraumatic.      Nose: Nose normal.   Eyes:      General:         Right eye: No discharge.         Left eye: No discharge.   Cardiovascular:      Rate and Rhythm: Normal rate and regular rhythm.      Heart sounds: Normal heart sounds.   Pulmonary:      Effort: Pulmonary effort is normal. No respiratory distress.      Breath sounds: Normal breath sounds.   Abdominal:      General: Bowel sounds are normal.      Palpations: Abdomen is soft.      Tenderness: There is no abdominal tenderness.   Musculoskeletal:         General: Normal range of motion.      Cervical back: Normal range of motion.      Right lower leg: No edema.      Left lower leg: No edema.   Skin:     General: Skin is warm and dry.      Coloration: Skin is not pale.   Neurological:      Mental Status: She is alert and oriented to person, place, and time.   Psychiatric:         Behavior: Behavior normal. Behavior is cooperative.         DIAGNOSTIC RESULTS   LABS:    Labs Reviewed   CBC WITH AUTO DIFFERENTIAL - Abnormal; Notable for the following components:       Result Value    MPV 10.8 (*)     All other components within normal limits    Narrative:     Performed at:  [facility]  [address] [place], [place] [address]   Phone [phone]   COMPREHENSIVE METABOLIC PANEL W/ REFLEX TO MG FOR LOW K - Abnormal; Notable for the following components:    CREATININE <0.5 (*)  All other components within normal limits    Narrative:     Performed at:  [facility]  [address] [place], [place] [address]   Phone [phone]   TROPONIN    Narrative:     Performed at:  [facility]  [address] [place], [place] [address]   Phone [phone]   LIPASE    Narrative:     Performed at:  [facility]  [address] [place], [place]  [address]   Phone [phone]       When ordered only abnormal lab results are displayed. All other labs were within normal range or not returned as of this dictation.    EKG: When ordered, EKG's are interpreted by the Emergency Department Physician in the absence of a cardiologist.  Please see their note for interpretation of EKG.    RADIOLOGY:   Non-plain film images such as CT, Ultrasound and MRI are read by the radiologist. Plain radiographic images are visualized and preliminarily interpreted by the ED Provider with the below findings:        Interpretation per the Radiologist below, if available at the time of this note:    XR CHEST (2 VW)   Final Result   No acute cardiopulmonary disease.           XR CHEST (2 VW)    Result Date: 04/18/2020  EXAMINATION: TWO XRAY VIEWS OF THE CHEST 04/18/2020 6:06 pm COMPARISON: 08/22/2019. HISTORY: ORDERING SYSTEM PROVIDED HISTORY: chest pain/abd pain TECHNOLOGIST PROVIDED HISTORY: Reason for exam:->chest pain/abd pain Reason for Exam: chest pain/abd pain FINDINGS: The cardiomediastinal silhouette is unremarkable.  The lungs are clear.  No infiltrate, pleural fluid or evidence of failure.  No acute osseous findings.     No acute cardiopulmonary disease.           PROCEDURES   Unless otherwise noted below, none     Procedures    CRITICAL CARE TIME   N/A    CONSULTS:  None      EMERGENCY DEPARTMENT COURSE and DIFFERENTIAL DIAGNOSIS/MDM:   Vitals:    Vitals:    04/18/20 1750 04/18/20 2154 04/18/20 2200 04/18/20 2213   BP: 133/77 128/71 117/80    Pulse: 61      Resp: 18      Temp: 97 ??F (36.1 ??C)      TempSrc: Oral   Oral   SpO2: 98% 99% 98%    Weight: 132 lb 7.9 oz (60.1 kg)      Height: 5' (1.524 m)          Patient was given the following medications:  Medications   aluminum & magnesium hydroxide-simethicone (MAALOX) 30 mL, lidocaine viscous hcl (XYLOCAINE) 5 mL (GI COCKTAIL) ( Oral Given 04/18/20 2146)           Care of this patient took place during the COVID-19 pandemic  emergency.    ED COURSE & MEDICAL DECISION MAKING    - The patient presented to the ER with complaints of  epigastric abdominal pain. Vital signs were reviewed. Exam well-developed, well-nourished female who appears uncomfortable. Peripheral IV placed. Labs, Imaging ordered.   - Pertinent Labs & Imaging studies reviewed. (See chart for details)   -  Patient seen and evaluated in the emergency department.  -  Triage and nursing notes reviewed and incorporated.  -  Old chart records reviewed  and incorporated.  -  APP. I have evaluated this patient.  My supervising physician was available for consultation.  -  Differential diagnosis includes: pelvic inflammatory disease, TOA, ovarian torsion, kidney stone, pyelonephritis, UTI, BV/vaginitis, cervicitis, ovarian cysts, round ligament pain, pregnancy (including ectopic), appendicitis, bowel obstruction, diverticulitis, hernia, gastroenteritis, colitis vs COVID-19  -  Work-up included:  See above  -  ED treatment included:   GI cocktail  -  Results discussed with patient.     Marcellus Scotthagi Landrigan is a 44 year old female with complaints of epigastric burning abdominal pain that has been ongoing for quite some time.  She previously was taking pantoprazole, Pepcid, Zofran, and Carafate, however ran out of this medication a few months ago and symptoms have persisted.  She tried to drink water and eat rice and this made the symptoms worse.  She did have a prior EGD last year that was unremarkable.  On exam she does have mild epigastric tenderness.  Lungs clear to auscultate.  CBC with MPV 10.8.  CMP with creatinine less than 0.5.  Troponin negative.  Chest x-ray with no acute cardiopulmonary disease.  Lipase 29.  Patient was given GI cocktail and is feeling better.  Patient was informed of these results.  She was given new prescriptions and instructed on medication compliance.  She was given strict return discharge instructions.  Shared decision making is completed patient stable for  discharge at this time.  She was given a refill of her prescriptions and instructed to follow-up with PCP and GI for additional outpatient testing and treatment as needed.      FINAL IMPRESSION      1. Gastroesophageal reflux disease without esophagitis    2. Gastroesophageal reflux disease, unspecified whether esophagitis present          DISPOSITION/PLAN   DISPOSITION        PATIENT REFERRED TO:  Florestine Aversimothy M Roark, APRN - CNP  [address]  [place] [id number]  [phone]    Call in 2 days  As needed, If symptoms worsen    [facility]  [address]  [place] [id number]  [phone]  Go to   As needed    Gerrie NordmannMatthew Atkinson, MD  [phone]  [place] [id number]  [phone]    Call   As needed      DISCHARGE MEDICATIONS:  Discharge Medication List as of 04/18/2020 10:06 PM          DISCONTINUED MEDICATIONS:  Discharge Medication List as of 04/18/2020 10:06 PM                 (Please note that portions of this note were completed with a voice recognition program.  Efforts were made to edit the dictations but occasionally words are mis-transcribed.)    Isa Rankinanielle C Perpetua Elling, APRN - CNP (electronically signed)            Isa Rankinanielle C Osric Klopf, APRN - CNP  04/19/20 0128

## 2020-04-18 NOTE — ED Notes (Signed)
Discharge and education instructions reviewed. Patient verbalized understanding, teach-back successful. Patient denied questions at this time. No acute distress noted. Patient instructed to follow-up as noted - return to emergency department if symptoms worsen. Patient verbalized understanding. Discharged per EDMD with discharged instructions.       Leonette Monarch, RN  04/18/20 2214

## 2020-04-18 NOTE — ED Provider Notes (Signed)
I did not have face-to-face interaction with this patient. I did not discuss the case with the advanced practice provider. I was available for consultation on the patient if deemed necessary by advanced practice provider.  EKG  The Ekg interpreted by me shows  sinus bradycardia, rate=57   Axis is   Normal  QTc is  normal  Intervals and Durations are unremarkable.      ST Segments: no acute change  No significant change from prior EKG dated 22 Aug 2019           Baldwin Crown, MD  04/18/20 2247

## 2020-04-19 LAB — EKG 12-LEAD
Atrial Rate: 57 {beats}/min
P Axis: 43 degrees
P-R Interval: 150 ms
Q-T Interval: 406 ms
QRS Duration: 86 ms
QTc Calculation (Bazett): 395 ms
R Axis: 4 degrees
T Axis: 1 degrees
Ventricular Rate: 57 {beats}/min

## 2020-04-19 LAB — LIPASE: Lipase: 29 U/L (ref 13.0–60.0)

## 2020-04-19 MED ORDER — PANTOPRAZOLE SODIUM 40 MG PO TBEC
40 MG | ORAL_TABLET | Freq: Two times a day (BID) | ORAL | 0 refills | Status: DC
Start: 2020-04-19 — End: 2020-11-10

## 2020-04-19 MED ORDER — ALUM & MAG HYDROXIDE-SIMETH 200-200-20 MG/5ML PO SUSP
200-200-20 | Freq: Once | ORAL | Status: AC
Start: 2020-04-19 — End: 2020-04-18
  Administered 2020-04-19: 03:00:00 via ORAL

## 2020-04-19 MED ORDER — ONDANSETRON HCL 4 MG PO TABS
4 MG | ORAL_TABLET | Freq: Three times a day (TID) | ORAL | 0 refills | Status: DC | PRN
Start: 2020-04-19 — End: 2020-11-10

## 2020-04-19 MED FILL — MAG-AL PLUS 200-200-20 MG/5ML PO LIQD: 200-200-20 MG/5ML | ORAL | Qty: 30

## 2020-11-03 ENCOUNTER — Inpatient Hospital Stay
Admit: 2020-11-03 | Discharge: 2020-11-03 | Payer: PRIVATE HEALTH INSURANCE | Attending: Student in an Organized Health Care Education/Training Program

## 2020-11-03 DIAGNOSIS — K006 Disturbances in tooth eruption: Secondary | ICD-10-CM

## 2020-11-03 NOTE — Unmapped (Signed)
[place]  Oral Maxillofacial Surgery      Visit Type:  NPV EXTRACTION  Pt. Name: Debra Davidson  Pt. MRN: [id number]  DOB: May 01, 1976              Sex: female  Visit Date:  11/03/2020  Provider: Attending: Erline Levine, DMD  Resident: Murriel Hopper, DMD  Location of Care: [facility]    Subjective:      Chief Complaint: New Consult - Oral Maxillofacial Surgery         [name] is a/an 45 y.o. female referred from general dentist for extraction of 3rd molars.  Pt speaks [place]- family present for exam to interpret. Patient reports intermittent pain from lower 3rd molars.  Pt denies swelling, drainage, trismus, NVFC, dyspnea, dysphagia, dysphonia, shortness of breath, chest pain or any other symptoms..     Past Med/Surg/Family/Social History:    Allergies:   NKDA  Medical History:   GERD  Stomach ulcers   Medications:   Denies  Surgical History:  Denies  Social History:  Denies tobacco, alcohol, recreational drug use    ROS:       Review of Systems: 10-point ROS completed and is negative except noted in H&P.       Objective:     Vitals:    11/03/20 1403   BP: 123/73     Body mass index is 26.17 kg/m??.      Physical Exam    Maxillofacial:   ?? Atraumatic  ?? Normo-cephalic  ?? No facial swelling  ?? No cervical masses or LAD  ?? No pain to digital palpation - bilaterally  ?? No clicking/popping/crepitus of TMJ  ?? Normal anterior and laterotrusive movements  ?? No trismus  ?? CN II-XII intact  ?? Maximum incisal opening WNL    Oral:   ?? Carious #1 and #16  ?? Poor oral hygiene, multiple stained teeth  ?? Normal salivary flow, mucosa moist and pink  ?? No vestibular edema/swelling/erythema  ?? No uvular deviation, FOM soft and non-tender  ?? No signs of acute infection  ?? No purulence or drainage or fistulae noted  ?? No soft tissue pathology    Third Molars:   1 - Erupted  16 - Erupted  17 - Erupted  32 - PBI    Neck: no significant adenopathy, no scars, thyroid normal size  Chest/Respiratory: no gross deformities, no apparent  respiratory distress  Cardiovascular: normal rate and rhythm  Neuro: cranial nerves grossly intact, sensation grossly intact, non focal, station & gait normal, appropriate mental status   Psych: affect and mood appropriate, normal interaction  Airway:    Normal and adequate cervical & mandibular range of motion  Thyromental distance >6cm  MIO >43mm  Tongue Size: normal  Mallampati Classification: 2    Radiographic Evaluation/Imaging    Maxillary sinuses are equal in size and radiodensity. Mandibular condyles are well-formed and seated in the glenoid fossa.  No other radiographic evidence of maxillary or mandibular pathology.           Assessment/Plan:     [name] is a/an 45 y.o. female with symptomatic 3rd molars and elects for extraction. Pt amenable to local anesthesia. Told family members taht interpreter would be needed at next appointment for informed consent.     ASA Classification: 2      1. Return for EXT 5,40,98,11 with local anesthesia.  2. Informed consent will be obtained on day of surgery.  3. Risks &  Benefits: Risks, benefits, complications and treatment options discussed with patient.           Murriel Hopper, DMD  11/03/2020 2:43 PM  [facility]  [facility] ORAL AND MAXILLOFACIAL SURGERY AT [facility]  [address]  [place] [address]  Dept: [phone]

## 2020-11-09 ENCOUNTER — Inpatient Hospital Stay
Admit: 2020-11-09 | Discharge: 2020-11-14 | Payer: PRIVATE HEALTH INSURANCE | Attending: Student in an Organized Health Care Education/Training Program

## 2020-11-09 DIAGNOSIS — K029 Dental caries, unspecified: Secondary | ICD-10-CM

## 2020-11-09 NOTE — Unmapped (Signed)
[place]   Oral Maxillofacial Surgery    Pt. Name: Debra Davidson  Pt. MRN: [id number]  DOB: [birth year]            Sex: female  Provider:  Sanda Klein, DDS  Resident: Cornelius Moras, DMD  Location of Care: [facility]    Procedure: Third Molar Extractions   Date of Procedure: 11/09/2020     H&P/Consult within 30 days of procedure: No (no changes)    Informed Consent/Counseling Statement:  Plan, alternatives and risks of anesthesia, including death have been explained to and discussed with the patient/legal guardian.  By my assessment, the patient/legal guardian understands and agrees. Scenario presented in detail. Question answered.    Anesthesia: Local  Informed consent was performed. Time Out performed. An interpreter was used to complete informed consent.    Gauze throat pack placed in oropharynx, bite block inserted.  Local anesthesia administered: 4 carpules of lidocaine 2% with 1:100k epi.                                                      2 carpules of marcaine 0.5% with 1:200k epi.     Tooth #1: Simple erupted: A periosteal elevator was used to sever the periodontal fibers. The tooth was elevated and removed from its socket with dental elevators and forceps without complication, the socket was suctioned and irrigated.  Hemostasis was achieved and the area was packed.  Tooth #16: Simple erupted: A periosteal elevator was used to sever the periodontal fibers. The tooth was elevated and removed from its socket with dental elevators and forceps without complication, the socket was suctioned and irrigated.  Hemostasis was achieved and the area was packed.  Tooth #17 Simple erupted: A periosteal elevator was used to sever the periodontal fibers. The tooth was elevated and removed from its socket with dental elevators and forceps without complication, the socket was suctioned and irrigated.  Hemostasis was achieved and the area was packed.  Tooth #32 Simple erupted: A periosteal elevator was used to sever  the periodontal fibers. The tooth was elevated and removed from its socket with dental elevators and forceps without complication, the socket was suctioned and irrigated.  Hemostasis was achieved and the area was packed.    Thorough suctioning of the oropharynx, removal of throat pack and bite block    Complications/Abnormal findings: N/A  Estimated Blood Loss: Minimal  Patient tolerated anesthesia and procedure well.    Rx: Ibuprofen 800 x 30 tabs   Tylenol 650 mg q6h    Postoperative instructions given both verbally and written. Extra gauze packs given to patient.  Disposition: Patient to follow up on an as needed basis    Signed by: Cornelius Moras  Date: 11/09/20   Time: 3:00 PM

## 2020-11-10 ENCOUNTER — Ambulatory Visit: Admit: 2020-11-10 | Discharge: 2020-11-10 | Payer: PRIVATE HEALTH INSURANCE | Attending: Family | Primary: Family

## 2020-11-10 DIAGNOSIS — Z0001 Encounter for general adult medical examination with abnormal findings: Secondary | ICD-10-CM

## 2020-11-10 LAB — LIPID PANEL
Cholesterol, Total: 167 mg/dL (ref 0–199)
HDL: 42 mg/dL (ref 40–60)
LDL Calculated: 114 mg/dL — ABNORMAL HIGH (ref <100)
Triglycerides: 56 mg/dL (ref 0–150)
VLDL Cholesterol Calculated: 11 mg/dL

## 2020-11-10 LAB — COMPREHENSIVE METABOLIC PANEL
ALT: 12 U/L (ref 10–40)
AST: 17 U/L (ref 15–37)
Albumin/Globulin Ratio: 1.3 (ref 1.1–2.2)
Albumin: 4.2 g/dL (ref 3.4–5.0)
Alkaline Phosphatase: 69 U/L (ref 40–129)
Anion Gap: 11 (ref 3–16)
BUN: 17 mg/dL (ref 7–20)
CO2: 25 mmol/L (ref 21–32)
Calcium: 9.2 mg/dL (ref 8.3–10.6)
Chloride: 101 mmol/L (ref 99–110)
Creatinine: 0.5 mg/dL — ABNORMAL LOW (ref 0.6–1.1)
GFR African American: >60 (ref >60)
GFR Non-African American: >60 (ref >60)
Glucose: 89 mg/dL (ref 70–99)
Potassium: 4.1 mmol/L (ref 3.5–5.1)
Sodium: 137 mmol/L (ref 136–145)
Total Bilirubin: 1.2 mg/dL — ABNORMAL HIGH (ref 0.0–1.0)
Total Protein: 7.4 g/dL (ref 6.4–8.2)

## 2020-11-10 LAB — VITAMIN D 25 HYDROXY: Vit D, 25-Hydroxy: 13 ng/mL — ABNORMAL LOW (ref >=30)

## 2020-11-10 MED ORDER — ESCITALOPRAM OXALATE 10 MG PO TABS
10 MG | ORAL_TABLET | Freq: Every day | ORAL | 0 refills | Status: DC
Start: 2020-11-10 — End: 2020-12-25

## 2020-11-10 MED ORDER — PANTOPRAZOLE SODIUM 40 MG PO TBEC
40 MG | ORAL_TABLET | Freq: Every day | ORAL | 3 refills | Status: AC
Start: 2020-11-10 — End: 2020-12-10

## 2020-11-10 MED ORDER — ONDANSETRON HCL 4 MG PO TABS
4 MG | ORAL_TABLET | Freq: Three times a day (TID) | ORAL | 0 refills | Status: AC | PRN
Start: 2020-11-10 — End: 2021-07-10

## 2020-11-10 MED ORDER — SUCRALFATE 1 G PO TABS
1 GM | ORAL_TABLET | Freq: Three times a day (TID) | ORAL | 3 refills | Status: AC
Start: 2020-11-10 — End: ?

## 2020-11-10 NOTE — Patient Instructions (Signed)
Patient Education        Well Visit, Ages 18 to 50: Care Instructions  Overview     Well visits can help you stay healthy. Your doctor has checked your overall health and may have suggested ways to take good care of yourself. Your doctor also may have recommended tests. At home, you can help prevent illness withhealthy eating, regular exercise, and other steps.  Follow-up care is a key part of your treatment and safety. Be sure to make and go to all appointments, and call your doctor if you are having problems. It's also a good idea to know your test results and keep alist of the medicines you take.  How can you care for yourself at home?  ??? Get screening tests that you and your doctor decide on. Screening helps find diseases before any symptoms appear.  ??? Eat healthy foods. Choose fruits, vegetables, whole grains, protein, and low-fat dairy foods. Limit fat, especially saturated fat. Reduce salt in your diet.  ??? Limit alcohol. If you are a man, have no more than 2 drinks a day or 14 drinks a week. If you are a woman, have no more than 1 drink a day or 7 drinks a week.  ??? Get at least 30 minutes of physical activity on most days of the week. Walking is a good choice. You also may want to do other activities, such as running, swimming, cycling, or playing tennis or team sports. Discuss any changes in your exercise program with your doctor.  ??? Reach and stay at a healthy weight. This will lower your risk for many problems, such as obesity, diabetes, heart disease, and high blood pressure.  ??? Do not smoke or allow others to smoke around you. If you need help quitting, talk to your doctor about stop-smoking programs and medicines. These can increase your chances of quitting for good.  ??? Care for your mental health. It is easy to get weighed down by worry and stress. Learn strategies to manage stress, like deep breathing and mindfulness, and stay connected with your family and community. If you find you often feel sad  or hopeless, talk with your doctor. Treatment can help.  ??? Talk to your doctor about whether you have any risk factors for sexually transmitted infections (STIs). You can help prevent STIs if you wait to have sex with a new partner (or partners) until you've each been tested for STIs. It also helps if you use condoms (female or female condoms) and if you limit your sex partners to one person who only has sex with you. Vaccines are available for some STIs, such as HPV.  ??? Use birth control if it's important to you to prevent pregnancy. Talk with your doctor about the choices available and what might be best for you.  ??? If you think you may have a problem with alcohol or drug use, talk to your doctor. This includes prescription medicines (such as amphetamines and opioids) and illegal drugs (such as cocaine and methamphetamine). Your doctor can help you figure out what type of treatment is best for you.  ??? Protect your skin from too much sun. When you're outdoors from 10 a.m. to 4 p.m., stay in the shade or cover up with clothing and a hat with a wide brim. Wear sunglasses that block UV rays. Even when it's cloudy, put broad-spectrum sunscreen (SPF 30 or higher) on any exposed skin.  ??? See a dentist one or two times a year for   checkups and to have your teeth cleaned.  ??? Wear a seat belt in the car.  When should you call for help?  Watch closely for changes in your health, and be sure to contact your doctor if you have any problems or symptoms that concern you.  Where can you learn more?  Go to [email or web address] and sign in to your MyChart account. Enter P072 in the Search Health Information box to learn more about "Well Visit, Ages 18 to 50: Care Instructions."     If you do not have an account, please click on the "Sign Up Now" link.  Current as of: March 01, 2020??????????????????????????????Content Version: 13.2  ?? 2006-2022 [employer].   Care instructions adapted under license by [facility].  If you have questions about a medical condition or this instruction, always ask your healthcare professional. [employer] disclaims any warranty or liability for your use of this information.

## 2020-11-10 NOTE — Progress Notes (Signed)
Fasting labs drawn RA/mv  2 sst

## 2020-11-10 NOTE — Progress Notes (Signed)
Deborah Riley (DOB:  May 30, 1975) is a 45 y.o. female,Established patient, here for evaluation of the following chief complaint(s):  Annual Exam (PT HERE FOR PHYSICAL AND FASTING LABS ) and Other (PT DECLINED INTERPRETER SERVICES )      ASSESSMENT/PLAN:  1. Annual visit for general adult medical examination with abnormal findings  -Abnormalities as below  -Fasting labs today  -Recommended follow-up with GI and also recommended that they discuss colonoscopy with GI when seen  -Recommend to make an appointment with gynecology for Pap smear  -They believe her vaccines are up-to-date  -Follow-up in 1 month for depression.  Once controlled, can then follow-up again annually.  -     Comprehensive Metabolic Panel; Future  -     Lipid Panel; Future  -     Vitamin D 25 Hydroxy; Future  2. Gastroesophageal reflux disease, unspecified whether esophagitis present  -Significantly uncontrolled at this time.  Patient has diffuse abdominal pain.  She believes that her breath has an odor to it.  I recommended that the patient and her son follow-up again with GI.  Emphasized the importance of of obtaining the recommended EGD.  Also recommended colonoscopy.  Discussed medications.  We will refill pantoprazole, sucralfate, and ondansetron.  Can follow-up in 1 month to ensure that they followed up with GI.  -     pantoprazole (PROTONIX) 40 MG tablet; Take 1 tablet by mouth daily, Disp-90 tablet, R-3Normal  -     sucralfate (CARAFATE) 1 GM tablet; Take 1 tablet by mouth 3 times daily, Disp-90 tablet, R-3Normal  -     ondansetron (ZOFRAN) 4 MG tablet; Take 1 tablet by mouth 3 times daily as needed for Nausea or Vomiting, Disp-20 tablet, R-0Normal  3. Episode of recurrent major depressive disorder, unspecified depression episode severity (HCC)  -Uncontrolled at this time as described by the son.  Positive depression screen today.  Discussed options.  Starting escitalopram.  Educated on the medication use as well as side effects.  Follow-up  in 1 month for depression.  This can be virtual given that they refuse interpreter.  -     escitalopram (LEXAPRO) 10 MG tablet; Take 1 tablet by mouth daily, Disp-30 tablet, R-0Normal  4. Vitamin D deficiency  -     Vitamin D 25 Hydroxy; Future  5. Pure hypercholesterolemia  -     Comprehensive Metabolic Panel; Future  -     Lipid Panel; Future      Return in about 1 month (around 12/10/2020) for Follow-up depression virtual.    SUBJECTIVE/OBJECTIVE:  HPI  Deborah Riley presents today with her son for annual physical.  They declined Nepali interpreter today.  In addition to the physical, the patient and her son have numerous concerns that need to be addressed.  Of note, during the visit, the patient did not speak.  All history was obtained by the son.  This was not directly translated based upon what the patient herself was saying but instead was given by the son himself.  History was also inconsistent and confusing, likely due to language barrier.  Nonetheless Nepali interpreter was declined.  The patient's son states that she has been struggling with depression.  She states that this is been an issue for her for multiple years.  He states that her husband passed away.  Family members have also passed away.  She has trouble coping with this.  He states that she was treated for depression in the fall, but she has not been treated  for it here.  He believes she would benefit from medication as opposed to seeing a psychiatrist.  He states that her work has commented on her mood as well.  They note that if her sister is not working with her, her mood is very low.  Depression screen was positive today, and questions were answered by the patient.    The patient is also complaining of abdominal pain.  This has been an issue for her for multiple years.  She has been referred to GI twice.  She received EGD in 2020 which was unremarkable.  Due to continued persistent symptoms, she was referred to GI again.  They recommended a repeat  EGD in 2021, but this was canceled and never rescheduled.  The patient's son states that she has been out of her GERD medication for quite some time.  They have been getting it refilled at the emergency room.  He is a poor historian with the specific medications and is unable to recall exactly which medication she was on most recently.  Has pantoprazole, famotidine, ondansetron, and sucralfate on her record.  They state that she has bad reflux every day.  Significant abdominal pain throughout.  Sometimes the pain goes up into her throat.  She is sometimes tearful because of the pain.  Has trouble eating because of the pain.  They are inquiring whether she can get back on her medication as well as what can be done to help with this.    Per the son, the patient does follow-up with a gynecologist but is overdue for an appointment.  Believes that she had her Tdap done when she came to [place].  Within the past 10 years.    Review of Systems   Constitutional: Negative for chills and fever.   HENT: Negative for ear discharge, ear pain, hearing loss, sinus pressure, sinus pain and sore throat.    Eyes: Negative for pain, discharge and redness.   Respiratory: Negative for cough, shortness of breath and wheezing.    Cardiovascular: Negative for chest pain and palpitations.   Gastrointestinal: Positive for abdominal distention, abdominal pain and nausea. Negative for diarrhea and vomiting.   Genitourinary: Negative for dysuria and hematuria.   Musculoskeletal: Negative for myalgias.   Skin: Negative for rash.   Neurological: Negative for dizziness, weakness, light-headedness, numbness and headaches.   Psychiatric/Behavioral: Positive for dysphoric mood. Negative for decreased concentration, self-injury, sleep disturbance and suicidal ideas. The patient is nervous/anxious.        Physical Exam  Constitutional:       General: She is not in acute distress.     Appearance: Normal appearance. She is normal weight.   HENT:       Head: Normocephalic and atraumatic.      Right Ear: Tympanic membrane, ear canal and external ear normal.      Left Ear: Tympanic membrane, ear canal and external ear normal.      Mouth/Throat:      Mouth: Mucous membranes are moist.   Eyes:      Extraocular Movements: Extraocular movements intact.      Conjunctiva/sclera: Conjunctivae normal.      Pupils: Pupils are equal, round, and reactive to light.   Neck:      Thyroid: No thyromegaly.      Vascular: No carotid bruit.   Cardiovascular:      Rate and Rhythm: Normal rate and regular rhythm.      Pulses: Normal pulses.  Heart sounds: Normal heart sounds. No murmur heard.  No gallop.    Pulmonary:      Effort: Pulmonary effort is normal.      Breath sounds: Normal breath sounds. No wheezing.   Abdominal:      General: There is no distension.      Palpations: Abdomen is soft. There is no mass.      Tenderness: There is abdominal tenderness. There is no guarding or rebound.      Hernia: No hernia is present.      Comments: Diffuse abdominal tenderness, worse in the upper quadrants.  Bowel sounds are hyperactive.   Musculoskeletal:         General: Normal range of motion.      Cervical back: Normal range of motion and neck supple.   Lymphadenopathy:      Cervical: No cervical adenopathy.   Skin:     General: Skin is warm and dry.      Capillary Refill: Capillary refill takes less than 2 seconds.   Neurological:      Mental Status: She is alert and oriented to person, place, and time.   Psychiatric:         Mood and Affect: Mood normal.         Behavior: Behavior normal.         Thought Content: Thought content normal.         Judgment: Judgment normal.           On this date 11/10/2020, in addition to the patient's annual physical, I have spent an additional 30 minutes reviewing previous notes, test results and face to face with the patient discussing the diagnosis and importance of compliance with the treatment plan as well as documenting on the day of the visit  on separate issues from the annual physical.    This dictation was generated by voice recognition computer software.  Although all attempts are made to edit the dictation for accuracy, there may be errors in the transcription that are not intended.    An electronic signature was used to authenticate this note.    --Mickel Crow, APRN - CNP

## 2020-12-25 ENCOUNTER — Encounter

## 2020-12-25 MED ORDER — ESCITALOPRAM OXALATE 10 MG PO TABS
10 MG | ORAL_TABLET | Freq: Every day | ORAL | 1 refills | Status: DC
Start: 2020-12-25 — End: 2021-06-12

## 2020-12-25 NOTE — Telephone Encounter (Signed)
PATIENT SON WALKED IN TO OFFICE TODAY. WOULD LIKE REFILL OF LEXAPRO. DOSAGE IS GOOD AND WORKING. PENDED REFILL. KMW

## 2021-06-11 ENCOUNTER — Encounter

## 2021-06-12 MED ORDER — ESCITALOPRAM OXALATE 10 MG PO TABS
10 MG | ORAL_TABLET | Freq: Every day | ORAL | 0 refills | Status: AC
Start: 2021-06-12 — End: ?

## 2021-06-12 NOTE — Telephone Encounter (Signed)
Future Appointments    This patient does not currently have any appointments scheduled.  Past Visits    Date Provider Specialty Visit Type Primary Dx   11/10/2020 Florestine Avers, APRN - CNP Family Medicine Office Visit Annual visit for general adult medical examination with abnormal findings

## 2021-07-03 ENCOUNTER — Inpatient Hospital Stay
Admit: 2021-07-03 | Discharge: 2021-07-03 | Disposition: A | Discharge status: Home / Self Care | Payer: Worker's Compensation

## 2021-07-03 DIAGNOSIS — S39012A Strain of muscle, fascia and tendon of lower back, initial encounter: Secondary | ICD-10-CM

## 2021-07-03 MED ORDER — lidocaine (LIDODERM) 5 %
5 | MEDICATED_PATCH | TOPICAL | 0 refills | 30.00000 days | Status: AC
Start: 2021-07-03 — End: ?

## 2021-07-03 MED ORDER — cyclobenzaprine (FLEXERIL) 10 MG tablet
10 | ORAL_TABLET | Freq: Three times a day (TID) | ORAL | 0 refills | Status: AC | PRN
Start: 2021-07-03 — End: ?

## 2021-07-03 MED ORDER — cyclobenzaprine (FLEXERIL) tablet 10 mg
10 | Freq: Once | ORAL | Status: AC
Start: 2021-07-03 — End: 2021-07-03
  Administered 2021-07-03: 06:00:00 10 mg via ORAL

## 2021-07-03 MED ORDER — ketorolac (TORADOL) injection 15 mg
15 | Freq: Once | INTRAMUSCULAR | Status: AC
Start: 2021-07-03 — End: 2021-07-03
  Administered 2021-07-03: 06:00:00 15 mg via INTRAMUSCULAR

## 2021-07-03 MED ORDER — acetaminophen (TYLENOL) 500 MG tablet
500 | ORAL_TABLET | Freq: Three times a day (TID) | ORAL | 0 refills | 11.00000 days | Status: AC
Start: 2021-07-03 — End: 2021-08-02

## 2021-07-03 MED ORDER — acetaminophen (TYLENOL) tablet 975 mg
325 | Freq: Once | ORAL | Status: AC
Start: 2021-07-03 — End: 2021-07-03
  Administered 2021-07-03: 06:00:00 975 mg via ORAL

## 2021-07-03 MED ORDER — ibuprofen (MOTRIN) 800 MG tablet
800 | ORAL_TABLET | Freq: Three times a day (TID) | ORAL | 0 refills | Status: AC | PRN
Start: 2021-07-03 — End: ?

## 2021-07-03 MED ORDER — lidocaine (LIDODERM) 5 % 1 patch
5 | Freq: Once | TOPICAL
Start: 2021-07-03 — End: 2021-07-03
  Administered 2021-07-03: 06:00:00 1 via TRANSDERMAL

## 2021-07-03 MED FILL — KETOROLAC 15 MG/ML INJECTION SOLUTION: 15 15 mg/mL | INTRAMUSCULAR | Qty: 1

## 2021-07-03 MED FILL — TYLENOL 325 MG TABLET: 325 325 mg | ORAL | Qty: 3

## 2021-07-03 MED FILL — LIDODERM 5 % TOPICAL PATCH: 5 5 % | TOPICAL | Qty: 1

## 2021-07-03 MED FILL — CYCLOBENZAPRINE 10 MG TABLET: 10 10 MG | ORAL | Qty: 1

## 2021-07-03 NOTE — Unmapped (Signed)
This patient was seen by the advanced practice provider. I have seen and examined the patient, agree with the workup, evaluation, management and diagnosis. Care plan has been discussed.      Assessment reveals 46 year old female, Nepalese speaking, who presents with back pain.  She was at work, lifting something heavy and felt back pain in her bilateral lower back.  It radiates down her bilateral buttocks.  It hurts worse when she is moving.  She has not had any falls, direct trauma, fevers or chills, bowel or bladder changes, numbness or weakness in the legs, IV drug use, or cancer history.    General:  Alert, no distress, uncomfortable  HEENT:  Head atraumatic, pupils equal round and reactive to light, extraocular movements intact, sclera clear, mucus membranes moist  Neck:  Supple, no LAD  Pulmonary:   CTAbilaterally, no wheezes, rhonchi, or rales  Cardiac:  RRR, normal S1S2, no MRG  Abdomen:  Soft, nontender, nondistended, no rebound and no guarding  Musculoskeletal:  No obvious deformities, no midline tenderness over spinal column or step-offs or deformities, bilateral lumbar paraspinal tenderness  Vascular:  2+ radial pulses bilaterally, 2+ dorsalis pedis pulses bilaterally  Skin:  Warm, dry, well perfused, no rashes  Neuro:  Alert and oriented x4, speech is clear and intact without dysarthria, gait intact  Psych:  Alert, Cooperative      Debra Davidson

## 2021-07-03 NOTE — Unmapped (Addendum)
Sherlin, I suspect that you have a strain in the muscles of your lower back.  I have given you prescription for ibuprofen, Tylenol, Flexeril and pain patches.  Please make sure that you do not drive or operate heavy machinery while you take the Flexeril medicine as it can make you sleepy.  I have written you a work note.  Please return to the emergency department if you are completely unable to walk, develop loss of control of your bowel or bladder.  I would like you to make an appointment with your primary care provider soon as possible for ER follow-up.  You may benefit from some physical therapy as an outpatient.    ????, ???? ???? ? ?? ?????? ????? ??????? ?????????? ???? ?? ???? ???????? ibuprofen, Tylenol, Flexeril ? ??????? ?????????? ???? ?????????????? ????? ??? ????? ????????? ????????? ?? ??????? Flexeril ???? ????? ???? ??????? ????? ?? ????? ?????????? ?????? ???? ???????? ?????? ????? ????? ???? ??????? ????? ??? ?????? ??? ??? ????? ????? ???????? ?????? ????????? ???, ??????? ??????? ?? ?????????? ????????? ??????? ??? ??? ????? ????????? ??????? ??????????? ? ??????? ER ???-???? ???? ??????? ????? ????? ???????? ?????? ?????????? ?????? ???? ???????? ??????? ?????? ???????? ????? ???? ??????? ????????? ??? ????? ???????????  Th?g?, mal?'? ?a?k? cha ki tap?'?k? tall? pach??ik? m?nsap???m? tan?va cha. Mail? tap?'?nl?'? ibuprofen, Tylenol, Flexeril ra dukh?'ik? py?cahar?k? l?gi priskrip?ana di'?k? chu. Kr?pay? suni?cita garnuh?s ki tap?'?nl? Flexeril au?adhi kh?m?d? bh?r? m?sinar? cal?'una v? sav?r? nagarnuh?s kinabhan? yasal? tap?'?nl?'? nidr? l?gna sakcha. Mail? tim?l?'? k?mak? n??a l?kh?k? Lacey Jensen tap?'?? hi?na p?r?atay? asak?ama hunuhuncha bhan?, tap?'??k? ?ndr? v? m?tr??ayak? niyantra?a gum?'unu bhay? bhan? kr?pay? ?p?tak?l?na vibh?gam? pharkanuh?s. Ma tap?'?l?'? ER phal?-apak? l?gi sak?sam'ma c?m??? ?phn? pr?thamika h?rac?ha prad?yakasam?ga bh??agh??a garna c?hanchu. Tap?'?nl? b?hir? bir?m?k?  r?pam? k?h? ??r?rika th?r?p?b??a l?bha u?h?'una saknuhuncha.

## 2021-07-03 NOTE — Unmapped (Signed)
Pt discharged to home. Pt verbalized understanding of discharge instructions. All questions answered. Ambulated from room with steady gait.

## 2021-07-03 NOTE — Unmapped (Signed)
[facility]     Date of evaluation: 07/03/2021    Chief Complaint     Reason for visit: Back Injury    Nursing notes, Past Medical History, Past Surgical History, Social History, Allergies, and Family History were reviewed.    Patient History     HPI: Debra Davidson is a 46 y.o. female with no significant past medical history who presents to the emergency department with back pain.  The patient states that the pain started around 9:30 PM this evening.  She states that it happened immediately after lifting something heavy at work.  She states that the pain is located throughout the entirety of her lumbar area.  She has not tried anything for symptoms.  She states that she is having sharp stabbing pain, paresthesias throughout her entire body.  I did utilize the Koreaepali interpreter to communicate with this patient.  It was hard for me to delineate if she was having any radicular symptoms in her lower extremities or not as she was stating that she was having this pain throughout her whole body.  However I was able to discern that she was not complaining of any saddle anesthesias, bowel or bladder incontinence/retention.  She has no history of malignancy or IV drug use.  Denies any fever.  She states that she was completely asymptomatic prior to lifting this heavy object at work.  She states that she is able to ambulate, however it is very difficult because of the pain.  She denies any other injury.    Nepali interpreter utilized to communicate with this patient    With the exception of the above, there are no aggravating or alleviating factors.    Medical History: History reviewed. No pertinent past medical history.    Surgical History: History reviewed. No pertinent surgical history.    Medications:   Current Facility-Administered Medications:   ???  lidocaine (LIDODERM) 5 % 1 patch, 1 patch, Transdermal, Once, Andris FlurryMiranda Justo Hengel, PA, 1 patch at 07/03/21  0111    Current Outpatient Medications:   ???  acetaminophen (TYLENOL) 500 MG tablet, Take 2 tablets (1,000 mg total) by mouth every 8 hours for 30 days., Disp: 30 tablet, Rfl: 0  ???  cyclobenzaprine (FLEXERIL) 10 MG tablet, Take 1 tablet (10 mg total) by mouth 3 times a day as needed for Muscle spasms., Disp: 10 tablet, Rfl: 0  ???  ibuprofen (MOTRIN) 800 MG tablet, Take 1 tablet (800 mg total) by mouth every 8 hours as needed for Pain., Disp: 30 tablet, Rfl: 0  ???  lidocaine (LIDODERM) 5 %, Place 1 patch onto the skin daily. Apply patch for 12 hours and then remove patch and leave off for 12 hours., Disp: 30 patch, Rfl: 0    Social History: Debra Davidson  reports that she does not drink alcohol and does not use drugs. No history on file for tobacco use.    Allergies:   Allergies as of 07/03/2021   ??? (No Known Allergies)        Review of Systems     Review of Systems   Constitutional: Negative for chills, fever and malaise/fatigue.   HENT: Negative for congestion, ear pain, sinus pain and sore throat.    Eyes: Negative for blurred vision, double vision and photophobia.   Respiratory: Negative for cough, sputum production and shortness of breath.    Cardiovascular: Negative for chest pain, palpitations and leg swelling.   Gastrointestinal: Negative for abdominal pain,  diarrhea, nausea and vomiting.   Genitourinary: Negative for dysuria, frequency and urgency.   Musculoskeletal: Positive for back pain. Negative for myalgias and neck pain.   Skin: Negative for rash.   Neurological: Negative for focal weakness, loss of consciousness and headaches.   Psychiatric/Behavioral: Negative for depression and suicidal ideas.     As stated above, all other systems reviewed and are otherwise negative.     Patient History     Vitals:    07/03/21 0039 07/03/21 0207   BP: 122/75    BP Location: Right arm    Patient Position: Lying    Pulse: 58 56   Resp: 16 16   Temp: 98 ??F (36.7 ??C)    TempSrc: Oral    SpO2: 100%        Physical  Exam  Constitutional:       General: She is not in acute distress.     Appearance: Normal appearance. She is normal weight. She is not toxic-appearing.      Comments: Uncomfortable appearing   HENT:      Head: Normocephalic and atraumatic.      Right Ear: External ear normal.      Left Ear: External ear normal.      Nose: Nose normal.      Mouth/Throat:      Mouth: Mucous membranes are moist.   Eyes:      Extraocular Movements: Extraocular movements intact.      Pupils: Pupils are equal, round, and reactive to light.   Cardiovascular:      Rate and Rhythm: Normal rate and regular rhythm.      Pulses: Normal pulses.      Heart sounds: Normal heart sounds.   Pulmonary:      Effort: Pulmonary effort is normal.      Breath sounds: Normal breath sounds.   Abdominal:      General: Abdomen is flat. Bowel sounds are normal. There is no distension.      Palpations: Abdomen is soft.      Tenderness: There is no abdominal tenderness. There is no guarding or rebound.   Musculoskeletal:      Cervical back: Normal range of motion and neck supple.      Comments: Tenderness to the bilateral lumbar paraspinal musculature.  No midline tenderness.  No overlying skin changes.  Bilateral lower extremity strength 5/5.  Sensation is intact.   Skin:     Capillary Refill: Capillary refill takes less than 2 seconds.      Findings: No rash.   Neurological:      Mental Status: She is alert and oriented to person, place, and time.   Psychiatric:         Mood and Affect: Mood normal.         Behavior: Behavior normal.         Diagnostic Studies     Labs: Please see electronic medical record for any tests performed in the ED   Labs Reviewed - No data to display    IMAGING STUDIES / RADIOLOGY: Please see electronic medical record for any tests performed in the ED  No orders to display         ED Procedures     Procedures  None    ED Course     ED Medications Administered:  Medications   lidocaine (LIDODERM) 5 % 1 patch (1 patch Transdermal Patch  Applied 07/03/21 0111)   ketorolac (TORADOL) injection 15 mg (15  mg Intramuscular Given 07/03/21 0105)   cyclobenzaprine (FLEXERIL) tablet 10 mg (10 mg Oral Given 07/03/21 0105)   acetaminophen (TYLENOL) tablet 975 mg (975 mg Oral Given 07/03/21 0105)       CONSULTS: None     MDM     Vitals:    07/03/21 0039 07/03/21 0207   BP: 122/75    BP Location: Right arm    Patient Position: Lying    Pulse: 58 56   Resp: 16 16   Temp: 98 ??F (36.7 ??C)    TempSrc: Oral    SpO2: 100%        Debra Davidson is a 46 y.o. female who presents to the emergency department with low back pain after lifting something heavy at work several hours ago.  The patient is complaining of sharp and shooting pain as well as paresthesias throughout her entire body.  Denies any red flag symptoms such as saddle anesthesias, bowel or bladder incontinence and retention, no history malignancy or IVDU.  She is still able to ambulate.  Of note, her family at bedside is asking for a x-ray as he states he thinks he heard something crack when the pain started.  On my exam, the patient is hemodynamically stable and afebrile.  She is in no acute distress.  She has tenderness throughout the bilateral lumbar paraspinal musculature without any midline tenderness.  She has a normal neuromuscular exam.    Given the symptoms, highly suspect that the patient has a lumbar muscle strain.  As stated however it is difficult for me to discern if she was having any radicular symptoms as she states that she is having some paresthesias and sharp/shooting pain throughout her entire body.  Hard for me to discern if she is having any radicular symptoms given this.  However she does lack any red flag symptoms that would warrant emergent MRI imaging.  As stated, her family at bedside is requesting an x-ray of the lumbar spine.  However, I discussed with them that the reasoning I would do this is if I suspected a osseous abnormality, given the mechanism of injury, it is highly unlikely that  she has any osseous involvement and therefore I will defer this at this moment.  The patient will be given Toradol, acetaminophen, Lidoderm and Flexeril.  She does have a primary care provider that she will follow-up with.  Upon reassessment, the patient had improvement of the pain and felt comfortable going home.  Patient was again instructed to follow-up with her PCP.  She was given strict return precautions including saddle anesthesias, bowel or bladder incontinence/retention, inability to walk, severe/unbearable pain.  The patient was in agreement to this plan.  She was given a work note.  She is given prescriptions for Tylenol, ibuprofen, Lidoderm and Flexeril.  She was instructed to not drive or operate heavy machinery while taking this medication.    Medical Decision Making  Strain of lumbar region, initial encounter: acute illness or injury  Risk  OTC drugs.  Prescription drug management.      The patient was evaluated by myself and the ED Attending Physician, Dr. Lamar Sprinkles. All management and disposition plans were discussed and agreed upon.    This note was dictated using voice-recognition software, which occasionally leads to inadvertent typographic errors.    Clinical Impression     1. Strain of lumbar region, initial encounter        Disposition     Discharged from the ED. See AVS for  prescriptions, followup, and discharge instructions.     DISCHARGE MEDICATIONS:   New Prescriptions    ACETAMINOPHEN (TYLENOL) 500 MG TABLET    Take 2 tablets (1,000 mg total) by mouth every 8 hours for 30 days.    CYCLOBENZAPRINE (FLEXERIL) 10 MG TABLET    Take 1 tablet (10 mg total) by mouth 3 times a day as needed for Muscle spasms.    IBUPROFEN (MOTRIN) 800 MG TABLET    Take 1 tablet (800 mg total) by mouth every 8 hours as needed for Pain.    LIDOCAINE (LIDODERM) 5 %    Place 1 patch onto the skin daily. Apply patch for 12 hours and then remove patch and leave off for 12 hours.       PATIENT REFERRED TO:   [facility] Emergency Department  [address]  [place] [address]  [phone]  Go to   As needed, If symptoms worsen      Please make an appointment with your primary care provider          Andris Flurry, PA-C  Department of Emergency Medicine     [place], [place]  07/03/21 [phone]

## 2021-07-03 NOTE — Unmapped (Signed)
Pt was at work and was lifting something heavy. Now having back pain.

## 2021-07-05 ENCOUNTER — Encounter: Payer: PRIVATE HEALTH INSURANCE | Attending: Family | Primary: Family

## 2021-07-10 ENCOUNTER — Encounter

## 2021-07-10 ENCOUNTER — Encounter: Admit: 2021-07-10 | Discharge: 2021-08-24 | Payer: PRIVATE HEALTH INSURANCE | Attending: Family | Primary: Family

## 2021-07-10 DIAGNOSIS — N644 Mastodynia: Secondary | ICD-10-CM

## 2021-07-10 MED ORDER — FLUOCINONIDE 0.05 % EX CREA
0.05 | CUTANEOUS | 0 refills | Status: DC
Start: 2021-07-10 — End: 2021-11-01

## 2021-07-10 MED ORDER — ONDANSETRON HCL 4 MG PO TABS
4 MG | ORAL_TABLET | Freq: Three times a day (TID) | ORAL | 0 refills | Status: AC | PRN
Start: 2021-07-10 — End: ?

## 2021-07-10 NOTE — Progress Notes (Signed)
Rash on thumb, open dry area    Zofran refill

## 2021-07-10 NOTE — Addendum Note (Signed)
Addended by: Domingo Madeira on: 07/10/2021 05:10 PM     Modules accepted: Orders, Level of Service

## 2021-07-10 NOTE — Addendum Note (Signed)
Addended by: Domingo Madeira on: 07/10/2021 05:16 PM     Modules accepted: Orders

## 2021-07-10 NOTE — Patient Instructions (Signed)
How to schedule mammogram:  Call [phone])  to schedule imaging

## 2021-07-10 NOTE — Progress Notes (Deleted)
This encounter was created in error - please disregard.

## 2021-07-10 NOTE — Progress Notes (Addendum)
Date of Service:  07/10/2021    Deborah Riley (DOB:  [birth year]) is a 46 y.o. female, here for evaluation of the following medical concerns:    Chief Complaint   Patient presents with    Back Pain     Lower back pain x1 wk  Interpreter ID: [id number]    Rash    Nausea    Breast Pain        HPI    Patient reports back pain initially- we are not addressing this as it was a work related injury. Son upset- but was given work occupational health number or can contact patient [employer] work for further steps as we will not handle the workman comp issue. Initially patient was only here for back pain and son was angry we were not willing to discuss this since it was a work injury. Given occupational health number.     Patient also reports issue with right thumb rash and pain ongoing x 1 year. Dermatitis. Prescribed an ointment for patient. See image below. Discussed proper hand hygiene. Starting on other thumb as well but is very mild.     Zofran refill sent for patient per request. Pt also reports difficulty with swallowing.     Patient then brought up breast pain and chest pain intermittently. 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 Youngest child is 57 yo.     Review of Systems   Constitutional:  Negative for activity change, appetite change, fatigue, fever and unexpected weight change.   HENT:  Negative for congestion, ear pain, sinus pressure and sinus pain.    Eyes:  Negative for discharge and visual disturbance.   Respiratory:  Negative for cough, chest tightness and shortness of breath.    Cardiovascular:  Positive for chest pain (breasts). Negative for palpitations and leg swelling.   Gastrointestinal:  Positive for nausea. Negative for abdominal distention, abdominal pain, constipation and diarrhea.   Endocrine: Negative for cold intolerance, heat intolerance, polydipsia, polyphagia and polyuria.   Genitourinary:  Negative for decreased urine  volume, difficulty urinating, dysuria, flank pain, frequency and urgency.   Musculoskeletal:  Positive for back pain. Negative for arthralgias, gait problem, joint swelling, myalgias and neck pain.   Skin:  Positive for rash and wound (right thumb). Negative for color change.   Allergic/Immunologic: Negative for food allergies and immunocompromised state.   Neurological:  Negative for dizziness, tremors, speech difficulty, weakness, light-headedness, numbness and headaches.   Hematological:  Negative for adenopathy. Does not bruise/bleed easily.   Psychiatric/Behavioral:  Negative for confusion, decreased concentration, self-injury, sleep disturbance and suicidal ideas. The patient is not nervous/anxious.      Prior to Visit Medications    Medication Sig Taking? Authorizing Provider   cyclobenzaprine (FLEXERIL) 10 MG tablet Take 10 mg by mouth 3 times daily as needed Yes Historical Provider, MD   escitalopram (LEXAPRO) 10 MG tablet TAKE 1 TABLET BY MOUTH IN THE MORNING Yes Luke A Glischinski, APRN - CNP   pantoprazole (PROTONIX) 40 MG tablet Take 1 tablet by mouth daily Yes Florestine Avers, APRN - CNP   sucralfate (CARAFATE) 1 GM tablet Take 1 tablet by mouth 3 times daily Yes Florestine Avers, APRN - CNP   fluocinonide (LIDEX) 0.05 % cream Apply topically 2 times daily to thumb  Domingo Madeira, APRN - CNP   ondansetron (ZOFRAN) 4 MG tablet Take 1 tablet by mouth  3 times daily as needed for Nausea or Vomiting  Domingo Madeira, APRN - CNP        Social History     Tobacco Use    Smoking status: Former    Smokeless tobacco: Never   Substance Use Topics    Alcohol use: No        Vitals:    07/10/21 1543   BP: 118/76   Site: Left Upper Arm   Position: Sitting   Cuff Size: Medium Adult   Pulse: 60   Resp: 16   SpO2: 98%   Weight: 135 lb (61.2 kg)   Height: 5' (1.524 m)     Estimated body mass index is 26.37 kg/m?? as calculated from the following:    Height as of this encounter: 5' (1.524 m).    Weight as  of this encounter: 135 lb (61.2 kg).    Physical Exam  Vitals reviewed.   Constitutional:       General: She is awake.      Appearance: Normal appearance. She is well-developed, well-groomed and overweight. She is not ill-appearing.   HENT:      Head: Normocephalic and atraumatic.      Right Ear: Hearing, tympanic membrane, ear canal and external ear normal.      Left Ear: Hearing, tympanic membrane, ear canal and external ear normal.      Nose: Nose normal.      Mouth/Throat:      Lips: Pink.      Mouth: Mucous membranes are moist.      Pharynx: Oropharynx is clear.   Eyes:      General: Lids are normal.      Extraocular Movements: Extraocular movements intact.      Conjunctiva/sclera: Conjunctivae normal.      Pupils: Pupils are equal, round, and reactive to light.   Neck:      Thyroid: No thyromegaly.      Vascular: No carotid bruit.   Cardiovascular:      Rate and Rhythm: Normal rate.      Pulses:           Carotid pulses are 2+ on the right side and 2+ on the left side.       Radial pulses are 2+ on the right side and 2+ on the left side.        Posterior tibial pulses are 2+ on the right side and 2+ on the left side.      Heart sounds: Normal heart sounds, S1 normal and S2 normal. No murmur heard.  Pulmonary:      Effort: Pulmonary effort is normal.      Breath sounds: Normal breath sounds.   Abdominal:      General: Bowel sounds are normal. There is no abdominal bruit.      Palpations: Abdomen is soft.      Tenderness: There is no abdominal tenderness.   Genitourinary:     Comments: Deferred  Musculoskeletal:         General: Normal range of motion.      Cervical back: Full passive range of motion without pain, normal range of motion and neck supple.      Right lower leg: No edema.      Left lower leg: No edema.   Lymphadenopathy:      Head:      Right side of head: No submental, submandibular, tonsillar, preauricular, posterior auricular or occipital adenopathy.  Left side of head: No submental,  submandibular, tonsillar, preauricular, posterior auricular or occipital adenopathy.      Cervical: No cervical adenopathy.      Right cervical: No superficial, deep or posterior cervical adenopathy.     Left cervical: No superficial, deep or posterior cervical adenopathy.      Upper Body:      Right upper body: No supraclavicular adenopathy.      Left upper body: No supraclavicular adenopathy.   Skin:     General: Skin is warm and dry.      Capillary Refill: Capillary refill takes less than 2 seconds.      Findings: Rash present. Rash is crusting and papular.          Neurological:      General: No focal deficit present.      Mental Status: She is alert and oriented to person, place, and time. Mental status is at baseline.      Sensory: Sensation is intact.      Motor: Motor function is intact.      Coordination: Coordination is intact.      Gait: Gait is intact.   Psychiatric:         Attention and Perception: Attention and perception normal.         Mood and Affect: Mood and affect normal.         Speech: Speech normal.         Behavior: Behavior normal. Behavior is cooperative.         Thought Content: Thought content normal.         Cognition and Memory: Cognition and memory normal.         Judgment: Judgment normal.           ASSESSMENT/PLAN:  1. Pain of both breasts  -     MAM DIGITAL DIAGNOSTIC W OR WO CAD BILATERAL; Future  Please call to schedule your mammogram   Diagnostic since having breast pain and suffered mastitis in the past  Information printed and reviewed  2. Gastroesophageal reflux disease, unspecified whether esophagitis present  -     [facility] - Dollene Primrose, MD, Gastroenterology ([facility] & [facility]), [place]  It is the patient's responsibility to call to schedule the referral/set up the appointment. We discussed this, and the patient was given the information on paper along with their AVS to contact the provider for the referral; the provider information was highlighted/circled for  convenience.  Refill for zofran sent for patient  3. Dysphagia, unspecified type  -     [facility] - Dollene Primrose, MD, Gastroenterology ([facility] & [facility]), [place]  Gave referral as patient reports despite protonix she is still having swallowing issues, would recommend EGD and is also due for colon cancer screening as well   It is the patient's responsibility to call to schedule the referral/set up the appointment. We discussed this, and the patient was given the information on paper along with their AVS to contact the provider for the referral; the provider information was highlighted/circled for convenience.  4. Screening for malignant neoplasm of colon  -     Fecal DNA Colorectal cancer screening (Cologuard)   It is the patient's responsibility to call to schedule the referral/set up the appointment. We discussed this, and the patient was given the information on paper along with their AVS to contact the provider for the referral; the provider information was highlighted/circled for convenience.   Information printed and reviewed  5.  Dermatitis    Medication  fluocinonide (LIDEX) 0.05 % cream [3187]  fluocinonide (LIDEX) 0.05 % cream [[id number]][[id number]]     Order Details  Dose, Route, Frequency: As Directed   Dispense Quantity: 15 g Refills: 0          Sig: Apply topically 2 times daily to thumb    Discussed hand hygiene   Right thumb worse than left thumb      Today I spent 50 minutes preparing to see this patient, reviewing history and tests, face to face time with patient and performing a thorough physical exam, and educating patient(family) on diagnosis, plan of care, and treatment plan.         Care Gaps Addressed  Hep C screen recommended with next blood draw   TDAP vaccine recommended- call insurance to discuss coverage  PAP smear recommended  Colon cancer screening discussed- due for screening  COVID booster recommended  Flu vaccine recommended   Mammo ordered  PHQ UTD 2023        I have reviewed patient's  pertinent medical history, relevant laboratory and imaging studies, and past/future health maintenance. Discussed with the patient the importance of adhering to their current medication regimen as directed. Advised the patient that they should continue to work on eating a healthy balanced diet and staying active by exercising within their personal limits. Orders as listed above. Patient was advised to keep future appointments with their respective specialty care team(s). Patient had the opportunity to ask questions, all of which were answered to the best of my ability and with patient satisfaction. Patient understands and is agreeable with the care plan following today's visit. Patient is to schedule an appointment for any new or worsening symptoms. Go to ER for significant shortness of breath, chest pain, or uncontrolled pain or fever.     I discussed with patient the risk and benefits of any medications that were prescribed today. I verified that the patient understands their medications, labs, and/or procedures. The patient is doing well with current medication regimen and does not have any barriers to adherence. The patient's self-management abilities are good.     Return in about 4 months (around 11/11/2021) for Physical Exam and Fasting Labs.    An electronic signature was used to authenticate this note.    --Verna Czechhristine T Tylin Force, APRN - CNP on 07/12/2021 at 9:59 AM

## 2021-07-12 NOTE — Addendum Note (Signed)
Addended by: Domingo Madeira on: 07/12/2021 10:10 AM     Modules accepted: Level of Service

## 2021-09-21 ENCOUNTER — Encounter: Payer: PRIVATE HEALTH INSURANCE | Attending: Family | Primary: Family

## 2021-09-21 NOTE — Patient Instructions (Signed)
GENERAL OFFICE POLICIES      Telephone Calls: Messages will be answered within 1-2 business days, unless the provider is out of the office.  If it is urgent a covering provider will answer. (this does not include Medication refills).    MyChart:  We recommend all patients sign up for MyChart.  Through this portal you can see your lab results, request refills, schedule appointments, pay your bill and send messages to the office.   MyChart messages will be answered within 1-2 business days unless the provider is out of the office.  For urgent matters, please call the office.  Appointments:  All appointments must be scheduled.  We ask all patients to schedule their next follow up appointment before they leave the office to make sure you will be able to be seen before you run out of medications.  24 hours notice is required to cancel or reschedule an appointment to avoid being marked as a no show.  You may be dismissed from the practice after 3 no shows.    LATE for Appointment: If you are 15 or more minutes late for your appointment, you may be asked to reschedule.  MA/LAB APPTS: Must be scheduled, cannot accept walk in lab visits.  We only draw labs for patients established in our office.  We only do injections for medications ordered by our office.  Acute Sick Visits:  Nothing other than acute complaint will be addressed at this visit.  TRADITIONAL MEDICARE  DOES NOT COVER PHYSICALS  MEDICARE WELLNESS VISITS: These are NOT physicals but the free annual visit offered by Medicare to discuss wellness issues. Medication refills, checkups, etc. will not be addressed during this visit.  Medication Refills: Refills are handled electronically so please contact your pharmacy for medication refills even if current refills have been exhausted. If you are on a controlled medication you will be referred to a specialist (pain specialist, psychiatry, etc).   Forms: There is a $35 fee to fill out FMLA/Disability paperwork, payable  at time of pick up. Instead of the fee, you can choose to have the paperwork filled out during a separate office visit that is for filling out the paperwork only.  Medication Samples:  This office does not carry medication samples.  If you need assistance in getting your medications, then please let the medical assistant know so they can help you sign up for a drug assistance program that can help get medications at a reduced cost or even free (if you qualify).  Workman's Comp Claims: We do not handle workman's comp cases or claims. You will need to go to an urgent care to be seen or to whomever your employer uses.  General - Any abusive/rude behavior toward staff/providers may be cause for dismissal.    You may receive a survey regarding the care you received during your visit.  Your input is valuable to us.  We encourage you to complete and return your survey.  We hope you will choose us in the future for your healthcare needs.

## 2021-09-24 NOTE — Progress Notes (Signed)
ERROR - NO SHOW

## 2021-10-24 ENCOUNTER — Encounter: Payer: PRIVATE HEALTH INSURANCE | Attending: Family | Primary: Family

## 2021-11-01 ENCOUNTER — Ambulatory Visit: Admit: 2021-11-01 | Discharge: 2021-11-01 | Payer: PRIVATE HEALTH INSURANCE | Primary: Family

## 2021-11-01 DIAGNOSIS — K219 Gastro-esophageal reflux disease without esophagitis: Secondary | ICD-10-CM

## 2021-11-01 MED ORDER — SUCRALFATE 1 G PO TABS
1 GM | ORAL_TABLET | Freq: Three times a day (TID) | ORAL | 3 refills | Status: AC
Start: 2021-11-01 — End: ?

## 2021-11-01 MED ORDER — PANTOPRAZOLE SODIUM 40 MG PO TBEC
40 MG | ORAL_TABLET | Freq: Every day | ORAL | 3 refills | Status: AC
Start: 2021-11-01 — End: 2022-10-27

## 2021-11-01 MED ORDER — OXYMETAZOLINE HCL 0.05 % NA SOLN
0.05 % | Freq: Two times a day (BID) | NASAL | 3 refills | Status: AC | PRN
Start: 2021-11-01 — End: 2022-11-01

## 2021-11-01 MED ORDER — ESCITALOPRAM OXALATE 20 MG PO TABS
20 MG | ORAL_TABLET | Freq: Every day | ORAL | 1 refills | Status: AC
Start: 2021-11-01 — End: ?

## 2021-11-01 MED ORDER — ESCITALOPRAM OXALATE 10 MG PO TABS
10 MG | ORAL_TABLET | Freq: Every day | ORAL | 1 refills | Status: DC
Start: 2021-11-01 — End: 2021-11-01

## 2021-11-01 MED ORDER — SALINE NASAL SPRAY 0.65 % NA SOLN
0.65 | NASAL | 3 refills | Status: AC | PRN
Start: 2021-11-01 — End: ?

## 2021-11-01 MED ORDER — CETIRIZINE HCL 10 MG PO TABS
10 MG | ORAL_TABLET | Freq: Every day | ORAL | 1 refills | Status: AC
Start: 2021-11-01 — End: ?

## 2021-11-01 MED ORDER — FLUOCINONIDE 0.05 % EX CREA
0.05 % | CUTANEOUS | 0 refills | Status: DC
Start: 2021-11-01 — End: 2021-11-22

## 2021-11-01 NOTE — Telephone Encounter (Signed)
Pharmacy called and informed.  AM

## 2021-11-01 NOTE — Telephone Encounter (Signed)
20 mg.  Cancel 10 mg.    --Rosalie Doctor, APRN - CNP

## 2021-11-01 NOTE — Patient Instructions (Addendum)
GENERAL OFFICE POLICIES      Telephone Calls: Messages will be answered within 1-2 business days, unless the provider is out of the office.  If it is urgent a covering provider will answer. (this does not include Medication refills).    MyChart:  We recommend all patients sign up for MyChart.  Through this portal you can see your lab results, request refills, schedule appointments, pay your bill and send messages to the office.   MyChart messages will be answered within 1-2 business days unless the provider is out of the office.  For urgent matters, please call the office.  Appointments:  All appointments must be scheduled.  We ask all patients to schedule their next follow up appointment before they leave the office to make sure you will be able to be seen before you run out of medications.  24 hours notice is required to cancel or reschedule an appointment to avoid being marked as a no show.  You may be dismissed from the practice after 3 no shows.    LATE for Appointment: If you are 15 or more minutes late for your appointment, you may be asked to reschedule.  MA/LAB APPTS: Must be scheduled, cannot accept walk in lab visits.  We only draw labs for patients established in our office.  We only do injections for medications ordered by our office.  Acute Sick Visits:  Nothing other than acute complaint will be addressed at this visit.  TRADITIONAL MEDICARE  DOES NOT COVER PHYSICALS  MEDICARE WELLNESS VISITS: These are NOT physicals but the free annual visit offered by Medicare to discuss wellness issues. Medication refills, checkups, etc. will not be addressed during this visit.  Medication Refills: Refills are handled electronically so please contact your pharmacy for medication refills even if current refills have been exhausted. If you are on a controlled medication you will be referred to a specialist (pain specialist, psychiatry, etc).   Forms: There is a $35 fee to fill out FMLA/Disability paperwork, payable  at time of pick up. Instead of the fee, you can choose to have the paperwork filled out during a separate office visit that is for filling out the paperwork only.  Medication Samples:  This office does not carry medication samples.  If you need assistance in getting your medications, then please let the medical assistant know so they can help you sign up for a drug assistance program that can help get medications at a reduced cost or even free (if you qualify).  Workman's Comp Claims: We do not handle workman's comp cases or claims. You will need to go to an urgent care to be seen or to whomever your employer uses.  General - Any abusive/rude behavior toward staff/providers may be cause for dismissal.    You may receive a survey regarding the care you received during your visit.  Your input is valuable to us.  We encourage you to complete and return your survey.  We hope you will choose us in the future for your healthcare needs.
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
## 2021-11-01 NOTE — Telephone Encounter (Signed)
[facility] [place]    RE: LEXAPRO - THEY HAVE 2 SCRIPTS -ONE FOR 10 MG AND ONE FOR 20 MG --- WHICH IS IT OR BOTH?

## 2021-11-08 ENCOUNTER — Telehealth

## 2021-11-08 MED ORDER — OXYMETAZOLINE HCL 0.05 % NA SOLN
0.05 % | Freq: Two times a day (BID) | NASAL | 0 refills | Status: AC | PRN
Start: 2021-11-08 — End: ?

## 2021-11-08 NOTE — Telephone Encounter (Signed)
Resent to pharmacy.    --Alcide Evener, APRN - CNP

## 2021-11-08 NOTE — Telephone Encounter (Signed)
Pharmacy needs clarification on directions for the medication OXYMETAZOLEIN NASAL SPRAY. Please give them a call back.

## 2021-11-08 NOTE — Telephone Encounter (Signed)
She has no showed her last 3 appointments that were supposed to be with me. Looks like she saw Franky Macho, so forwarding to him.

## 2021-11-22 ENCOUNTER — Encounter

## 2021-11-22 MED ORDER — FLUOCINONIDE 0.05 % EX CREA
0.05 % | CUTANEOUS | 0 refills | Status: AC
Start: 2021-11-22 — End: ?

## 2021-11-22 NOTE — Telephone Encounter (Signed)
Last appt 11/01/21  No follow up scheduled

## 2021-12-05 NOTE — Patient Instructions (Signed)
GENERAL OFFICE POLICIES      Telephone Calls: Messages will be answered within 1-2 business days, unless the provider is out of the office.  If it is urgent a covering provider will answer. (this does not include Medication refills).    MyChart:  We recommend all patients sign up for MyChart.  Through this portal you can see your lab results, request refills, schedule appointments, pay your bill and send messages to the office.   MyChart messages will be answered within 1-2 business days unless the provider is out of the office.  For urgent matters, please call the office.  Appointments:  All appointments must be scheduled.  We ask all patients to schedule their next follow up appointment before they leave the office to make sure you will be able to be seen before you run out of medications.  24 hours notice is required to cancel or reschedule an appointment to avoid being marked as a no show.  You may be dismissed from the practice after 3 no shows.    LATE for Appointment: If you are 15 or more minutes late for your appointment, you may be asked to reschedule.  MA/LAB APPTS: Must be scheduled, cannot accept walk in lab visits.  We only draw labs for patients established in our office.  We only do injections for medications ordered by our office.  Acute Sick Visits:  Nothing other than acute complaint will be addressed at this visit.  TRADITIONAL MEDICARE  DOES NOT COVER PHYSICALS  MEDICARE WELLNESS VISITS: These are NOT physicals but the free annual visit offered by Medicare to discuss wellness issues. Medication refills, checkups, etc. will not be addressed during this visit.  Medication Refills: Refills are handled electronically so please contact your pharmacy for medication refills even if current refills have been exhausted. If you are on a controlled medication you will be referred to a specialist (pain specialist, psychiatry, etc).   Forms: There is a $35 fee to fill out FMLA/Disability paperwork, payable  at time of pick up. Instead of the fee, you can choose to have the paperwork filled out during a separate office visit that is for filling out the paperwork only.  Medication Samples:  This office does not carry medication samples.  If you need assistance in getting your medications, then please let the medical assistant know so they can help you sign up for a drug assistance program that can help get medications at a reduced cost or even free (if you qualify).  Workman's Comp Claims: We do not handle workman's comp cases or claims. You will need to go to an urgent care to be seen or to whomever your employer uses.  General - Any abusive/rude behavior toward staff/providers may be cause for dismissal.    You may receive a survey regarding the care you received during your visit.  Your input is valuable to us.  We encourage you to complete and return your survey.  We hope you will choose us in the future for your healthcare needs.

## 2021-12-06 ENCOUNTER — Encounter: Payer: PRIVATE HEALTH INSURANCE | Primary: Family

## 2021-12-07 NOTE — Progress Notes (Signed)
Erroneous

## 2021-12-12 ENCOUNTER — Ambulatory Visit: Admit: 2021-12-12 | Discharge: 2021-12-12 | Payer: PRIVATE HEALTH INSURANCE | Attending: Family | Primary: Family

## 2021-12-12 DIAGNOSIS — K219 Gastro-esophageal reflux disease without esophagitis: Secondary | ICD-10-CM

## 2021-12-12 NOTE — Patient Instructions (Signed)
GENERAL OFFICE POLICIES      Telephone Calls: Messages will be answered within 1-2 business days, unless the provider is out of the office.  If it is urgent a covering provider will answer. (this does not include Medication refills).    MyChart:  We recommend all patients sign up for MyChart.  Through this portal you can see your lab results, request refills, schedule appointments, pay your bill and send messages to the office.   MyChart messages will be answered within 1-2 business days unless the provider is out of the office.  For urgent matters, please call the office.  Appointments:  All appointments must be scheduled.  We ask all patients to schedule their next follow up appointment before they leave the office to make sure you will be able to be seen before you run out of medications.  24 hours notice is required to cancel or reschedule an appointment to avoid being marked as a no show.  You may be dismissed from the practice after 3 no shows.    LATE for Appointment: If you are 15 or more minutes late for your appointment, you may be asked to reschedule.  MA/LAB APPTS: Must be scheduled, cannot accept walk in lab visits.  We only draw labs for patients established in our office.  We only do injections for medications ordered by our office.  Acute Sick Visits:  Nothing other than acute complaint will be addressed at this visit.  TRADITIONAL MEDICARE  DOES NOT COVER PHYSICALS  MEDICARE WELLNESS VISITS: These are NOT physicals but the free annual visit offered by Medicare to discuss wellness issues. Medication refills, checkups, etc. will not be addressed during this visit.  Medication Refills: Refills are handled electronically so please contact your pharmacy for medication refills even if current refills have been exhausted. If you are on a controlled medication you will be referred to a specialist (pain specialist, psychiatry, etc).   Forms: There is a $35 fee to fill out FMLA/Disability paperwork, payable  at time of pick up. Instead of the fee, you can choose to have the paperwork filled out during a separate office visit that is for filling out the paperwork only.  Medication Samples:  This office does not carry medication samples.  If you need assistance in getting your medications, then please let the medical assistant know so they can help you sign up for a drug assistance program that can help get medications at a reduced cost or even free (if you qualify).  Workman's Comp Claims: We do not handle workman's comp cases or claims. You will need to go to an urgent care to be seen or to whomever your employer uses.  General - Any abusive/rude behavior toward staff/providers may be cause for dismissal.    You may receive a survey regarding the care you received during your visit.  Your input is valuable to us.  We encourage you to complete and return your survey.  We hope you will choose us in the future for your healthcare needs.
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
## 2021-12-12 NOTE — Consults (Signed)
Session ID: [id number]  Language: Nepali  Interpreter ID: [id number]  Interpreter Name: Leeann Must

## 2021-12-20 ENCOUNTER — Ambulatory Visit: Admit: 2021-12-20 | Discharge: 2021-12-20 | Payer: PRIVATE HEALTH INSURANCE | Attending: Hand Surgery | Primary: Family

## 2021-12-20 DIAGNOSIS — G5603 Carpal tunnel syndrome, bilateral upper limbs: Secondary | ICD-10-CM

## 2021-12-20 NOTE — Progress Notes (Signed)
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  Symptoms have been present for 2-3 months only.  The patient also frequently notices pain in the hands, wrists and arms, as well as weakness of grip, morning stiffness and swelling as well as difficulty performing functions which require fine touch.  Symptoms are frequently worse at night and can disturb sleep.  The problem appears to recently have become worse. Conservative treatment has not been prescribed. There is no history of wrist fracture.  There is no history and/or family history of diabetes.  There is no history of thyroid disease.  There is no family history of carpal tunnel syndrome. The pain assessment has been reviewed and is correct as stated.    The patient's social history, past medical history, family history, medications, allergies and review of systems, entered 12/20/21, have been reviewed, and dated and are recorded in the chart.    On examination the patient is Height: 5' (152.4 cm) tall and weighs Weight - Scale: 144 lb (65.3 kg). Respirations are 18 per minute.  The patient is well nourished, is oriented to time and place, demonstrates flat affect and averted gaze, as well as normal gait and station.   Cervical range of motion is limited, with poor effort and does not produce pain or paresthesias in either upper extremities.  There is mild soft tissue swelling involving the volar aspect of the bilateral wrists.  There is moderate right thenar muscle atrophy.  The Tinel sign is positive over the median nerve at the  carpal tunnel bilaterally and negative over the ulnar nerve at the elbow bilaterally.  The Phalen test is positive on the right at 0 seconds.  There is hypalgesia, with associated dysesthesia, on sensory testing  over the median nerve distribution.  Two point discrimination is normal at the tip of the middle finger.  Range of motion of the fingers, thumbs and wrists is normal.  Skin is intact without lesions.  Distal circulation is normal.  All joints are stable.  Fine motor coordination and gross muscle strength are normal. Deep tendon reflexes are brisk and present bilaterally. There are no subcutaneous masses or enlarged epitrochlear lymph nodes.    I have personally reviewed and interpreted all previous external imaging studies, laboratory tests(CMP, Lipid Panel, CBC+diff), diagnostic proceedures and medical encounters pertinent to this patient's visit today.    Impression:     Carpal tunnel syndrome, bilateral, with early clinical evidence of nerve damage.    Utilizing the services of a certified language translator, the nature of this medical problem is fully discussed with the patient, including all treatment options. All questions are answered.     She is fitted with a hand/wrist braces and is instructed regarding their care and nocturnal use for the next 6 weeks.  If symptoms persist or worsen, the patient is advised to return.         Procedures    Hely Weber Titan Wrist Short Brace     Patient was prescribed a Hely Weber Leisure centre manager.   The bilateral wrist will require stabilization / immobilization from this semi-rigid / rigid orthosis to improve their function.  The orthosis will assist in protecting the affected area, provide functional support and facilitate healing.  The patient was educated and fit by a Designer, fashion/clothing with expert knowledge and specialization in brace application while under the direct supervision of the treating physician.  Verbal and written instructions for the use of and application of this item were provided.   They were instructed to contact the office immediately should the brace result in increased pain, decreased sensation, increased swelling or worsening of the  condition.

## 2022-02-08 ENCOUNTER — Encounter: Payer: PRIVATE HEALTH INSURANCE | Attending: Family | Primary: Family

## 2022-02-08 NOTE — Progress Notes (Signed)
This encounter was created in error - please disregard.

## 2022-02-08 NOTE — Progress Notes (Deleted)
Deborah Riley (DOB:  Jan 22, 1976) is a 46 y.o. female,Established patient, here for evaluation of the following chief complaint(s):  No chief complaint on file.         ASSESSMENT/PLAN:  {There are no diagnoses linked to this encounter. (Refresh or delete this SmartLink)}    No follow-ups on file.         Subjective   SUBJECTIVE/OBJECTIVE:    HPI    Review of Systems       Objective     There were no vitals filed for this visit.    Physical Exam       I have reviewed patient's pertinent medical history, relevant laboratory and imaging studies, and past/future health maintenance. Discussed with the patient the importance of adhering to their current medication regimen as directed. Advised the patient that they should continue to work on eating a healthy balanced diet and staying active by exercising within their personal limits. Orders as listed above. Patient was advised to keep future appointments with their respective specialty care team(s). Patient had the opportunity to ask questions, all of which were answered to the best of my ability and with patient satisfaction. Patient understands and is agreeable with the care plan following today's visit. Patient is to schedule an appointment for any new or worsening symptoms. Go to ER for significant shortness of breath, chest pain, or uncontrolled pain or fever.     I discussed with patient the risk and benefits of any medications that were prescribed today. I verified that the patient understands their medications, labs, and/or procedures. The patient is doing well with current medication regimen and does not have any barriers to adherence. The patient's self-management abilities are good.       An electronic signature was used to authenticate this note.    --Enzo Montgomery, APRN - CNP

## 2022-02-26 NOTE — Telephone Encounter (Signed)
Patients child was sent to me through the [facility] to try and be scheduled in the next three days after the Triage call with Nurse. They needed to be seen for Suicidal Ideation per the ECC. After talking to our Primaries they recommend son to take the patient to the ER

## 2022-02-26 NOTE — Telephone Encounter (Signed)
Location of patient: [place]    Received call from [place] at [facility] with [employer].    Subjective: Caller states "abd pain ,  sob and depression "     Current Symptoms: abd pain , cp related to acid reflux does see GI , sob and depression and tingling in hands , feeling depressed worsening and sometimes feels like may hurt himself no plan currently , does have family support  has a job , daughter  helps him , eating and drinking ok, gets sob and cp at night , not every night , is on depression medication , cp comes and goes , no weight loss has been seen numerous times for cp ,gerd     Onset: abd pain 9 years, sob 4 years , cp 4 years ,     Associated Symptoms: NA    Pain Severity: 2/10    Temperature: none       What has been tried: n er visit     LMP:  n/a  Pregnant: NA    Recommended disposition: See PCP within 3 Days    Care advice provided, patient verbalizes understanding; denies any other questions or concerns; instructed to call back for any new or worsening symptoms.    Patient/Caller agrees with recommended disposition; Probation officer provided warm transfer to [employer] at [employer] for appointment scheduling    Attention Provider:  Thank you for allowing me to participate in the care of your patient.  The patient was connected to triage in response to information provided to the ECC/PSC.  Please do not respond through this encounter as the response is not directed to a shared pool.         Reason for Disposition   Depression is worsening (e.g.,sleeping poorly, less able to do activities of daily living)    Protocols used: Depression-ADULT-[place]

## 2022-02-27 NOTE — Telephone Encounter (Signed)
This patient has been dismissed from our office for No Shows. She needs to go to ER.

## 2022-08-20 ENCOUNTER — Ambulatory Visit: Payer: PRIVATE HEALTH INSURANCE

## 2022-08-21 ENCOUNTER — Ambulatory Visit: Admit: 2022-08-21 | Discharge: 2022-08-21 | Payer: PRIVATE HEALTH INSURANCE

## 2022-08-21 DIAGNOSIS — F331 Major depressive disorder, recurrent, moderate: Secondary | ICD-10-CM

## 2022-08-21 LAB — DIFFERENTIAL
Basophils Absolute: 34 /uL (ref 0–200)
Basophils Relative: 0.6 % (ref 0.0–1.0)
Eosinophils Absolute: 120 /uL (ref 15–500)
Eosinophils Relative: 2.1 % (ref 0.0–8.0)
Lymphocytes Absolute: 1710 /uL (ref 850–3900)
Lymphocytes Relative: 30 % (ref 15.0–45.0)
Monocytes Absolute: 456 /uL (ref 200–950)
Monocytes Relative: 8 % (ref 0.0–12.0)
Neutrophils Absolute: 3380 /uL (ref 1500–7800)
Neutrophils Relative: 59.3 % (ref 40.0–80.0)
nRBC: 0 /100 WBC (ref 0–0)

## 2022-08-21 LAB — CBC
Hematocrit: 39.5 % (ref 35.0–45.0)
Hemoglobin: 13.3 g/dL (ref 11.7–15.5)
MCH: 29.6 pg (ref 27.0–33.0)
MCHC: 33.7 g/dL (ref 32.0–36.0)
MCV: 88 fL (ref 80.0–100.0)
MPV: 10.4 fL (ref 7.5–11.5)
Platelets: 215 10*3/uL (ref 140–400)
RBC: 4.49 10*6/uL (ref 3.80–5.10)
RDW: 13 % (ref 11.0–15.0)
WBC: 5.7 10*3/uL (ref 3.8–10.8)

## 2022-08-21 LAB — VITAMIN B12: Vitamin B-12: 310 pg/mL (ref 180–914)

## 2022-08-21 LAB — COMPREHENSIVE METABOLIC PANEL (SERUM), FASTING
ALT: 17 U/L (ref 7–52)
AST: 19 U/L (ref 13–39)
Albumin: 3.6 g/dL (ref 3.5–5.7)
Alkaline Phosphatase: 62 U/L (ref 36–125)
Anion Gap: 8 mmol/L (ref 3–16)
BUN: 16 mg/dL (ref 7–25)
CO2: 29 mmol/L (ref 21–33)
Calcium: 9.4 mg/dL (ref 8.6–10.3)
Chloride: 102 mmol/L (ref 98–110)
Creatinine: 0.54 mg/dL — ABNORMAL LOW (ref 0.60–1.30)
EGFR: >90
Glucose, Fasting: 93 mg/dL (ref 70–100)
Osmolality, Calculated: 289 mOsm/kg (ref 278–305)
Potassium: 4.2 mmol/L (ref 3.5–5.3)
Sodium: 139 mmol/L (ref 133–146)
Total Bilirubin: 0.8 mg/dL (ref 0.0–1.5)
Total Protein: 7.3 g/dL (ref 6.4–8.9)

## 2022-08-21 LAB — LIPID PANEL
Cholesterol, Total: 221 mg/dL — ABNORMAL HIGH (ref 0–200)
HDL: 37 mg/dL — ABNORMAL LOW (ref 60–92)
LDL Cholesterol: 147 mg/dL
Non-HDL Cholesterol, Calculated: 184 mg/dL — ABNORMAL HIGH (ref 0–129)
Triglycerides: 187 mg/dL — ABNORMAL HIGH (ref 10–149)

## 2022-08-21 LAB — THYROID PEROXIDASE ANTIBODY: Thyroid Peroxidase Ab: 0.4 IU/mL (ref 0.00–9.00)

## 2022-08-21 LAB — VITAMIN D 25 HYDROXY: Vit D, 25-Hydroxy: 8 ng/mL — ABNORMAL LOW (ref 30.0–100.0)

## 2022-08-21 LAB — T4, FREE: Free T4: 0.81 ng/dL (ref 0.61–1.76)

## 2022-08-21 LAB — THYROID FUNCTION CASCADE: TSH: 9.19 u[IU]/mL — ABNORMAL HIGH (ref 0.45–4.12)

## 2022-08-21 MED ORDER — sodium chloride (SALINE MIST) 0.65 % nasal spray
0.65 | NASAL | 0 refills | Status: AC | PRN
Start: 2022-08-21 — End: ?

## 2022-08-21 MED ORDER — omeprazole (PRILOSEC) 40 MG capsule
40 | ORAL_CAPSULE | Freq: Every morning | ORAL | 0 refills | Status: AC
Start: 2022-08-21 — End: ?

## 2022-08-21 MED ORDER — escitalopram oxalate (LEXAPRO) 20 MG tablet
20 | ORAL_TABLET | Freq: Every day | ORAL | 1 refills | Status: AC
Start: 2022-08-21 — End: 2022-10-17

## 2022-08-21 NOTE — Patient Instructions (Addendum)
Will call with lab results tomorrow  Call to schedule appointment for Thyroid Ultrasound. No appointment needed for chest xray.   Call to schedule appointment with GI for abdominal pain.   Stop taking advil or ibuprofen as this can cause stomach ulcers

## 2022-08-21 NOTE — Progress Notes (Signed)
[facility]ARE AT [place] [address]  Subjective   Name:  Debra Davidson Date of Birth: Jul 24, 1975 (47 y.o.)   MRN: [id number]    Date of Service:  08/21/2022      Chief Complaint   Patient presents with    Establish Care    Depression     History of Present Illness:  Debra Davidson is a(n) 47 y.o. female here today for the following:   HPI      Patient presents today with her son to establish care. They speak Nepalese so interpreting services were used to conduct the visit with consent of the patient.   History provided primarily by the patient's son. He reports that she hasn't seen a PCP in a while and has been out of medications for about the past 2 months. She was on medication for depression, reflux, and possible GI ulcer but unclear if she has had previous EGD or not. He reports that she has seemed very unwell the past 2 months. She is having abdominal pain, primarily epigastric, been very depressed, fatigued, forgetful, slow moving, and intolerant to cold. She has also been having chest pain when she takes deep breath and shortness of breath when lying down. Additionally, about 2 months ago, she noticed a new lump on her neck and some mild neck swelling. Son denies any family hx of thyroid disease or thyroid cancer.   He reports that she has struggled with depression for years, her husband passed away several years ago which was about the time that he first noticed her depression. Other close members of the family have also passed away over the years which has worsened symptoms for her. She had been on 20mg  lexapro for a while up until she ran out a few months ago. He reports that she was doing a lot better on the lexapro and that she would like to restart it.   She had been established with GI a few years ago and would like to reestablish with UC.   She also acknowledges that she has occasional nosebleeds.       Current Outpatient Medications   Medication Sig  Dispense Refill    cyclobenzaprine (FLEXERIL) 10 MG tablet Take 1 tablet (10 mg total) by mouth 3 times a day as needed for Muscle spasms. 10 tablet 0    escitalopram oxalate (LEXAPRO) 20 MG tablet Take 1 tablet (20 mg total) by mouth daily. 30 tablet 1    ibuprofen (MOTRIN) 800 MG tablet Take 1 tablet (800 mg total) by mouth every 8 hours as needed for Pain. 30 tablet 0    lidocaine (LIDODERM) 5 % Place 1 patch onto the skin daily. Apply patch for 12 hours and then remove patch and leave off for 12 hours. 30 patch 0    omeprazole (PRILOSEC) 40 MG capsule Take 1 capsule (40 mg total) by mouth every morning before breakfast. 30 capsule 0    sodium chloride (SALINE MIST) 0.65 % nasal spray Use 1 spray into each nostril if needed for Congestion. 15 mL 0     No current facility-administered medications for this visit.      Review of Systems   Constitutional:  Positive for fatigue. Negative for chills, fever, weight gain and weight loss.   HENT:  Positive for nosebleeds.    Respiratory:  Positive for shortness of breath. Negative for cough and wheezing.    Cardiovascular:  Positive  for chest pain (with deep breathing). Negative for palpitations.   Gastrointestinal:  Positive for abdominal pain and heartburn. Negative for blood in stool, diarrhea, nausea and vomiting.   Neurological:  Positive for numbness (BUE/BLE). Negative for speech difficulty.   Psychiatric/Behavioral:  Positive for depression. Negative for self-injury, sleep disturbance and suicidal ideas. The patient is not nervous/anxious.            Objective   Vitals:    08/21/22 1039   BP: 118/80   Pulse: 77   Temp: 97.1 F (36.2 C)   SpO2: 98%   Height: 5' 1 (1.549 m)   Weight: 148 lb 1.6 oz (67.2 kg)   BMI (Calculated): 28     Body mass index is 27.98 kg/m.        Physical Exam  Vitals reviewed.   Constitutional:       General: She is not in acute distress.     Appearance: Normal appearance.      Comments: History provided primarily by son  Patient  participated minimally in conversation  Son states she has difficulty following conversation with interpreter phone   HENT:      Head: Normocephalic.      Right Ear: Tympanic membrane normal.      Left Ear: Tympanic membrane normal.   Eyes:      Pupils: Pupils are equal, round, and reactive to light.   Neck:      Thyroid: Thyromegaly present. No thyroid tenderness.   Cardiovascular:      Rate and Rhythm: Normal rate and regular rhythm.      Pulses: Normal pulses.      Heart sounds: Normal heart sounds.   Pulmonary:      Breath sounds: Normal breath sounds. No wheezing.   Abdominal:      General: Bowel sounds are normal.      Palpations: Abdomen is soft.      Tenderness: There is no abdominal tenderness.   Musculoskeletal:      Right lower leg: No edema.      Left lower leg: No edema.   Skin:     General: Skin is warm and dry.      Findings: No lesion or rash.   Neurological:      General: No focal deficit present.      Mental Status: She is alert.   Psychiatric:         Mood and Affect: Mood normal.                  Assessment & Plan     Debra Davidson was seen today for establish care and depression.    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    History of gastrointestinal ulcer  -     GI Office Visit/Consult  -     omeprazole (PRILOSEC) 40 MG capsule; Take 1 capsule (40 mg total) by mouth every morning before breakfast.    Frequent nosebleeds  -     sodium chloride (SALINE MIST) 0.65 % nasal spray; Use 1 spray into each nostril if needed for Congestion.    Chest pain on breathing  -     Comprehensive Metabolic Panel, Serum; Future  -  CBC; Future  -     Differential; Future  -     Thyroid Function Cascade; Future  -     US Thyroid-Neck-Head; Future  -     Lipid Profile; Future  -      PCN ECG 12-Lead (Non-MUSE)  -     X-ray Chest PA and Lateral; Future  -     Cancel: Vitamin B12  -     Vitamin D 25 hydroxy; Future    Shortness of breath  -     PCN ECG 12-Lead (Non-MUSE)  -     X-ray Chest PA and Lateral; Future    Fatigue, unspecified type  -     Thyroid Function Cascade; Future  -     US Thyroid-Neck-Head; Future  -     Vitamin D 25 hydroxy; Future    Cold intolerance  -     Thyroid Function Cascade; Future  -     US Thyroid-Neck-Head; Future    Localized swelling, mass or lump of neck  -     Thyroid Function Cascade; Future  -     US Thyroid-Neck-Head; Future    Numbness and tingling in both hands  -     Cancel: Vitamin B12  -     Vitamin B12; Future    Numbness and tingling of both feet  -     Cancel: Vitamin B12  -     Vitamin B12; Future    Other orders  -     escitalopram oxalate (LEXAPRO) 20 MG tablet; Take 1 tablet (20 mg total) by mouth daily.    Restarting lexapro for depression.   EKG reviewed- Sinus Bradycardia HR in 50s. Chest Xray ordered to eval SOB- she will complete at [facility].   Thyroid dysfunction high on differential list with cold intolerance, fatigue, bradycardia, and lethargy, and neck swelling. Checking thyroid cascade and ultrasound.   Checking B12 to eval as possible cause of tingling in hands and feet.   GI referral placed.   Would like to see her back in 1 month or sooner if needed based on lab results.          Return in about 1 month (around 09/21/2022), or if symptoms worsen or fail to improve, for depression .         Richardean Canal, CNP

## 2022-08-22 MED ORDER — cyanocobalamin (VITAMIN B-12) 1000 MCG tablet
1000 | ORAL_TABLET | Freq: Every day | ORAL | 0 refills | Status: AC
Start: 2022-08-22 — End: 2022-10-17

## 2022-08-22 MED ORDER — levothyroxine (SYNTHROID) 25 MCG tablet
25 | ORAL_TABLET | Freq: Every morning | ORAL | 1 refills | Status: AC
Start: 2022-08-22 — End: 2022-11-11

## 2022-08-22 MED ORDER — ergocalciferol (ERGOCALCIFEROL) 1,250 mcg (50,000 unit) capsule
1250 | ORAL_CAPSULE | ORAL | 0 refills | Status: AC
Start: 2022-08-22 — End: 2022-11-14

## 2022-10-17 ENCOUNTER — Ambulatory Visit: Admit: 2022-10-17 | Discharge: 2022-10-17 | Payer: PRIVATE HEALTH INSURANCE

## 2022-10-17 ENCOUNTER — Other Ambulatory Visit: Admit: 2022-10-18 | Payer: PRIVATE HEALTH INSURANCE

## 2022-10-17 DIAGNOSIS — R69 Illness, unspecified: Secondary | ICD-10-CM

## 2022-10-17 DIAGNOSIS — Z0000 Encounter for general adult medical examination without abnormal findings: Secondary | ICD-10-CM

## 2022-10-17 DIAGNOSIS — N644 Mastodynia: Secondary | ICD-10-CM

## 2022-10-17 MED ORDER — escitalopram oxalate (LEXAPRO) 20 MG tablet
20 | ORAL_TABLET | Freq: Every day | ORAL | 1 refills | Status: AC
Start: 2022-10-17 — End: ?

## 2022-10-17 MED ORDER — nystatin-triamcinolone (MYCOLOG) ointment
100000-0.1 | Freq: Four times a day (QID) | TOPICAL | 0 refills
Start: 2022-10-17 — End: 2022-11-11

## 2022-10-17 NOTE — Patient Instructions (Addendum)
Call [phone] appt for thyroid ultrasound. Can get the xray of the chest on the same day since you do not need an appt.     Call [phone] [phone] mammograms. These will also be done at the hospital     Testing will be completed at - [address], [place] [address]

## 2022-10-17 NOTE — Progress Notes (Signed)
[facility] HE[facility] PRIMARY [facility] AT [facility]  [address] [place] [address]  Subjective   Name:  Debra Davidson Date of Birth: 08/25/1975 (47 y.o.)   MRN: [id number]    Date of Service:  10/17/2022      Chief Complaint   Patient presents with    Breast Mass     History of Present Illness:  Debra Davidson is a(n) 47 y.o. female here today for the following:   HPI    Here today for R Breast Mass. Has sharp, burning sensation when she touches or bumps against her breast.   She reports noticing the mass and pain several months ago.  It has not changed.  She used to get mastitis when she was breast feeding her child.   She has never had a mammogram.     She continues to have anxiety and palpitations and has difficulty breathing.   She did not get the chest xray or the thyroid ultrasound that I ordered when she was here last time.     She reports having a rash and itchiness to her bilateral thumb joint region.  States that it has been there for years.  Has not put anything on them.  It has gotten painful enough to where it is limiting what she can do at work and home.     Still having nausea and abdominal pain. Has GI appt scheduled for next week.         Current Outpatient Medications   Medication Sig Dispense Refill    ergocalciferol (ERGOCALCIFEROL) 1,250 mcg (50,000 unit) capsule Take 1 capsule (50,000 Units total) by mouth once a week for 84 days. 12 capsule 0    escitalopram oxalate (LEXAPRO) 20 MG tablet Take 1 tablet (20 mg total) by mouth daily. 90 tablet 1    ibuprofen (MOTRIN) 800 MG tablet Take 1 tablet (800 mg total) by mouth every 8 hours as needed for Pain. 30 tablet 0    sodium chloride (SALINE MIST) 0.65 % nasal spray Use 1 spray into each nostril if needed for Congestion. 15 mL 0    levothyroxine (SYNTHROID) 25 MCG tablet Take 1 tablet (25 mcg total) by mouth every morning before breakfast. 30 tablet 1    lidocaine (LIDODERM) 5 % Place 1 patch onto the skin daily. Apply patch for 12 hours and then  remove patch and leave off for 12 hours. 30 patch 0    nystatin-triamcinolone (MYCOLOG) ointment Apply topically 4 times a day. 30 g 0    omeprazole (PRILOSEC) 40 MG capsule Take 1 capsule (40 mg total) by mouth every morning before breakfast. 30 capsule 0     No current facility-administered medications for this visit.      Review of Systems   Constitutional:  Negative for chills and fever.   Respiratory:  Positive for shortness of breath.    Cardiovascular:  Positive for palpitations.   Musculoskeletal:         Right breast mass   Skin:  Positive for rash (bilateral thumbs).           Objective   Vitals:    10/17/22 1001   BP: 130/80   Pulse: 57   Temp: 97.3 F (36.3 C)   SpO2: 99%   Height: 5' 1 (1.549 m)   Weight: 149 lb (67.6 kg)   BMI (Calculated): 28.17     Body mass index is 28.15 kg/m.  Physical Exam  Exam conducted with a chaperone present.   Chest:   Breasts:     Right: Mass (11 o'clock region, mobile, tender) and tenderness present. No swelling, bleeding, inverted nipple, nipple discharge or skin change.      Left: Normal. No inverted nipple.                  Assessment & Plan   Debra Davidson was seen today for breast mass.    Diagnoses and all orders for this visit:    Breast pain, right (Primary)  -     Mammo Diagnostic Right incl CAD; Future  -     Mammo US Breast DIAGNOSTIC Uni incl axilla Ltd RT; Future    Rash  -     nystatin-triamcinolone (MYCOLOG) ointment; Apply topically 4 times a day.    Encounter for screening mammogram for malignant neoplasm of breast  -     Mammography Screening Left incl CAD; Future    Other orders  -     escitalopram oxalate (LEXAPRO) 20 MG tablet; Take 1 tablet (20 mg total) by mouth daily.        Ordered diagnostic mammo and ultrasound of right breast to evaluate mass.  Screening mammogram ordered for the left side as she has never had.   Will trial Mycolog for her rash on her hands 4 times a day.  Advised her to follow-up with me if no improvement in 2  weeks.  Encouraged her to go and get the thyroid ultrasound and chest x-ray to evaluate the mass on her thyroid, and the shortness of breath.  Provided her with address of hospital and phone numbers to call and schedule.  Rechecking thyroid function today to monitor since started levothyroxine 2 months ago.  Would like her to follow-up once she gets these tests completed to discuss results and plan next steps.         Return if symptoms worsen or fail to improve.         Kaleen Mask, CNP    Number and Complexity of Problems Addressed  1 acute, uncomplicated illness or injury  1 undiagnosed new problem with uncertain prognosis    Amount and/or Complexity of Data to be Reviewed and Analyzed  3+ unique tests ordered    Risk of Complications and/or Morbidity or Mortality of Patient Management  Moderate    Time  I spent a total of 30 minutes on the day of the visit.

## 2022-10-18 LAB — THYROID FUNCTION CASCADE: TSH: 12.8 u[IU]/mL — ABNORMAL HIGH (ref 0.45–4.12)

## 2022-10-18 LAB — T4, FREE: Free T4: 0.83 ng/dL (ref 0.61–1.76)

## 2022-10-22 ENCOUNTER — Ambulatory Visit: Admit: 2022-10-22 | Discharge: 2022-10-28 | Payer: PRIVATE HEALTH INSURANCE | Attending: Family

## 2022-10-22 ENCOUNTER — Inpatient Hospital Stay: Admit: 2022-10-22 | Discharge: 2022-10-30 | Payer: PRIVATE HEALTH INSURANCE | Attending: Family

## 2022-10-22 DIAGNOSIS — R195 Other fecal abnormalities: Secondary | ICD-10-CM

## 2022-10-22 DIAGNOSIS — Z1211 Encounter for screening for malignant neoplasm of colon: Secondary | ICD-10-CM

## 2022-10-22 MED ORDER — polyethylene glycol (MIRALAX) 17 gram/dose powder
17 | ORAL | 11 refills | 14.00000 days | Status: AC
Start: 2022-10-22 — End: ?

## 2022-10-22 NOTE — Assessment & Plan Note (Signed)
She has never had a colonoscopy.  I also reviewed her chart in search of a colonoscopy report and did not locate one.  She will be scheduled for a screening colonoscopy.  The risks were reviewed, including but not limited to perforation, bleeding, missed lesions, complications of sedation, and need for surgery. No changes will be made in her medications prior to the procedure. She will be given a split-dose GoLytely prep.

## 2022-10-22 NOTE — Assessment & Plan Note (Signed)
Mrs. Newbanks is a 47 year old female seen today for chronic a66bdominal pain since 2018.  Her pain is epigastric, dull and constant.  Nothing makes it better or worse.  Eating makes no difference.  She takes ibuprofen almost daily for her pain.  She is taking omeprazole 40 mg daily without relief.  She reports she takes it before breakfast.  [facility]  So far she has had an extensive workup with labs, EGD (done in 2020, report unavailable), ultrasound, CT scan abdomen pelvis -all WNL.  She also reports solids and liquids feeling they get stuck in her upper chest.  She reports intermittent vomiting in the morning.  It is unclear how often this is happening.  She will be scheduled for an esophagogastroduodenoscopy (EGD) to rule out esophagitis, gastritis, ulcer or H. pylori.  The risks were reviewed, including but not limited to perforation, bleeding, missed lesions, complications of sedation, and need for surgery. No changes will be made in her medications prior to the procedure.   She is appropriate for any location however prefers to [facility], MAC anesthesia.

## 2022-10-22 NOTE — Assessment & Plan Note (Signed)
Noted for completeness.

## 2022-10-22 NOTE — Assessment & Plan Note (Signed)
She reports having 3-4 bowel movements daily with watery stool.  She takes ibuprofen almost daily.  Will have her complete KUB to evaluate stool burden.  Will also rule out microscopic colitis during colonoscopy.

## 2022-10-22 NOTE — Assessment & Plan Note (Signed)
She reports solid and liquids for likely get stuck in her upper chest.  Will proceed with EGD to rule out esophageal stricture, ring or web.  She may need an esophageal dilation.

## 2022-10-22 NOTE — Progress Notes (Signed)
Gastroenterology New Patient Consultation Note (Nurse Practitioner)     History: Mediocre historian    ID: Patient is a 47 y.o. year old female referred by Jilda Roche, CNP for consultation for chronic abdominal pain. She is Nepali speaking. She is accompanied by family. Her family is translating.    HPI:  [name] reports abdominal pain since 2018. Pain is epigastric. She reports pain is constant and dull.   Nothing makes it better. Eating makes no difference.     Saw GI at [facility] 08/30/2019 (note reviewed). Extensive work up thus far with labs, EGD (06/11/18, report unavailable), [place], CT abd/pelvis - all WNL.    She reports foods and liquids feel like they get stuck in her upper chest.   She can vomit in the morning. It is unclear how often this is happening.     She reports chest burning. She is taking omeprazole 40 mg without relief. She is taking it before breakfast.     She is having 3-4 bowel movements daily with watery stool.     She is taking ibuprofen almost daily.     No weight loss, nausea, constipation, rectal bleeding or family history of colon cancer.     Wt Readings from Last 12 Encounters:   10/22/22 143 lb (64.9 kg)   10/17/22 149 lb (67.6 kg)   08/21/22 148 lb 1.6 oz (67.2 kg)   11/03/20 134 lb (60.8 kg)      History:  Past Medical History:   Diagnosis Date    Depression     History of stomach ulcers      History reviewed. No pertinent surgical history.  History reviewed. No pertinent family history.  Social History     Socioeconomic History    Marital status: Widowed     Spouse name: None    Number of children: None    Years of education: None    Highest education level: None   Occupational History    None   Tobacco Use    Smoking status: Never    Smokeless tobacco: Never   Substance and Sexual Activity    Alcohol use: Never    Drug use: Never    Sexual activity: Not Currently   Other Topics Concern    None   Social History Narrative    None     Social Determinants of Health     Financial  Resource Strain: Medium Risk (07/10/2021)    [facility] from [facility]    Overall Financial Resource Strain (CARDIA)     Difficulty of Paying Living Expenses: Somewhat hard   Food Insecurity: No Food Insecurity (08/21/2022)    Yearly Questionnaire     Do you need any assistance with obtaining housing, meals, medication, transportation or medical equipment?: No     Assistance needed for:: Not on file   Transportation Needs: No Transportation Needs (08/21/2022)    Yearly Questionnaire     Do you need any assistance with obtaining housing, meals, medication, transportation or medical equipment?: No     Assistance needed for:: Not on file   Physical Activity: Not on file   Stress: Not on file   Social Connections: Not on file   Intimate Partner Violence: Not on file   Housing Stability: Low Risk  (08/21/2022)    Yearly Questionnaire     Do you need any assistance with obtaining housing, meals, medication, transportation or medical equipment?: No  Assistance needed for:: Not on file     No Known Allergies    Home Medications:   Current Outpatient Medications   Medication Sig    acetaminophen Take 1 tablet (500 mg total) by mouth every 6 hours as needed.    cyanocobalamin Take 1 tablet (1,000 mcg total) by mouth daily.    ergocalciferol Take 1 capsule (50,000 Units total) by mouth once a week for 84 days.    escitalopram oxalate Take 1 tablet (20 mg total) by mouth daily.    ibuprofen Take 1 tablet (800 mg total) by mouth every 8 hours as needed for Pain.    levothyroxine Take 1 tablet (25 mcg total) by mouth every morning before breakfast.    lidocaine Place 1 patch onto the skin daily. Apply patch for 12 hours and then remove patch and leave off for 12 hours.    nystatin-triamcinolone Apply topically 4 times a day.    omeprazole Take 1 capsule (40 mg total) by mouth every morning before breakfast.    Saline Mist Use 1 spray into each nostril if needed for  Congestion.     No current facility-administered medications for this visit.         Physical Exam:    Vitals:    10/22/22 1216   BP: 118/77   BP Location: Left upper arm   Patient Position: Sitting   Pulse: 59   Weight: 143 lb (64.9 kg)   Height: 5' 1 (1.549 m)       Physical Exam  Vitals reviewed.   Constitutional:       Appearance: Normal appearance.   HENT:      Head: Normocephalic.      Nose: Nose normal.   Eyes:      Pupils: Pupils are equal, round, and reactive to light.   Cardiovascular:      Rate and Rhythm: Normal rate and regular rhythm.   Pulmonary:      Effort: Pulmonary effort is normal.      Breath sounds: Normal breath sounds.   Abdominal:      General: Bowel sounds are normal.      Palpations: Abdomen is soft.   Musculoskeletal:         General: Normal range of motion.      Cervical back: Normal range of motion.   Skin:     General: Skin is warm and dry.   Neurological:      General: No focal deficit present.      Mental Status: She is alert and oriented to person, place, and time.   Psychiatric:         Mood and Affect: Mood normal.         Behavior: Behavior normal.         Labs:   Lab Results   Component Value Date    BUN 16 08/21/2022    CO2 29 08/21/2022    CREATININE 0.54 (L) 08/21/2022    K 4.2 08/21/2022    NA 139 08/21/2022    CL 102 08/21/2022    CALCIUM 9.4 08/21/2022    PROT 7.3 08/21/2022    ALBUMIN 3.6 08/21/2022    BILITOT 0.8 08/21/2022    ALKPHOS 62 08/21/2022    AST 19 08/21/2022    ALT 17 08/21/2022     Lab Results   Component Value Date    WBC 5.7 08/21/2022    HGB 13.3 08/21/2022    HCT 39.5 08/21/2022  MCV 88.0 08/21/2022    PLT 215 08/21/2022     No results found for: PTT, INR  Lab Results   Component Value Date    TSH 12.80 (H) 10/17/2022         Assessment/Plan:  Problem List Items Addressed This Visit          Digestive    Esophageal dysphagia     She reports solid and liquids for likely get stuck in her upper chest.  Will proceed with EGD to rule out esophageal  stricture, ring or web.  She may need an esophageal dilation.            Other    Colon cancer screening - Primary     She has never had a colonoscopy.  I also reviewed her chart in search of a colonoscopy report and did not locate one.  She will be scheduled for a screening colonoscopy.  The risks were reviewed, including but not limited to perforation, bleeding, missed lesions, complications of sedation, and need for surgery. No changes will be made in her medications prior to the procedure. She will be given a split-dose GoLytely prep.           Heartburn     Noted for completeness.           Loose stools     She reports having 3-4 bowel movements daily with watery stool.  She takes ibuprofen almost daily.  Will have her complete KUB to evaluate stool burden.  Will also rule out microscopic colitis during colonoscopy.         Relevant Orders    X-ray Abdomen AP view    Epigastric pain     Mrs. Roker is a 47 year old female seen today for chron29ic abdominal pain since 2018.  Her pain is epigastric, dull and constant.  Nothing makes it better or worse.  Eating makes no difference.  She takes ibuprofen almost daily for her pain.  She is taking omeprazole 40 mg daily without relief.  She reports she takes it before breakfast.  She has been evaluated by GI at [facility] in 2021 (note was reviewed).  So far she has had an extensive workup with labs, EGD (done in 2020, report unavailable), ultrasound, CT scan abdomen pelvis -all WNL.  She also reports solids and liquids feeling they get stuck in her upper chest.  She reports intermittent vomiting in the morning.  It is unclear how often this is happening.  She will be scheduled for an esophagogastroduodenoscopy (EGD) to rule out esophagitis, gastritis, ulcer or H. pylori.  The risks were reviewed, including but not limited to perforation, bleeding, missed lesions, complications of sedation, and need for surgery. No changes will be made in her medications prior to the  procedure.   She is appropriate for any location however prefers to [facility], MAC anesthesia.              No follow-ups on file.      Laurell Roof , CNP  10/22/2022    This note was (at least in part) dictated using Dragon speech recognition software.  I reviewed the document in it's entirety.  Please excuse any errors.

## 2022-10-25 ENCOUNTER — Inpatient Hospital Stay: Admit: 2022-10-25 | Discharge: 2022-11-05 | Payer: PRIVATE HEALTH INSURANCE

## 2022-10-25 ENCOUNTER — Inpatient Hospital Stay: Admit: 2022-10-25 | Payer: PRIVATE HEALTH INSURANCE

## 2022-10-25 DIAGNOSIS — N644 Mastodynia: Secondary | ICD-10-CM

## 2022-10-25 DIAGNOSIS — N6311 Unspecified lump in the right breast, upper outer quadrant: Secondary | ICD-10-CM

## 2022-11-01 MED ORDER — polyethylene glycol (GOLYTELY) 236-22.74-6.74 -5.86 gram solution
236-22.74-6.74 | ORAL | 0 refills | 14.00000 days | Status: AC
Start: 2022-11-01 — End: ?

## 2022-11-11 ENCOUNTER — Ambulatory Visit: Admit: 2022-11-11 | Discharge: 2022-11-11 | Payer: PRIVATE HEALTH INSURANCE

## 2022-11-11 DIAGNOSIS — R7989 Other specified abnormal findings of blood chemistry: Secondary | ICD-10-CM

## 2022-11-11 MED ORDER — fluticasone propionate (FLONASE) 50 mcg/actuation nasal spray
50 | Freq: Every day | NASAL | 0 refills | Status: AC
Start: 2022-11-11 — End: ?

## 2022-11-11 MED ORDER — nystatin-triamcinolone (MYCOLOG) ointment
100000-0.1 | Freq: Four times a day (QID) | TOPICAL | 0 refills | Status: AC
Start: 2022-11-11 — End: ?

## 2022-11-11 MED ORDER — levothyroxine (SYNTHROID) 25 MCG tablet
25 | ORAL_TABLET | Freq: Every morning | ORAL | 0 refills | Status: AC
Start: 2022-11-11 — End: ?

## 2022-11-11 MED ORDER — ergocalciferol (ERGOCALCIFEROL) 1,250 mcg (50,000 unit) capsule
1250 | ORAL_CAPSULE | ORAL | 0 refills | Status: AC
Start: 2022-11-11 — End: 2023-02-03

## 2022-11-11 NOTE — Progress Notes (Signed)
[facility] AT [facility]  [address] [place] [address]  Subjective   Name:  Debra Davidson Date of Birth: [birth year] (47 y.o.)   MRN: [id number]    Date of Service:  11/11/2022      Chief Complaint   Patient presents with    Depression    Breast Pain    Abdominal Pain     Not sure of a ulcer    Tingling     ARMS AND LEGS     History of Present Illness:  Debra Davidson is a(n) 47 y.o. female here today for the following:   HPI    Reflux, abdominal pain, bad breath- has EGD scheduled for 6/20 with GI    Forgetfulness, fatigue  Struggling at work because she has had trouble remembering things for the past year. Feels it has been getting worse.   Thyroid ultrasound hasn't been completed   She likely ran out of thyroid medication a couple weeks ago. Last recheck TSH had increased from previous while on synthroid.     Having pain in her nose that has been ongoing for last 2 years. She has been putting drops in her nose which she is unable to identify for me.  Denies nasal trauma or illness.  The whole nose is tender to touch.    She needs paperwork completed for exception to learning english language in order to obtain [place] citizenship  She tried learning english in [place]   Attended 3 month language classes   Wasn't able to retain information due to forgetfulness and depression      Current Outpatient Medications   Medication Sig Dispense Refill    acetaminophen (TYLENOL) 500 MG tablet Take 1 tablet (500 mg total) by mouth every 6 hours as needed.      cyanocobalamin (VITAMIN B-12) 1000 MCG tablet Take 1 tablet (1,000 mcg total) by mouth daily.      ergocalciferol (ERGOCALCIFEROL) 1,250 mcg (50,000 unit) capsule Take 1 capsule (50,000 Units total) by mouth once a week for 84 days. 12 capsule 0    escitalopram oxalate (LEXAPRO) 20 MG tablet Take 1 tablet (20 mg total) by mouth daily. 90 tablet 1    ibuprofen (MOTRIN) 800 MG tablet Take 1 tablet (800 mg total) by mouth every 8 hours as needed for  Pain. 30 tablet 0    levothyroxine (SYNTHROID) 25 MCG tablet Take 1 tablet (25 mcg total) by mouth every morning before breakfast. 30 tablet 1    lidocaine (LIDODERM) 5 % Place 1 patch onto the skin daily. Apply patch for 12 hours and then remove patch and leave off for 12 hours. 30 patch 0    nystatin-triamcinolone (MYCOLOG) ointment Apply topically 4 times a day. 30 g 0    omeprazole (PRILOSEC) 40 MG capsule Take 1 capsule (40 mg total) by mouth every morning before breakfast. 30 capsule 0    polyethylene glycol (GOLYTELY) 236-22.74-6.74 -5.86 gram solution TAKE AS DIRECTED FOR COLONOSCOPY 4000 mL 0    polyethylene glycol (MIRALAX) 17 gram/dose powder Take 1 capful daily. Indications: constipation 527 g 11    sodium chloride (SALINE MIST) 0.65 % nasal spray Use 1 spray into each nostril if needed for Congestion. 15 mL 0     No current facility-administered medications for this visit.      Review of Systems   Constitutional:  Positive for fatigue. Negative for chills and fever.   HENT:  Nasal tenderness   Gastrointestinal:  Positive for heartburn.   Neurological:         Forgetfulness   Psychiatric/Behavioral:  Positive for depression.            Objective   Vitals:    11/11/22 1032   BP: 120/65   Pulse: 60   Temp: 96.9 F (36.1 C)   SpO2: 97%   Height: 5' 1 (1.549 m)   Weight: 149 lb 6.4 oz (67.8 kg)   BMI (Calculated): 28.24     Body mass index is 28.23 kg/m.        Physical Exam  HENT:      Nose: Nasal tenderness present. No nasal deformity, signs of injury, mucosal edema, congestion or rhinorrhea.      Right Nostril: No foreign body, epistaxis or occlusion.      Left Nostril: No foreign body, epistaxis or occlusion.      Right Sinus: No maxillary sinus tenderness or frontal sinus tenderness.      Left Sinus: No maxillary sinus tenderness or frontal sinus tenderness.                  Assessment & Plan     Debra Davidson was seen today for depression, breast pain, abdominal pain and tingling.    Diagnoses and  all orders for this visit:    Elevated TSH (Primary)  -     levothyroxine (SYNTHROID) 25 MCG tablet; Take 1.5 tablets (37.5 mcg total) by mouth every morning before breakfast.  -     Thyroid Function Cascade; Future    Forgetfulness    Vitamin D deficiency  -     ergocalciferol (ERGOCALCIFEROL) 1,250 mcg (50,000 unit) capsule; Take 1 capsule (50,000 Units total) by mouth once a week for 84 days.    Nasal pain  -     General ENT    Rash  -     nystatin-triamcinolone (MYCOLOG) ointment; Apply topically 4 times a day.    Fatigue, unspecified type    Cold intolerance    Localized swelling, mass or lump of neck    Other orders  -     fluticasone propionate (FLONASE) 50 mcg/actuation nasal spray; Use 1 spray into each nostril daily.    Nasal tenderness-will refer to ENT for evaluation.  Will have her stop the nasal drops that she is using as wondering if they are worsening symptoms, and switch to Flonase.  Slight redness in nose upon exam but no gross abnormalities were noted by me.     Will plan to continue vitamin D for another 12 weeks as her levels were quite low at 8.  Will recheck in 2 months along with B12 level when she comes in for her TSH recheck.    Will complete immigration paperwork and will discuss case with Dr. Alinda Money as physician will need to sign.  I agree with her current mental state that she would struggle to learn and retain [place] language.     Thyroid-  Provided her with phone number to call and schedule thyroid ultrasound.  Discussed importance of getting this test completed as she has the thyroid mass that is palpable, and steadily increasing thyroid hormone levels while on levothyroxine.  Discussed that I believe a lot of her symptoms could potentially be related to this.   Increased her levothyroxine dose today and will recheck in 2 months.  If thyroid mass biopsy is recommended based on ultrasound results, will place referral to endocrine  surgery.     Reflux, heartburn, bad breath-manage per  GI                    Kaleen Mask, CNP    Number and Complexity of Problems Addressed  2 or more stable chronic illnesses  1 acute, uncomplicated illness or injury  1 or more chronic illness with exacerbation, progression, or side effects of treatment    Amount and/or Complexity of Data to be Reviewed and Analyzed  Assessment requiring an independent historian that is not the patient    Risk of Complications and/or Morbidity or Mortality of Patient Management  Moderate    Time  I spent a total of 40 minutes on the day of the visit.

## 2022-11-11 NOTE — Patient Instructions (Addendum)
Call [phone] to schedule thryoid ultrasound   Restart thyroid medicaton  Will repeat vitamin D supplementation since her vitamin D level was very low

## 2022-11-12 NOTE — Progress Notes (Signed)
Unable to confirm procedure on 11/14/22, per interpreter patient phone cannot receive calls

## 2022-11-14 ENCOUNTER — Ambulatory Visit: Payer: PRIVATE HEALTH INSURANCE

## 2022-11-14 MED ORDER — propofol (DIPRIVAN) infusion 10 mg/mL
10 | INTRAVENOUS | PRN
Start: 2022-11-14 — End: 2022-11-14
  Administered 2022-11-14: 18:00:00 150 via INTRAVENOUS

## 2022-11-14 MED ORDER — sodium chloride 0.9 % IV infusion
INTRAVENOUS
Start: 2022-11-14 — End: 2022-11-14
  Administered 2022-11-14: 18:00:00 50 mL/h via INTRAVENOUS
  Administered 2022-11-14: 18:00:00 via INTRAVENOUS

## 2022-11-14 MED ORDER — propofol 10 mg/ml (DIPRIVAN) injection
10 | INTRAVENOUS | PRN
Start: 2022-11-14 — End: 2022-11-14
  Administered 2022-11-14: 18:00:00 30 via INTRAVENOUS
  Administered 2022-11-14: 18:00:00 50 via INTRAVENOUS

## 2022-11-14 MED ORDER — ondansetron (ZOFRAN) injection 4 mg
4 | Freq: Once | INTRAMUSCULAR | Status: AC
Start: 2022-11-14 — End: 2022-11-14
  Administered 2022-11-14: 17:00:00 4 mg via INTRAVENOUS

## 2022-11-14 MED ORDER — simethicone (MYLICON) 40 mg/0.6 mL drops
40 | ORAL
Start: 2022-11-14 — End: 2022-11-14

## 2022-11-14 MED ORDER — famotidine (PF) (PEPCID) injection 20 mg
20 | Freq: Once | INTRAVENOUS | Status: AC
Start: 2022-11-14 — End: 2022-11-14
  Administered 2022-11-14: 18:00:00 20 mg via INTRAVENOUS

## 2022-11-14 MED ORDER — dextrose 10%-water (D10W) IV soln
INTRAVENOUS | PRN
Start: 2022-11-14 — End: 2022-11-14

## 2022-11-14 MED ORDER — lidocaine (PF) 20 mg/mL (2 %) Soln
20 | INTRAVENOUS | PRN
Start: 2022-11-14 — End: 2022-11-14
  Administered 2022-11-14: 18:00:00 80 via INTRAVENOUS

## 2022-11-14 MED FILL — INFANTS GAS RELIEF 40 MG/0.6 ML ORAL DROPS,SUSPENSION: 40 40 mg/0.6 mL | ORAL | Qty: 30

## 2022-11-14 NOTE — Discharge Instructions (Signed)
[facility]  ENDOSCOPY INSTRUCTION/RELEASE FORMS    Date: 11/14/2022  Procedure: Procedure(s):  EGD  COLONOSCOPY W/ OR W/O BIOPSY    IF YOU DEVELOP EXCESSIVE BLEEDING, UNCONTROLLED PAIN OR SHORTNESS OF BREATH, GO TO THE EMERGENCY DEPARTMENT FOR AN EXAMINATION.  IF YOU DEVELOP CHILLS, FEVER (greater than 100.5) OR HAVE CONCERNS ABOUT YOUR RECOVERY, CALL Dr. Donald Prose AT [phone].     BECAUSE OF THE SEDATION YOU RECEIVED FOR YOUR PROCEDURE, PLEASE FOLLOW THE INSTRUCTIONS BELOW:  You may experience lightheadedness and nausea.  Rest today; no strenuous activity.  DO NOT:  Drink alcoholic beverages today.  Make serious financial or legal decisions today.  Operate automobiles, heavy machinery or use any appliances today.  Consult your physician about taking over the counter medications such as Motrin, Advil, Aleve, Ibuprofen or Naprosyn if you've had polyps removed.  Diet:  Resume your diet as tolerated or recommended by your primary care provider.  Medications:  The treatment you received today does not change your current medications.  New/revised prescriptions given to patient?: No.  Side Effects You May Experience:  Cramping, gas, bloating, belching.  Blood tinged mucous from rectum.  Additional:  Follow up with Dr. Donald Prose at [phone] for biopsy results.  Follow up for routine appointment/further testing  as determined per your Dr  Bonita Quin still had stool the colon at today's procedure  Please plan to have a repeat procedure in one year from now  Future Appointments   Date Time Provider Department Center   02/11/2023 10:30 AM Kaleen Mask, CNP [facility]     All Patient Belongings Have Been Returned: _________(patient initials)  I understand and acknowledge receipt of the above instructions.                                                                                                                                          Patient or Guardian Signature                                                          Date/Time  Physician's or R.N.'s Signature                                                                  Date/Time      The discharge instructions have been reviewed with the patient and/or Guardian.  Patient and/or Guardian signed and retained a printed copy.

## 2022-11-14 NOTE — Op Note (Signed)
[id number]  _______________________________________________________________________________  Procedure Date: 11/14/2022 1:28 PM     Patient Name: Debra Davidson  MRN: [id number]                         Account Number: [id number]  Date of Birth: [birth year]               Admit Type: Outpatient  Age: 47                               Gender: Female  Note Status: Finalized                Attending MD: Efraim Kaufmann , MD, [id number]  _______________________________________________________________________________     Procedure:             Colonoscopy  Indications:           Screening for colorectal malignant neoplasm  Patient Profile:       This is a 47 year old female. Refer to note in patient                          chart for documentation of history and physical.  Providers:             Efraim Kaufmann, MD  Referring MD:          Laurell Roof  Medicines:             Monitored Anesthesia Care  Complications:         No immediate complications. Estimated blood loss: None.  _______________________________________________________________________________  Procedure:             Pre-Anesthesia Assessment:                         - Prior to the procedure, a History and Physical was                          performed, and patient medications and allergies were                          reviewed. The patient is competent. The risks and                          benefits of the procedure and the sedation options and                          risks were discussed with the patient. All questions                          were answered and informed consent was obtained.                          Patient identification and proposed procedure were                          verified by the physician, the nurse, the                          anesthesiologist, the anesthetist and the technician  in the pre-procedure area. Mental Status Examination:                          alert and oriented. Respiratory Examination:  clear to                          auscultation. CV Examination: normal. Prophylactic                          Antibiotics: The patient does not require prophylactic                          antibiotics. Prior Anticoagulants: The patient has                          taken no anticoagulant or antiplatelet agents. ASA                          Grade Assessment: II - A patient with mild systemic                          disease. After reviewing the risks and benefits, the                          patient was deemed in satisfactory condition to                          undergo the procedure. The anesthesia plan was to use                          monitored anesthesia care (MAC). Immediately prior to                          administration of medications, the patient was                          re-assessed for adequacy to receive sedatives. The                          heart rate, respiratory rate, oxygen saturations,                          blood pressure, adequacy of pulmonary ventilation, and                          response to care were monitored throughout the                          procedure. The physical status of the patient was                          re-assessed after the procedure.                         After I obtained informed consent, the scope was  passed under direct vision. Throughout the procedure,                          the patient's blood pressure, pulse, and oxygen                          saturations were monitored continuously. The was                          introduced through the anus and advanced to the cecum,                          identified by appendiceal orifice and ileocecal valve.                          The colonoscopy was performed without difficulty. The                          patient tolerated the procedure well. The quality of                          the bowel preparation was evaluated using the BBPS                          [facility] Bowel  Preparation Scale) with scores of: Right                          Colon = 0 (unprepared, mucosa not seen due to solid                          stool that cannot be cleared or unseen proximal colon                          segment in a colonoscopy aborted due to inadequate                          bowel prep), Transverse Colon = 2 (minor amount of                          residual staining, small fragments of stool and/or                          opaque liquid, but mucosa seen well) and Left Colon =                          3 (entire mucosa seen well with no residual staining,                          small fragments of stool or opaque liquid). The total                          BBPS score equals 5. The ileocecal valve, appendiceal                          orifice, and rectum  were photographed.                                                                                   Findings:       The perianal and digital rectal examinations were normal.       A large amount of semi-liquid and semi-solid stool was found at the        hepatic flexure, in the ascending colon and in the cecum, precluding        visualization. Lavage performed with poor visulization on the right side        of the colon.       Non-bleeding external hemorrhoids were found during endoscopy. The        hemorrhoids were medium-sized.                                                                                   Estimated Blood Loss:       Estimated blood loss: none.  Impression:            - Semi solid and semi-liquid stool at the hepatic                          flexure, in the ascending colon and in the cecum. Poor                          visulization despite lavage.                         - Non-bleeding external hemorrhoids.                         - No specimens collected.  Recommendation:        - Patient has a contact number available for                          emergencies. The signs and symptoms of potential                           delayed complications were discussed with the patient.                          Return to normal activities tomorrow. Written                          discharge instructions were provided to the patient.                         - Resume previous diet.                         -  Continue present medications.                         - Repeat colonoscopy within 1 year because the bowel                          preparation was suboptimal.                                                                                   Procedure Code(s):     --- Professional ---                         Y8657, Colorectal cancer screening; colonoscopy on                          individual not meeting criteria for high risk  Diagnosis Code(s):     --- Professional ---                         Z12.11, Encounter for screening for malignant neoplasm                          of colon                         K64.4, Residual hemorrhoidal skin tags    CPT copyright 2022 American Medical Association. All rights reserved.    The codes documented in this report are preliminary and upon coder review may   be revised to meet current compliance requirements.  Attending Participation:       I personally performed the entire procedure.                                                                                     Dr. Efraim Kaufmann  ________________  Efraim Kaufmann, MD  11/14/2022 2:14:58 PM  This report has been signed electronically.Efraim Kaufmann , MD  Scope Withdrawal Time 0 hours 5 minutes 7 seconds   Total Procedure Duration Time 0 hours 8 minutes 22 seconds   Scope In: 2:01:58 PM  Scope Out: 2:10:20 PM         [address] [place], [place], [address]

## 2022-11-14 NOTE — Op Note (Signed)
[id number]  _______________________________________________________________________________  Procedure Date: 11/14/2022 1:28 PM     Patient Name: Debra Davidson  MRN: [id number]                         Account Number: [id number]  Date of Birth: [birth year]               Admit Type: Outpatient  Age: 47                               Gender: Female  Note Status: Finalized                Attending MD: Efraim Kaufmann , MD, [id number]  _______________________________________________________________________________     Procedure:             Upper GI endoscopy  Indications:           Epigastric abdominal pain  Patient Profile:       This is a 46 year old female. Refer to note in patient                          chart for documentation of history and physical.  Providers:             Efraim Kaufmann, MD  Referring MD:          Laurell Roof  Medicines:             Monitored Anesthesia Care  Complications:         No immediate complications. Estimated blood loss: None.  _______________________________________________________________________________  Procedure:             Pre-Anesthesia Assessment:                         - Prior to the procedure, a History and Physical was                          performed, and patient medications and allergies were                          reviewed. The patient is competent. The risks and                          benefits of the procedure and the sedation options and                          risks were discussed with the patient. All questions                          were answered and informed consent was obtained.                          Patient identification and proposed procedure were                          verified by the physician, the nurse, the                          anesthesiologist, the anesthetist and the technician  in the pre-procedure area. Mental Status Examination:                          alert and oriented. Respiratory Examination: clear to                           auscultation. CV Examination: normal. Prophylactic                          Antibiotics: The patient does not require prophylactic                          antibiotics. Prior Anticoagulants: The patient has                          taken no anticoagulant or antiplatelet agents. ASA                          Grade Assessment: II - A patient with mild systemic                          disease. After reviewing the risks and benefits, the                          patient was deemed in satisfactory condition to                          undergo the procedure. The anesthesia plan was to use                          monitored anesthesia care (MAC). Immediately prior to                          administration of medications, the patient was                          re-assessed for adequacy to receive sedatives. The                          heart rate, respiratory rate, oxygen saturations,                          blood pressure, adequacy of pulmonary ventilation, and                          response to care were monitored throughout the                          procedure. The physical status of the patient was                          re-assessed after the procedure.                         After obtaining informed consent, the endoscope was  passed under direct vision. Throughout the procedure,                          the patient's blood pressure, pulse, and oxygen                          saturations were monitored continuously. The was                          introduced through the mouth, and advanced to the                          second part of duodenum. The upper GI endoscopy was                          accomplished without difficulty. The patient tolerated                          the procedure well.                                                                                   Findings:       The examined esophagus was normal.       Diffuse mild inflammation  characterized by congestion (edema) and        erythema was found in the gastric antrum. Random biopsies were obtained        in the gastric body, at the incisura and in the gastric antrum with cold        forceps for histology and Helicobacter pylori testing.       The cardia and gastric fundus were normal on retroflexion.       The duodenal bulb and second portion of the duodenum were normal.        Biopsies for histology were taken with a cold forceps for evaluation of        celiac disease.                                                                                   Estimated Blood Loss:       Estimated blood loss: none.  Impression:            - Normal esophagus.                         - Mucosal changes suspicious for gastritis.Random                          biopsies were obtained in the gastric body, at the  incisura and in the gastric antrum.                         - Normal duodenal bulb and second portion of the                          duodenum. Biopsied.  Recommendation:        - Patient has a contact number available for                          emergencies. The signs and symptoms of potential                          delayed complications were discussed with the patient.                          Return to normal activities tomorrow. Written                          discharge instructions were provided to the patient.                         - Resume previous diet.                         - Continue present medications.                         - Await pathology results.                                                                                   Procedure Code(s):     --- Professional ---                         [phone], Esophagogastroduodenoscopy, flexible,                          transoral; with biopsy, single or multiple  Diagnosis Code(s):     --- Professional ---                         K31.89, Other diseases of stomach and duodenum                         R10.13,  Epigastric pain    CPT copyright 2022 American Medical Association. All rights reserved.    The codes documented in this report are preliminary and upon coder review may   be revised to meet current compliance requirements.  Attending Participation:       I personally performed the entire procedure.  Dr. Efraim Kaufmann  ________________  Efraim Kaufmann, MD  11/14/2022 1:59:52 PM  This report has been signed electronically.Efraim Kaufmann , MD  Total Procedure Duration Time 0 hours 9 minutes 7 seconds   Scope In: 1:46:59 PM  Scope Out: 1:56:06 PM         [address] [place], [place], [address]

## 2022-11-14 NOTE — Other (Signed)
[place]  PRE-SEDATION ASSESSMENT, HISTORY & PHYSICAL    Date: 11/14/2022     Debra Davidson is a 47 y.o. year old female MRN: [id number]    Pre-Procedure Diagnosis/Procedure Indication: epigastric pain, colon cancer screening  Planned Procedure: egd/colonoscopy  NPO for solids >8 hours, NPO for liquids >8 hours    Past Medical History     Past Medical History:   Diagnosis Date    Depression     History of stomach ulcers      Difficult intubation Unanswered       Patient Active Problem List   Diagnosis    Colon cancer screening    Heartburn    Esophageal dysphagia    Loose stools    Epigastric pain       Past Surgical History     No past surgical history on file.    Medications     Prior to Admission medications    Medication Sig Start Date End Date Taking? Authorizing Provider   acetaminophen (TYLENOL) 500 MG tablet Take 1 tablet (500 mg total) by mouth every 6 hours as needed.    Historical Provider, MD   cyanocobalamin (VITAMIN B-12) 1000 MCG tablet Take 1 tablet (1,000 mcg total) by mouth daily.    Historical Provider, MD   ergocalciferol (ERGOCALCIFEROL) 1,250 mcg (50,000 unit) capsule Take 1 capsule (50,000 Units total) by mouth once a week for 84 days. 11/11/22 02/03/23  Kaleen Mask, CNP   escitalopram oxalate (LEXAPRO) 20 MG tablet Take 1 tablet (20 mg total) by mouth daily. 10/17/22   Helane Rima Tanis, CNP   fluticasone propionate (FLONASE) 50 mcg/actuation nasal spray Use 1 spray into each nostril daily. 11/11/22   Helane Rima Tanis, CNP   ibuprofen (MOTRIN) 800 MG tablet Take 1 tablet (800 mg total) by mouth every 8 hours as needed for Pain. 07/03/21   Andris Flurry, PA   levothyroxine (SYNTHROID) 25 MCG tablet Take 1.5 tablets (37.5 mcg total) by mouth every morning before breakfast. 11/11/22   Helane Rima Tanis, CNP   lidocaine (LIDODERM) 5 % Place 1 patch onto the skin daily. Apply patch for 12 hours and then remove patch and leave off for 12 hours. 07/03/21   Andris Flurry, PA    nystatin-triamcinolone (MYCOLOG) ointment Apply topically 4 times a day. 11/11/22   Kaleen Mask, CNP   omeprazole (PRILOSEC) 40 MG capsule Take 1 capsule (40 mg total) by mouth every morning before breakfast. 08/21/22   Kaleen Mask, CNP   polyethylene glycol (GOLYTELY) 236-22.74-6.74 -5.86 gram solution TAKE AS DIRECTED FOR COLONOSCOPY 11/01/22   Laurell Roof, CNP   polyethylene glycol (MIRALAX) 17 gram/dose powder Take 1 capful daily. Indications: constipation 10/22/22   Laurell Roof, CNP   sodium chloride (SALINE MIST) 0.65 % nasal spray Use 1 spray into each nostril if needed for Congestion. 08/21/22   Kaleen Mask, CNP     Allergies:  No Known Allergies    Abbreviated Review of Systems (ROS)     Functional Capacity: [place] ACTIVITY SCALE: 3 - Walking on a flat surface for one or two blocks.  Chest Pain: no  Shortness of Breath/Dyspnea or Exertion: no  Recent URI: no    Airway, ASA Score & Sedation Specific History Concerns     Mallampati:  II  Mouth Opening:  < 4 cm  Facial Hair: No  Short Neck: No    ASA Score: II - Mild systemic disease with no functional limitations  Sedation-Specific History Concerns: None    This patient was re-evaluated immediately prior to sedation administration.          Focused Physical Exam:     Height ; Weight ; BMI There is no height or weight on file to calculate BMI.  There were no vitals filed for this visit.    Neuro: axxo3  Cardiovascular: Normal s1 and s2  Respiratory: cta b/l    Sedation Plan: mac    Antibiotic prophylaxis is not indicated.

## 2022-11-14 NOTE — TOC Discharge Planning (AHS/AVS) (Signed)
Anesthesia Transfer of Care Note    Patient: Debra Davidson  Procedure(s) Performed: Procedure(s):  EGD  COLONOSCOPY W/ OR W/O BIOPSY    Patient location: Endoscopy PACU    Anesthesia type: MAC    Airway Device on Arrival to PACU/ICU: Room Air    IV Access: Peripheral    Monitors Recommended to be Used During PACU/ICU: Standard Monitors    Outstanding Issues to Address: None    Level of Consciousness: awake    Post vital signs:    Vitals:    11/14/22 1417   BP: 99/61   Pulse: 55   Resp: 14   Temp:    SpO2: 95%       Complications:  There were no known notable events for this encounter.    Date 11/13/22 0700 - 11/14/22 0659(Not Admitted) 11/14/22 0700 - 11/15/22 0659   Shift 0700-1459 1500-2259 2300-0659 24 Hour Total 0700-1459 1500-2259 2300-0659 24 Hour Total   INTAKE   I.V.     500(7.6)   500(7.6)     Volume (mL) (sodium chloride 0.9 % IV infusion)     500   500   Shift Total(mL/kg)     500(7.6)   500(7.6)   OUTPUT   Shift Total(mL/kg)           Weight (kg)     65.8 65.8 65.8 65.8

## 2022-11-14 NOTE — Anesthesia Pre-Procedure Evaluation (Signed)
[facility]  DEPARTMENT OF ANESTHESIOLOGY  PRE-PROCEDURAL EVALUATION    Debra Davidson is a 47 y.o. year old female presenting for:    Procedure(s):  EGD  COLONOSCOPY W/ OR W/O BIOPSY    Surgeon:   Efraim Kaufmann, MD    Chief Complaint     Epigastric pain, Screening for colon cancer    Review of Systems     Anesthesia Evaluation         History of anesthetic complications (PONV)         Cardiovascular:      (+) hyperlipidemia.          (-) pacemaker, CABG/stent.    Neuro/Muscoloskeletal/Psych:    (+) psychiatric history (Psychosis (HCC)) and depression.         Pulmonary:              (-) shortness of breath, recent URI.       GI/Hepatic/Renal:    (+) PUD.  GERD is.      Bowel prep.    Comments: Hx h pylori    Endo/Other:    (+) hypothyroidism.         Comments: Bmi 28         Past Medical History     Past Medical History:   Diagnosis Date    Depression     History of stomach ulcers        Past Surgical History     No past surgical history on file.    Family History     Family History   Problem Relation Age of Onset    Heart attack Father        Social History     Social History     Socioeconomic History    Marital status: Widowed     Spouse name: Not on file    Number of children: Not on file    Years of education: Not on file    Highest education level: Not on file   Occupational History    Not on file   Tobacco Use    Smoking status: Never    Smokeless tobacco: Never   Substance and Sexual Activity    Alcohol use: Never    Drug use: Never    Sexual activity: Not Currently   Other Topics Concern    Not on file     Social Determinants of Health     Financial Resource Strain: Medium Risk (07/10/2021)    Received from [facility]    Overall Financial Resource Strain (CARDIA)     Difficulty of Paying Living Expenses: Somewhat hard   Food Insecurity: No Food Insecurity (08/21/2022)    Yearly Questionnaire     Do you need any assistance with obtaining housing, meals,  medication, transportation or medical equipment?: No     Assistance needed for:: Not on file   Transportation Needs: No Transportation Needs (08/21/2022)    Yearly Questionnaire     Do you need any assistance with obtaining housing, meals, medication, transportation or medical equipment?: No     Assistance needed for:: Not on file   Physical Activity: Not on file   Stress: Not on file   Social Connections: Not on file   Intimate Partner Violence: Not on file       Medications     Allergies:  No Known Allergies    Home Meds:  Prior to Admission medications as of 11/11/22  1035   Medication Sig Taking?   acetaminophen (TYLENOL) 500 MG tablet Take 1 tablet (500 mg total) by mouth every 6 hours as needed.    cyanocobalamin (VITAMIN B-12) 1000 MCG tablet Take 1 tablet (1,000 mcg total) by mouth daily.    ergocalciferol (ERGOCALCIFEROL) 1,250 mcg (50,000 unit) capsule Take 1 capsule (50,000 Units total) by mouth once a week for 84 days.    escitalopram oxalate (LEXAPRO) 20 MG tablet Take 1 tablet (20 mg total) by mouth daily.    fluticasone propionate (FLONASE) 50 mcg/actuation nasal spray Use 1 spray into each nostril daily.    ibuprofen (MOTRIN) 800 MG tablet Take 1 tablet (800 mg total) by mouth every 8 hours as needed for Pain.    levothyroxine (SYNTHROID) 25 MCG tablet Take 1.5 tablets (37.5 mcg total) by mouth every morning before breakfast.    lidocaine (LIDODERM) 5 % Place 1 patch onto the skin daily. Apply patch for 12 hours and then remove patch and leave off for 12 hours.    nystatin-triamcinolone (MYCOLOG) ointment Apply topically 4 times a day.    omeprazole (PRILOSEC) 40 MG capsule Take 1 capsule (40 mg total) by mouth every morning before breakfast.    polyethylene glycol (GOLYTELY) 236-22.74-6.74 -5.86 gram solution TAKE AS DIRECTED FOR COLONOSCOPY    polyethylene glycol (MIRALAX) 17 gram/dose powder Take 1 capful daily. Indications: constipation    sodium chloride (SALINE MIST) 0.65 % nasal spray Use 1 spray  into each nostril if needed for Congestion.        Inpatient Meds:  Scheduled:   Continuous:     PRN:     Vital Signs     Wt Readings from Last 3 Encounters:   11/11/22 149 lb 6.4 oz (67.8 kg)   10/22/22 143 lb (64.9 kg)   10/17/22 149 lb (67.6 kg)     Ht Readings from Last 3 Encounters:   11/11/22 5' 1 (1.549 m)   10/22/22 5' 1 (1.549 m)   10/17/22 5' 1 (1.549 m)     Temp Readings from Last 3 Encounters:   11/11/22 96.9 F (36.1 C)   10/17/22 97.3 F (36.3 C)   08/21/22 97.1 F (36.2 C)     BP Readings from Last 3 Encounters:   11/11/22 120/65   10/22/22 118/77   10/17/22 130/80     Pulse Readings from Last 3 Encounters:   11/11/22 60   10/22/22 59   10/17/22 57     SpO2 Readings from Last 3 Encounters:   11/11/22 97%   10/17/22 99%   08/21/22 98%       Physical Exam     Airway:     Mallampati: II  (-) no facial hair, neck not short, not intubated      Dental:   - No obvious cracked, loose, chipped, or missing teeth.     Pulmonary:      Breathing: unlabored       PE comment: Breathing comfortably on RA    Cardiovascular:     Rate: normal  PE comment: NAD    Neuro/Musculoskeletal/Psych:     Mental status: alert and oriented to person, place and time.          Abdominal:     Obese.    Current OB Status:       Other Findings:  NPO    + (chronic) nausea (last emesis 11/13/22).      Laboratory Data     Lab Results  Component Value Date    WBC 5.7 08/21/2022    HGB 13.3 08/21/2022    HCT 39.5 08/21/2022    MCV 88.0 08/21/2022    PLT 215 08/21/2022       No results found for: [facility]    Lab Results   Component Value Date    BUN 16 08/21/2022    CO2 29 08/21/2022    CREATININE 0.54 (L) 08/21/2022    K 4.2 08/21/2022    NA 139 08/21/2022    CL 102 08/21/2022    CALCIUM 9.4 08/21/2022    ALBUMIN 3.6 08/21/2022    PROT 7.3 08/21/2022    ALKPHOS 62 08/21/2022    ALT 17 08/21/2022    AST 19 08/21/2022    BILITOT 0.8 08/21/2022       No results found for: PTT, INR    No results found for: PREGTESTUR, PREGSERUM,  HCG, HCGQUANT    Anesthesia Plan     ASA 2         Female, current non-smoker and PONV    Anesthesia Type:  MAC.      PONV Risk Factors: female, Hx of PONV/Motion Sickness, current non-smoker              Induction:   Intravenous induction.    (Increased risk of recall/awareness with MAC  PONV ppx  [place] interpreter)  Anesthetic plan and risks discussed with patient.    Plan, alternatives, and risks of anesthesia, including death, have been explained to and discussed with the patient/legal guardian.  By my assessment, the patient/legal guardian understands and agrees.  Scenario presented in detail.  Questions answered.    Blood products not discussed.      Plan discussed with CRNA.

## 2022-11-14 NOTE — Anesthesia Post-Procedure Evaluation (Signed)
Anesthesia Post Note    Patient: Debra Davidson    Procedure(s) Performed: Procedure(s):  EGD  COLONOSCOPY W/ OR W/O BIOPSY    Anesthesia type: MAC    Patient location: Endoscopy PACU    Airway: Patent    Post pain: Adequate analgesia    Nausea / Vomiting: Absent    Post-operative Hydration Status: Adequate    Post assessment: no apparent anesthetic complications and tolerated procedure well    Last Vitals:   Vitals:    11/14/22 1336 11/14/22 1419 11/14/22 1430 11/14/22 1445   BP: 107/78 99/61 113/66 120/71   Pulse: 60      Resp: 17 18 16 16    Temp: 98.2 F (36.8 C) 97.4 F (36.3 C)     TempSrc: Oral Oral     SpO2: 98% 96% 98% 98%   Weight: 145 lb (65.8 kg)      Height: 5' 1 (1.549 m)           Last Temperature: 97.4 F (36.3 C) (11/14/2022  2:19 PM)      Post vital signs: stable    Level of consciousness: awake and responds to stimulation    Complications:  There were no known notable events for this encounter.

## 2022-11-15 NOTE — Telephone Encounter (Signed)
-----   Message from [facility] sent at 11/15/2022  8:21 AM EDT -----    ----- Message -----  From: Laurell Roof, CNP  Sent: 10/22/2022   2:39 PM EDT  To: Katherine Mantle, MA    Hi, please let her know her x-ray shows a medium amount of constipation.  She should start MiraLAX 1 capful daily.  I will send to her pharmacy.

## 2022-11-15 NOTE — Telephone Encounter (Signed)
Called pt using [employer].  Lmovm relaying SBL, CNP's results message.  Left my direct number for pt to call back on with any questions or concerns.

## 2022-11-25 NOTE — Telephone Encounter (Signed)
Left voicemail via Nepali interpreter that I was calling to review biopsy results.    Tell the patient that the biopsies came back normal/unremarkable. If patient is still having symptoms, should follow up in the office with Sam.     Colonoscopy was poor prep and 1 year follow up with a 2-day prep is recommended. She will need an OV to discuss the 2-day prep.

## 2022-12-02 NOTE — Telephone Encounter (Signed)
Left voicemail via Nepali interpreter that I was calling to review biopsy results.    Tell the patient that the biopsies came back normal/unremarkable. If patient is still having symptoms, should follow up in the office with Sam.     Colonoscopy was poor prep and 1 year follow up with a 2-day prep is recommended. She will need an OV to discuss the 2-day prep.

## 2022-12-03 NOTE — Telephone Encounter (Signed)
Left message for patient

## 2022-12-03 NOTE — Telephone Encounter (Signed)
Please call patient and advise that her paperwork is complete and she can come in to pick up. Does need signature from her on page 4. Will need Nepali interpreter.

## 2023-02-11 ENCOUNTER — Ambulatory Visit: Payer: PRIVATE HEALTH INSURANCE

## 2023-02-17 ENCOUNTER — Ambulatory Visit: Admit: 2023-02-17 | Discharge: 2023-02-17 | Payer: PRIVATE HEALTH INSURANCE

## 2023-02-17 ENCOUNTER — Other Ambulatory Visit: Admit: 2023-02-18 | Payer: PRIVATE HEALTH INSURANCE

## 2023-02-17 DIAGNOSIS — E039 Hypothyroidism, unspecified: Secondary | ICD-10-CM

## 2023-02-17 MED ORDER — escitalopram oxalate (LEXAPRO) 20 MG tablet
20 | ORAL_TABLET | Freq: Every day | ORAL | 3 refills | Status: AC
Start: 2023-02-17 — End: ?

## 2023-02-17 MED ORDER — omeprazole (PRILOSEC) 40 MG capsule
40 | ORAL_CAPSULE | Freq: Every morning | ORAL | 3 refills | Status: AC
Start: 2023-02-17 — End: ?

## 2023-02-17 MED ORDER — cetirizine (ZYRTEC) 10 MG tablet
10 | ORAL_TABLET | Freq: Every day | ORAL | 11 refills | Status: AC
Start: 2023-02-17 — End: ?

## 2023-02-17 NOTE — Patient Instructions (Addendum)
Kr?pay? kala garnuh?s ra tap?'??k? th?'ir?'i?a [phone] k? al?r?s?'un?a anus?cita garnuh?s    (Please call and schedule Thyroid Ultrasound.  Call [phone] to schedule.)    Mail? n?ka bagn? ra cil?'un? l?gi Zyrtec suru gar??  (I started Zyrtec for runny nose and itching)    Th?'ir?'i?a ragata par?k?a?ak? natij? pr?pta gar?pachi ma tap?'??k? th?'ir?'i?a au?adhi bh?li ph?rm?s?m? pa?h?'un?chu. H?m?l? tap?'?l?'? sah? ??jam? p?'unubha'?k? cha bhan? suni?cita garna pahil? ragata par?k?a?ak? natij? c?hincha.    (I will send your thyroid medicine to the pharmacy tomorrow once I get the results of the thyroid blood test. Need the blood test results first to make sure we have you on the right dose. )

## 2023-02-17 NOTE — Progress Notes (Signed)
[facility]  [address] [place] [address]  Subjective   Name:  Debra Davidson Date of Birth: 1976-01-25 (47 y.o.)   MRN: [id number]    Date of Service:  02/17/2023      Chief Complaint   Patient presents with    Follow-up     Pt stated she has rash all over body that is very itchy noticed 2-3 months ago. Nose bleeds very often.     Chest Pain    Nasal Congestion    Throat Pain     When pt eats food     History of Present Illness:  Annaliesa Davidson is a(n) 47 y.o. female here today for the following:     Patient is Nepali Speaking, exam was conducted with assistance of telephone interpreter services.     Here for thyroid recheck   Currently on levothyroxine 37.5, did run out a few days ago but was taking daily prior to this.   Last TSH was 12.8  She has not gotten the ultrasound of her thyroid yet  Still feels a mass on her neck    She is having nosebleeds when she wakes up in the morning. Nosebleeds almost daily. They do not last long.   States this has been going on for the last 3 years  No nasal injury. Not on blood thinners. H&H and platelets normal in March.   States the bleeding is accompanied by a lot of clear nasal drainage as well. Also states she has itchy watery eyes.     Having chest pain. States it is severe.   Describes it as a burning pain.   Hurts both at rest and with activity. Hurts when she lies down at night.   Does not eat spicy food anymore as this worsens the pain  Sometimes has a sensation of something getting stuck when she is swallowing  Had been on prilosec several months ago. Ran out. Did not ask for refill. Not sure if it helped or not. Had EGD/Colonoscopy a few months ago. EGD showed gastritis. No ulcers. Biopsies negative. Colonoscopy was poor prep and recommended repeat in 1 year.       Current Outpatient Medications   Medication Sig Dispense Refill    acetaminophen (TYLENOL) 500 MG tablet Take 1 tablet (500 mg total) by mouth every 6 hours as  needed.      cyanocobalamin (VITAMIN B-12) 1000 MCG tablet Take 1 tablet (1,000 mcg total) by mouth daily.      escitalopram oxalate (LEXAPRO) 20 MG tablet Take 1 tablet (20 mg total) by mouth daily. 90 tablet 3    fluticasone propionate (FLONASE) 50 mcg/actuation nasal spray Use 1 spray into each nostril daily. 16 g 0    ibuprofen (MOTRIN) 800 MG tablet Take 1 tablet (800 mg total) by mouth every 8 hours as needed for Pain. 30 tablet 0    levothyroxine (SYNTHROID) 25 MCG tablet Take 1.5 tablets (37.5 mcg total) by mouth every morning before breakfast. 135 tablet 0    lidocaine (LIDODERM) 5 % Place 1 patch onto the skin daily. Apply patch for 12 hours and then remove patch and leave off for 12 hours. 30 patch 0    nystatin-triamcinolone (MYCOLOG) ointment Apply topically 4 times a day. 30 g 0    omeprazole (PRILOSEC) 40 MG capsule Take 1 capsule (40 mg total) by mouth every morning before breakfast. 90 capsule 3    polyethylene  glycol (GOLYTELY) 236-22.74-6.74 -5.86 gram solution TAKE AS DIRECTED FOR COLONOSCOPY 4000 mL 0    polyethylene glycol (MIRALAX) 17 gram/dose powder Take 1 capful daily. Indications: constipation 527 g 11    sodium chloride (SALINE MIST) 0.65 % nasal spray Use 1 spray into each nostril if needed for Congestion. 15 mL 0    cetirizine (ZYRTEC) 10 MG tablet Take 1 tablet (10 mg total) by mouth daily. 30 tablet 11     No current facility-administered medications for this visit.      Review of Systems   Constitutional:  Negative for chills and fever.   HENT:  Positive for nosebleeds and rhinorrhea. Negative for ear pain and sore throat.    Respiratory:  Negative for cough and shortness of breath.    Cardiovascular:  Positive for chest pain.   Gastrointestinal:  Positive for heartburn. Negative for nausea and vomiting.           Objective   Vitals:    02/17/23 1033   BP: 116/76   Pulse: 61   SpO2: 98%   Height: 5' 1 (1.549 m)   Weight: 144 lb 6.4 oz (65.5 kg)   BMI (Calculated): 27.3     Body mass  index is 27.28 kg/m.        Physical Exam  Vitals reviewed.   Constitutional:       General: She is not in acute distress.     Appearance: Normal appearance. She is not ill-appearing.   HENT:      Head: Normocephalic.   Neck:      Thyroid: No thyroid mass or thyroid tenderness.   Cardiovascular:      Rate and Rhythm: Normal rate and regular rhythm.      Pulses: Normal pulses.      Heart sounds: Normal heart sounds. No murmur heard.  Pulmonary:      Effort: Pulmonary effort is normal. No respiratory distress.      Breath sounds: Normal breath sounds. No wheezing or rales.   Abdominal:      General: Bowel sounds are normal. There is no distension.      Palpations: Abdomen is soft. There is no mass.      Tenderness: There is abdominal tenderness in the epigastric area. There is no guarding or rebound. Negative signs include Murphy's sign, Rovsing's sign and McBurney's sign.      Hernia: No hernia is present.   Skin:     General: Skin is warm and dry.   Neurological:      General: No focal deficit present.      Mental Status: She is alert and oriented to person, place, and time.   Psychiatric:         Mood and Affect: Mood normal.         Behavior: Behavior normal.         Thought Content: Thought content normal.          EKG- done to evaluate chest pain- SB- no change from previous 6 months ago      Assessment & Plan     Fatema was seen today for follow-up, chest pain, nasal congestion and throat pain.    Diagnoses and all orders for this visit:    Acquired hypothyroidism (Primary)  -     Thyroid Function [place]; Future  -     US Thyroid-Neck-Head; Future    Nasal congestion  -     cetirizine (ZYRTEC) 10 MG tablet; Take 1  tablet (10 mg total) by mouth daily.    Frequent nosebleeds  -     CBC; Future  -     cetirizine (ZYRTEC) 10 MG tablet; Take 1 tablet (10 mg total) by mouth daily.    Seasonal allergies  -     cetirizine (ZYRTEC) 10 MG tablet; Take 1 tablet (10 mg total) by mouth daily.    Localized swelling, mass or  lump of neck  -     US Thyroid-Neck-Head; Future    Epigastric pain  -     omeprazole (PRILOSEC) 40 MG capsule; Take 1 capsule (40 mg total) by mouth every morning before breakfast.  -     PCN ECG 12-Lead (Non-MUSE)    History of gastrointestinal ulcer    Heartburn    Other chest pain  -     PCN ECG 12-Lead (Non-MUSE)    Screening for cardiovascular condition  -     CBC; Future  -     Comprehensive Metabolic Panel, Serum; Future  -     Lipid Profile; Future    Other orders  -     escitalopram oxalate (LEXAPRO) 20 MG tablet; Take 1 tablet (20 mg total) by mouth daily.        Medications refilled   Advised EKG normal aside from bradycardia which is unchanged from 6 months ago. Believe etiology of her chest pain is related to heartburn/reflux. Restarted prilosec.   Did not appreciate thyroid mass today but patient states neck/thyroid feels swollen. Advised we need to get her thyroid ultrasound to evaluate this-reordered the ultrasound and provided her with the phone number to call and schedule. Discussed this with her son as well who drives her to her appts.   Will likely need to increase levothyroxine dose, will wait to refill based on lab results tomorrow.   Nosebleeds- advised to try placing a humidifier at bedside. Will start zyrtec as she seems to have symptoms consistent with allergic rhinitis with sneezing, itchy eyes, and clear nasal drainage in the AM (in addition to the nosebleeds). Will check CBC to monitor platelets and H&H level considering the nosebleeds have been ongoing for years.        Return in about 3 months (around 05/19/2023).         Kaleen Mask, CNP    Number and Complexity of Problems Addressed  2 or more stable chronic illnesses  1 acute, uncomplicated illness or injury  1 undiagnosed new problem with uncertain prognosis    Amount and/or Complexity of Data to be Reviewed and Analyzed  3+ unique tests ordered    Risk of Complications and/or Morbidity or Mortality of Patient  Management  Moderate

## 2023-02-18 LAB — CBC
Hematocrit: 38.8 % (ref 35.0–45.0)
Hemoglobin: 13.1 g/dL (ref 11.7–15.5)
MCH: 29.4 pg (ref 27.0–33.0)
MCHC: 33.7 g/dL (ref 32.0–36.0)
MCV: 87.3 fL (ref 80.0–100.0)
MPV: 10.8 fL (ref 7.5–11.5)
Platelets: 191 10*3/uL (ref 140–400)
RBC: 4.44 10*6/uL (ref 3.80–5.10)
RDW: 13.1 % (ref 11.0–15.0)
WBC: 7.3 10*3/uL (ref 3.8–10.8)

## 2023-02-18 LAB — COMPREHENSIVE METABOLIC PANEL, SERUM
ALT: 11 U/L (ref 7–52)
AST (SGOT): 16 U/L (ref 13–39)
Albumin: 4 g/dL (ref 3.5–5.7)
Alkaline Phosphatase: 56 U/L (ref 36–125)
Anion Gap: 11 mmol/L (ref 3–16)
BUN: 14 mg/dL (ref 7–25)
CO2: 25 mmol/L (ref 21–33)
Calcium: 8.9 mg/dL (ref 8.6–10.3)
Chloride: 102 mmol/L (ref 98–110)
Creatinine: 0.61 mg/dL (ref 0.60–1.30)
EGFR: >90
Glucose: 89 mg/dL (ref 70–100)
Osmolality, Calculated: 286 mOsm/kg (ref 278–305)
Potassium: 4.5 mmol/L (ref 3.5–5.3)
Sodium: 138 mmol/L (ref 133–146)
Total Bilirubin: 0.7 mg/dL (ref 0.0–1.5)
Total Protein: 7.4 g/dL (ref 6.4–8.9)

## 2023-02-18 LAB — LIPID PANEL
Cholesterol, Total: 178 mg/dL (ref 0–200)
HDL: 38 mg/dL — ABNORMAL LOW (ref 60–92)
LDL Cholesterol: 106 mg/dL
Non-HDL Cholesterol, Calculated: 140 mg/dL — ABNORMAL HIGH (ref 0–129)
Triglycerides: 172 mg/dL — ABNORMAL HIGH (ref 10–149)

## 2023-02-18 LAB — THYROID FUNCTION CASCADE: TSH: 7.7 u[IU]/mL — ABNORMAL HIGH (ref 0.45–4.12)

## 2023-02-18 LAB — T4, FREE: Free T4: 0.77 ng/dL (ref 0.61–1.76)

## 2023-02-18 MED ORDER — levothyroxine (SYNTHROID) 50 MCG tablet
50 | ORAL_TABLET | Freq: Every morning | ORAL | 0 refills | Status: AC
Start: 2023-02-18 — End: ?

## 2023-06-11 ENCOUNTER — Other Ambulatory Visit: Admit: 2023-06-12 | Payer: PRIVATE HEALTH INSURANCE

## 2023-06-11 ENCOUNTER — Ambulatory Visit: Admit: 2023-06-11 | Discharge: 2023-06-11 | Payer: PRIVATE HEALTH INSURANCE

## 2023-06-11 ENCOUNTER — Ambulatory Visit: Payer: PRIVATE HEALTH INSURANCE

## 2023-06-11 DIAGNOSIS — E039 Hypothyroidism, unspecified: Secondary | ICD-10-CM

## 2023-06-11 LAB — T4, FREE: Free T4: 0.68 ng/dL (ref 0.61–1.76)

## 2023-06-11 LAB — THYROID FUNCTION CASCADE: TSH: 9.42 u[IU]/mL — ABNORMAL HIGH (ref 0.45–4.12)

## 2023-06-11 LAB — VITAMIN B12: Vitamin B-12: 422 pg/mL (ref 180–914)

## 2023-06-11 MED ORDER — escitalopram oxalate (LEXAPRO) 20 MG tablet
20 | ORAL_TABLET | Freq: Every day | ORAL | 3 refills | Status: AC
Start: 2023-06-11 — End: ?

## 2023-06-11 MED ORDER — polyethylene glycol (GOLYTELY) 236-22.74-6.74 -5.86 gram solution
236-22.74-6.74 | ORAL | 0 refills | 14.00000 days | Status: AC
Start: 2023-06-11 — End: ?

## 2023-06-11 MED ORDER — omeprazole (PRILOSEC) 40 MG capsule
40 | ORAL_CAPSULE | Freq: Every morning | ORAL | 3 refills | Status: AC
Start: 2023-06-11 — End: ?

## 2023-06-11 MED ORDER — levothyroxine (SYNTHROID) 75 MCG tablet
75 | ORAL_TABLET | Freq: Every morning | ORAL | 0 refills
Start: 2023-06-11 — End: 2023-08-14

## 2023-06-11 NOTE — Progress Notes (Signed)
 [facility] HEALTH PRIMARY CARE AT [facility]  [address] [place] [address]  Subjective   Name:  Debra Davidson Date of Birth: 05/22/1976 (48 y.o.)   MRN: [id number]    Date of Service:  06/11/2023      Chief Complaint   Patient presents with    Abdominal Pain     Patient c/o abdominal pain. Patient states she cannot do anything like sleep, eat, stand, walk without being in pain.    bad breath     Patient c/o of bad breath. Patient states she has people complaining about her breath stinking.    Memory Loss    Hypothyroidism     Has issues with thyroid. Patient states that swallowing her food is painful.     body weakness     Patient c/o body weakness. Patient states she cannot do anything due to this weakness.      History of Present Illness:  Debra Davidson is a(n) 48 y.o. female here today for the following:   HPI    Patient presents today with her son to address multiple concerns.  She is Nepali speaking only so exam was conducted using Nepali interpreter.   Primary concern is that she has been tired, weak, and forgetful.  She has hypothyroidism.  We have been working on titrating her levothyroxine .   She states it is hard and painful for her to swallow.  She has not gotten the thyroid ultrasound which we have discussed on numerous occasions in the past.      She has been struggling with memory issues for quite a while.  She states she forgets things she is told or supposed to do.  She states she is unable to retain new information.  She has never had any head imaging or seen neurology to discuss this.  Son is also requesting she sees psychiatry to see if it is something related to her depression or mental health.  Depression or mental health.  She is on Lexapro  which previously had seem to help with her mood.    She is complaining of having bad breath.  She states she brushes her teeth daily but has not seen a dentist in about a year.  She does not floss.  No dental pains.    She has had ongoing abdominal  pain.  She has been evaluated by GI multiple times without any findings.  She is on omeprazole .    They brought paperwork that I had completed to allow her an exemption from learning English for her citizenship test.  Son states she has been trying to learn English and has not been able to retain any information.  He states that they denied the paperwork that I had filled out.    Current Outpatient Medications   Medication Sig Dispense Refill    acetaminophen  (TYLENOL ) 500 MG tablet Take 1 tablet (500 mg total) by mouth every 6 hours as needed.      cetirizine  (ZYRTEC ) 10 MG tablet Take 1 tablet (10 mg total) by mouth daily. 30 tablet 11    escitalopram  oxalate (LEXAPRO ) 20 MG tablet Take 1 tablet (20 mg total) by mouth daily. 90 tablet 3    fluticasone  propionate (FLONASE ) 50 mcg/actuation nasal spray Use 1 spray into each nostril daily. 16 g 0    ibuprofen  (MOTRIN ) 800 MG tablet Take 1 tablet (800 mg total) by mouth every 8 hours as needed for Pain. 30 tablet 0  lidocaine  (LIDODERM ) 5 % Place 1 patch onto the skin daily. Apply patch for 12 hours and then remove patch and leave off for 12 hours. 30 patch 0    nystatin -triamcinolone  (MYCOLOG) ointment Apply topically 4 times a day. 30 g 0    omeprazole  (PRILOSEC ) 40 MG capsule Take 1 capsule (40 mg total) by mouth every morning before breakfast. 90 capsule 3    polyethylene glycol (GOLYTELY ) 236-22.74-6.74 -5.86 gram solution TAKE AS DIRECTED FOR COLONOSCOPY 4000 mL 0    polyethylene glycol (MIRALAX ) 17 gram/dose powder Take 1 capful daily. Indications: constipation 527 g 11    sodium chloride  (SALINE MIST) 0.65 % nasal spray Use 1 spray into each nostril if needed for Congestion. 15 mL 0    levothyroxine  (SYNTHROID ) 75 MCG tablet Take 1 tablet (75 mcg total) by mouth every morning before breakfast. 90 tablet 0    polyethylene glycol (GOLYTELY ) 236-22.74-6.74 -5.86 gram solution Please take your prep according to the instructions outlined in your prep letter in  messages and communications. 4000 mL 0     No current facility-administered medications for this visit.      Review of Systems  See HPI      Objective   Vitals:    06/11/23 0947   BP: 110/68   Pulse: 68   Temp: 97 F (36.1 C)   SpO2: 98%   Height: 5' 1 (1.549 m)   Weight: 148 lb 12.8 oz (67.5 kg)   BMI (Calculated): 28.13     Body mass index is 28.12 kg/m.        Physical Exam  Vitals reviewed.   Constitutional:       General: She is not in acute distress.     Appearance: Normal appearance. She is not ill-appearing.   HENT:      Head: Normocephalic.      Mouth/Throat:      Mouth: Mucous membranes are moist.      Dentition: Abnormal dentition. No dental tenderness or dental abscesses.      Pharynx: Oropharynx is clear. No oropharyngeal exudate or posterior oropharyngeal erythema.   Eyes:      Pupils: Pupils are equal, round, and reactive to light.   Cardiovascular:      Rate and Rhythm: Normal rate and regular rhythm.      Pulses: Normal pulses.      Heart sounds: Normal heart sounds.   Pulmonary:      Effort: Pulmonary effort is normal. No respiratory distress.      Breath sounds: Normal breath sounds. No wheezing or rales.   Abdominal:      General: Bowel sounds are normal. There is no distension.      Palpations: Abdomen is soft. There is no mass.      Tenderness: There is no abdominal tenderness.      Hernia: No hernia is present.   Lymphadenopathy:      Cervical: No cervical adenopathy.   Skin:     General: Skin is warm and dry.   Neurological:      General: No focal deficit present.      Mental Status: She is oriented to person, place, and time. She is lethargic.      Cranial Nerves: No facial asymmetry.      Motor: Motor function is intact.      Gait: Gait is intact.   Psychiatric:         Mood and Affect: Mood normal.  Behavior: Behavior normal.         Thought Content: Thought content normal.                  Assessment & Plan     Darlyne was seen today for abdominal pain, bad breath, memory loss,  hypothyroidism and body weakness.    Diagnoses and all orders for this visit:    Acquired hypothyroidism (Primary)  -     Thyroid Function Cascade; Future  -     levothyroxine  (SYNTHROID ) 75 MCG tablet; Take 1 tablet (75 mcg total) by mouth every morning before breakfast.  -     Thyroid Function Cascade; Future    Memory loss  -     MRI Head WO contrast; Future  -     Vitamin B12; Future  -     Memory/Cognitive Disorders - Eval, testing and treatment for suspected neurodegenerative diseases (does not require separate Neuropsych Testing Referral)    Halitosis    Epigastric pain  -     omeprazole  (PRILOSEC ) 40 MG capsule; Take 1 capsule (40 mg total) by mouth every morning before breakfast.    Other orders  -     escitalopram  oxalate (LEXAPRO ) 20 MG tablet; Take 1 tablet (20 mg total) by mouth daily.        Advise she see a dentist to see if there are any dental problems causing her bad breath.    Refilled omeprazole  and Lexapro     TSH Elevated, increased from 7.7 in September to 9.4 today. Will increase to 75 mcg and repeat in 3 months.   Encouraged her to get the thyroid ultrasound as she is complaining of swallowing problems and states it is tender.  Provided phone number to call and schedule the appointment.     Will check head MRI and place referral to neurology for cognitive evaluation.   Advised that I did not have any diagnoses that I could add to her paperwork this time, will see what neurology evaluation shows and can update once we get that information    Psychiatry referral placed per son's request-I advised that I felt neurology was more appropriate to help investigate her memory concerns but he still wanted her to see a psychiatrist as well. Did advise that UC may take a few months to get into so I provided list of outside psychiatry offices to reach out to as well.          Return in about 3 months (around 09/09/2023).         Zerick Prevette E Rad Gramling, CNP    Number and Complexity of Problems Addressed  1 or  more chronic illness with exacerbation, progression, or side effects of treatment  1 undiagnosed new problem with uncertain prognosis    Amount and/or Complexity of Data to be Reviewed and Analyzed  2 unique tests ordered  Assessment requiring an independent historian that is not the patient    Risk of Complications and/or Morbidity or Mortality of Patient Management  Moderate    Time  I spent a total of 46 minutes on the day of the visit.

## 2023-06-11 NOTE — Telephone Encounter (Signed)
 Please also advise that thyroid function came back elevated.   I am increasing levothyroxine from daily to daily. STOP the dose and START the dose that I sent to the pharmacy.

## 2023-06-11 NOTE — H&P (Signed)
 Open Access Endoscopy Procedure History         Procedure: Colonoscopy    Date: 11/14/2023    Patient ID: Pt. Is a 48 y.o. year old female    Indication(s): Average-risk Screening    Her primary care physician is KATHERINE E TANIS, CNP     Referring Provider: DR. LARISSA    Prior colonoscopy? Yes    Type of sedation: Monitored Anesthesia Care (MAC)      Interpreter services needed: No    Family history of colonic polyps or cancer. No    Any limitations on blood transfusions? No    Can the patient give consent? Yes    Is the patient on anticoagulation or antiplatelet drugs? No    Prep given: Spilt dose GoLYTELY     Past Medical History:   Diagnosis Date    Depression     History of stomach ulcers      Current Medications as of 06/11/2023  2:41 PM       Outpatient Medications         Quantity Refills Start End    acetaminophen  (TYLENOL ) 500 MG tablet -- --  --    cetirizine  (ZYRTEC ) 10 MG tablet 30 tablet 11 02/17/2023 --    cyanocobalamin  (VITAMIN B-12) 1000 MCG tablet -- --  --    escitalopram  oxalate (LEXAPRO ) 20 MG tablet 90 tablet 3 06/11/2023 --    fluticasone  propionate (FLONASE ) 50 mcg/actuation nasal spray 16 g 0 11/11/2022 --    ibuprofen  (MOTRIN ) 800 MG tablet 30 tablet 0 07/03/2021 --    levothyroxine  (SYNTHROID ) 50 MCG tablet 90 tablet 0 02/18/2023 --    lidocaine  (LIDODERM ) 5 % 30 patch 0 07/03/2021 --    nystatin -triamcinolone  (MYCOLOG) ointment 30 g 0 11/11/2022 --    omeprazole  (PRILOSEC ) 40 MG capsule 90 capsule 3 06/11/2023 --    polyethylene glycol (GOLYTELY ) 236-22.74-6.74 -5.86 gram solution 4000 mL 0 11/01/2022 --    polyethylene glycol (MIRALAX ) 17 gram/dose powder 527 g 11 10/22/2022 --    sodium chloride  (SALINE MIST) 0.65 % nasal spray 15 mL 0 08/21/2022 --    escitalopram  oxalate (LEXAPRO ) 20 MG tablet (Discontinued) 90 tablet 3 02/17/2023 06/11/2023    omeprazole  (PRILOSEC ) 40 MG capsule (Discontinued) 90 capsule 3 02/17/2023 06/11/2023                    There is no immunization history on file for this  patient.  Past Surgical History:   Procedure Laterality Date    COLONOSCOPY N/A 11/14/2022    Procedure: COLONOSCOPY W/ OR W/O BIOPSY;  Surgeon: Prentice Larissa, MD;  Location: [facility];  Service: Gastroenterology;  Laterality: N/A;    ESOPHAGOGASTRODUODENOSCOPY N/A 11/14/2022    Procedure: EGD;  Surgeon: Prentice Larissa, MD;  Location: [facility];  Service: Gastroenterology;  Laterality: N/A;     No Known Drug Allergies or Adverse Reactions    Social History     Tobacco Use    Smoking status: Never    Smokeless tobacco: Never   Substance Use Topics    Alcohol use: Never        BMI: Estimated body mass index is 28.12 kg/m as calculated from the following:    Height as of an earlier encounter on 06/11/23: 5' 1 (1.549 m).    Weight as of an earlier encounter on 06/11/23: 148 lb 12.8 oz (67.5 kg).      Debra Davidson  06/11/2023  2:41 PM  No notes on file

## 2023-06-11 NOTE — Telephone Encounter (Signed)
 PATIENT'S DAUGHTER CALLED TO SCHEDULE 51YR SURVEILLANCE COLO    Patient is scheduled for a COLO @ [facility] w/ Dr. LARISSA on 11/14/2023. Pt instructed to arrive at 0930 procedure is scheduled at 1100. Prep instructions for GOLYTELY  mailed to their verified address.     Rx e-scribed to [facility] [address].

## 2023-06-11 NOTE — Telephone Encounter (Signed)
 Patient son calling regarding referral for psych, he was trying to make appointment but they need referral. I see referrals for neurology but not psych. Son asked that referral be sent today and he be let know so he can make appointment because he will be working tomorrow. Please advise.

## 2023-06-11 NOTE — Telephone Encounter (Signed)
 Please call and advise that I placed psychiatry referral.   Phone number is [phone] to schedule. If UC cannot take her he can try calling the other phone numbers on the list I provided. I do think that seeing neurology will be more helpful in looking into her memory loss.   Please also advise that I will not be able to update any paperwork at the appt that he has scheduled for her in one month as she will need to have been evaluated by neurology first to see what the cause of her memory issues are.

## 2023-06-11 NOTE — Patient Instructions (Addendum)
 Get the thyroid ultrasound. Call [phone] to schedule. It will be done at [facility].   Last colonoscopy 11/23/22- due for repeat June 2025 due to poor bowel prep. Call [phone]   Go to a dentist to see if there is anything going on with the mouth causing the bad breath.   Levothyroxine  (thyroid medicine) needs to be taken by itself before other medications on an empty stomach. Do not need to take other medicines on an empty stomach.

## 2023-06-12 NOTE — Telephone Encounter (Signed)
 LVM for patient to call back. ?

## 2023-06-13 NOTE — Telephone Encounter (Signed)
 Lvm for patient to call back

## 2023-06-19 ENCOUNTER — Ambulatory Visit: Admit: 2023-06-19 | Discharge: 2023-06-19 | Payer: PRIVATE HEALTH INSURANCE

## 2023-06-19 DIAGNOSIS — E039 Hypothyroidism, unspecified: Secondary | ICD-10-CM

## 2023-06-19 NOTE — Patient Instructions (Signed)
 MENTAL HEALTH RESOURCES     [facility]**  [phone]    [facility]  [address]  [place], [place] [address]  [phone]  Fax: [phone]    [facility]  [address] [place]  [place], [place] [address]  [phone]  Fax: [phone]    [facility]  [facility]  [address]  [place], [place] [address]   [phone]    [facility]  [address]  [place], [place] [address]  [phone]  Fax: [phone]    [facility] (Katie/Dr. Delores, autism)  [address]  [place], [place] [address]  [phone]  Fax: [phone]    [facility]  [address]  [place], [place] [address]  [phone]    [facility]  [address] [place]  [place], [place] [address]  [phone]    [facility]  [address]   [place], [place]  [address]  [phone]       [facility]  [address]   [address]  [place], [place]    [phone]      [facility]   [address]   [place], [place]  [address]  [phone]    [facility]  [phone]    [facility], [place]   [address]   [address]  [place], [place]  [address]  [phone]    [facility]   [address]  [place], [place]  [address]  [phone]    [facility]  [phone]    [facility]  [address]   [place]  [address]  [phone]    [facility]  [address]  [address] [place]  [address]  [phone])    [facility]   [phone]    [facility]  [address]  [address]  [place], [place]   [phone]

## 2023-06-19 NOTE — Progress Notes (Signed)
 [facility] HEALTH PRIMARY CARE AT [facility]  [address] [place] [address]  Subjective   Name:  Debra Davidson Date of Birth: [birth year] (48 y.o.)   MRN: [id number]    Date of Service:  06/19/2023      Chief Complaint   Patient presents with    Paperwork     History of Present Illness:  [name] is a(n) 48 y.o. female here today for the following:   HPI    Patient presents today with Debra Davidson to discuss paperwork.   Debra Davidson is trying to obtain citizenship, but has been unable to learn English for the citizenship test.  Debra Davidson states it is due to short-term memory loss which Debra Davidson is struggled with for a while.    They had me fill this paperwork out for Debra last summer.  It was declined as there was no diagnosis/disability identified that would limit Debra ability to learn English permanently.  We do not have any diagnosis as to why Debra Davidson has memory problems Debra Davidson has an MRI scheduled in a couple of weeks and a neuroappointment for cognitive evaluation in a couple of months.  Debra Davidson does have hypothyroidism.  Not currently controlled, have been titrating Debra levothyroxine .  Debra Davidson does have depression.  Is on Lexapro .  Has improved with this but still struggling.  Has appointment with psychiatry in a couple of months.    They state that Debra visa will expire next month so Debra Davidson does not have time to wait for these appointments to obtain diagnosis as to why Debra Davidson is unable to learn English.      Little interest or pleasure in doing things: 3  Feeling down, depressed, or hopeless: 3  PHQ-2 Total Score: 6  Trouble falling or staying asleep, or sleeping too much: 3  Feeling tired or having little energy: 3  Poor appetite or overeating: 1  Feeling bad about yourself - or that you are a failure or have let yourself or your family down: 0  Trouble concentrating on things, such as reading the newspaper or watching television: 0  Moving or speaking so slowly that other people could have noticed. Or the opposite - being so fidgety or  restless that you have been moving around a lot more than usual: 1  Thoughts that you would be better off dead, or of hurting yourself in some way: 0  PHQ-9 Total Score: 14        Current Outpatient Medications   Medication Sig Dispense Refill    acetaminophen  (TYLENOL ) 500 MG tablet Take 1 tablet (500 mg total) by mouth every 6 hours as needed.      cetirizine  (ZYRTEC ) 10 MG tablet Take 1 tablet (10 mg total) by mouth daily. 30 tablet 11    escitalopram  oxalate (LEXAPRO ) 20 MG tablet Take 1 tablet (20 mg total) by mouth daily. 90 tablet 3    fluticasone  propionate (FLONASE ) 50 mcg/actuation nasal spray Use 1 spray into each nostril daily. 16 g 0    ibuprofen  (MOTRIN ) 800 MG tablet Take 1 tablet (800 mg total) by mouth every 8 hours as needed for Pain. 30 tablet 0    levothyroxine  (SYNTHROID ) 75 MCG tablet Take 1 tablet (75 mcg total) by mouth every morning before breakfast. 90 tablet 0    lidocaine  (LIDODERM ) 5 % Place 1 patch onto the skin daily. Apply patch for 12 hours and then remove patch and leave off for 12 hours. 30 patch 0  nystatin -triamcinolone  (MYCOLOG) ointment Apply topically 4 times a day. 30 g 0    omeprazole  (PRILOSEC ) 40 MG capsule Take 1 capsule (40 mg total) by mouth every morning before breakfast. 90 capsule 3    polyethylene glycol (GOLYTELY ) 236-22.74-6.74 -5.86 gram solution TAKE AS DIRECTED FOR COLONOSCOPY 4000 mL 0    polyethylene glycol (GOLYTELY ) 236-22.74-6.74 -5.86 gram solution Please take your prep according to the instructions outlined in your prep letter in messages and communications. 4000 mL 0    polyethylene glycol (MIRALAX ) 17 gram/dose powder Take 1 capful daily. Indications: constipation 527 g 11    sodium chloride  (SALINE MIST) 0.65 % nasal spray Use 1 spray into each nostril if needed for Congestion. 15 mL 0     No current facility-administered medications for this visit.      Review of Systems   Constitutional:  Positive for fatigue.   Neurological:         Short-term  memory loss   Psychiatric/Behavioral:  Positive for depression.            Objective   Vitals:    06/19/23 1051   Pulse: 69   Temp: 97.1 F (36.2 C)   SpO2: 99%   Height: 5' 1 (1.549 m)   Weight: 142 lb (64.4 kg)   BMI (Calculated): 26.84     Body mass index is 26.83 kg/m.        Physical Exam  Vitals reviewed.   Constitutional:       General: Debra Davidson is not in acute distress.     Appearance: Normal appearance. Debra Davidson is not ill-appearing.   HENT:      Head: Normocephalic.   Cardiovascular:      Rate and Rhythm: Normal rate and regular rhythm.   Pulmonary:      Breath sounds: Normal breath sounds.   Skin:     General: Skin is warm and dry.   Neurological:      General: No focal deficit present.      Mental Status: Debra Davidson is alert and oriented to person, place, and time.   Psychiatric:         Mood and Affect: Affect is flat.         Behavior: Behavior is withdrawn.         Cognition and Memory: Memory is impaired.                  Assessment & Plan     [name] was seen today for paperwork.    Diagnoses and all orders for this visit:    Acquired hypothyroidism (Primary)    Moderate episode of recurrent major depressive disorder (CMS-HCC)    Short-term memory loss        I advised I would be willing to add that I have referred Debra to psychiatry and to neurology for cognitive evaluation and management of depression, and that Debra Davidson is awaiting Head MRI results to see if they would be willing to give Debra a little more time to investigate Debra memory loss. I did advise that it was very likely that since I did not have any additional diagnoses to add or cause to Debra symptoms that it is likely that the exemption will be denied again.   Case reviewed with collaborating physician Andrez Scurry. Paperwork updated and signed by Debra.         Return if symptoms worsen or fail to improve.         Abagale Boulos E Marinell Igarashi,  CNP    Number and Complexity of Problems Addressed  1 or more chronic illness with exacerbation, progression, or side effects of  treatment    Amount and/or Complexity of Data to be Reviewed and Analyzed  Assessment requiring an independent historian that is not the patient    Risk of Complications and/or Morbidity or Mortality of Patient Management  Minimal    Time  I spent a total of 38 minutes on the day of the visit.

## 2023-06-19 NOTE — Telephone Encounter (Signed)
 Please call and advise that paperwork complete and ready for pickup.

## 2023-06-23 ENCOUNTER — Inpatient Hospital Stay: Admit: 2023-06-23 | Discharge: 2023-06-28 | Payer: PRIVATE HEALTH INSURANCE

## 2023-06-23 DIAGNOSIS — E039 Hypothyroidism, unspecified: Principal | ICD-10-CM

## 2023-06-23 NOTE — Telephone Encounter (Signed)
 Please advise I already filled out her paperwork. It should be up front for them to pick up.   I did review the note from Psychiatry. They did not diagnose any conditions to add to her paperwork that need to be added to her paperwork as I already listed depression.

## 2023-06-23 NOTE — Telephone Encounter (Signed)
Patient dropped off paperwork from [facility].  States waiting on paperwork to be filled out from you.  [phone]  Papers in mailbox.

## 2023-06-25 NOTE — Telephone Encounter (Signed)
 Please call and advise I have placed referral to Speech to see if they can further evaluate for any learning disabilities she may have that have been making it hard for her to be able to learn English. Phone number to call and schedule is [phone].

## 2023-06-26 NOTE — Telephone Encounter (Signed)
 Patient's son, Ilene dropped off Disability Certification paperwork again.  Is concerned that boxes regarding reading, speaking, writing English and answering questions regarding [place]  is checked because she cannot do these things.  I attached note from him to the forms.  His phone contact is [phone].   Papers in mailbox.

## 2023-06-26 NOTE — Telephone Encounter (Signed)
 Please advise that those boxes need to be checked as they indicate that she CAN NOT read, learn, speak, or write english. If the boxes are not checked she is not eligible to have the form filled out.   I already have listed that she has depression on her form as well as memory problems and hypothyroidism.  Please also advise I have placed referral to Speech to see if they can further evaluate for any learning disabilities that are causing her to have trouble learning English. Phone number to call and schedule is [phone].  I placed the paperwork back up front for them to pick up.

## 2023-07-08 ENCOUNTER — Inpatient Hospital Stay: Admit: 2023-07-08 | Discharge: 2023-07-12 | Payer: PRIVATE HEALTH INSURANCE

## 2023-07-08 DIAGNOSIS — R413 Other amnesia: Secondary | ICD-10-CM

## 2023-07-14 ENCOUNTER — Ambulatory Visit: Payer: PRIVATE HEALTH INSURANCE

## 2023-07-14 NOTE — Progress Notes (Signed)
 LVM for patient to return call of Debra Davidson message below.

## 2023-07-22 ENCOUNTER — Ambulatory Visit: Payer: PRIVATE HEALTH INSURANCE

## 2023-07-31 ENCOUNTER — Ambulatory Visit: Admit: 2023-07-31 | Payer: PRIVATE HEALTH INSURANCE

## 2023-07-31 DIAGNOSIS — F331 Major depressive disorder, recurrent, moderate: Secondary | ICD-10-CM

## 2023-07-31 NOTE — Progress Notes (Signed)
 Visit Start Time:  9:50am   Visit End Time:  11:05am    Chief Complaint   Patient presents with    Follow-up    Medication Management       Context: other: New consultation  Location: Altered mental status of mood, anxiety, and cognition  Duration: >365 days.  Severity: moderate .  Associated Symptoms: moderate.  Modifying Factors: other: cognitive/memory changes.    Subjective   History of Present Illness:   Debra Davidson is a 48 y.o. female with a history of depression and memory changes.  She presents today to establish care.  She is referred by her PCP.  Medical history includes blurred vision and wears glasses.  She has been treated for thyroid condition, reflux, stomach ulcer, migraine headaches, and hypercholesterolemia.  Her history is provided by her oldest son who is here with her today.  She is not engaged most of the session but may give short answers when her son repeats this NP's question and speaks directly to her.  A Nepali interpreter was used for clarification even though her son is able to speak [place].    Interpreter: Laurena Slimmer #[id number]    HPI   PSYCHIATRY INITIAL EVALUATION    Debra Davidson is currently taking Lexapro 20mg  daily for anxiety and depression.  Her history is primarily taken by her son.  When she is asked questions, she often speaks so softly she cannot be heard so her son will have to repeat what she says.      Mood/Anxiety:  Her mood always swings.  Sometimes it is anger and sometimes it is happy and often it is sad.  There are not always reasons for it to change.  Sometimes, she likes to go out and do things but often she does not like noise or crowds and that irritates her.  She likes to be alone.  Mood can depend on the atmosphere.  She spends most of her time sad based on her facial expression.  She can say she feels anxious and restless and like she needs to go or do something.  He does not know exactly when mood changes occurred but possibly 1.5 years ago.     She does  get anxious but they never know what about.  When she starts to over think she will start breathing very fast.  They give her cold water to drink, take her out to parks, fresh air and nature.  They try to talk about other things to distract her.  She has not taken any medication for panic.      She does not have any history of OCD.      Cognition:  She has issues with memory that have been going on for a long time but are getting worse.  2-3 years ago she started having problem forgetting things.  They took it lightly thinking it was common.  They did not know it would be serious at that time.  They took her to the PCP.  There was no injury.  Gradually over years things have worsened.  She was not very efficient with school work when she was younger and could not finish her education.  She is usually very sad and cannot remember things both from long ago or recent.  She had an MRI that was not normal and she is scheduled to see a neurologist.     She does some chores herself at home.  They have to tell her how  to do things and remind her to do things like shower.  They do not allow her to cook.  In her son's opinion, she may need encouragement to do things on her own because of her depression.  She cannot read.  She has worked outside of the home.  They have been in the [place] since 09/09/11    Prior to the last few years, she was quite better.  Ever since she was a kid she had a problem of slow learning and not able to catch onto things very quickly.  She would understand things better than she does now.  Her kids were not as aware of her mood prior because they were young.  He thinks after his father died, her condition has been worse.  He died 09-08-08.      Psychosis:  He has never noticed any signs of psychosis - AVH or delusions.       PTSD:  Her son does not know that any abuse or trauma happened to her.  When asked directly, Lynniah also denies any history of abuse.      Sleep:  She has trouble falling asleep but once  asleep, she sleeps for 8-9 hours.  It takes 4-5 hours to fall asleep.  She sleeps 1/2am until 10/11am.      Appetite:  She does not really want to eat.  When she eats food, she feels someone has put something heavy on her chest.  She eats whatever everyone else is eating.    She lives with her oldest son, his wife, their kids.  His younger brother and 2 sisters live with them as well.      Discussed medication plan.  Her son states it would be great if she could be cured but is concerned for side effects with changing to a different medication.  There were abnormalities on her MRI and she is scheduled to see neurology to see what her condition is and if there is a treatment plan.  Her son feels they do not want to make any medication change today, even though the Lexapro does not seem to be working.       Plan today is for her to continue Lexapro 20mg  daily for now and she will have her appointment with neurology.  Once they have a better understanding of what is going on with her brain, they may return to further discuss treatment options, if appropriate.   Requested son bring her early next appointment so he could help her answer screening questionnaires.      Past Psychiatric History:    Prior hospitalizations: none   Prior diagnoses: depression   Prior medication trials: Lexapro   Outpatient Treatment: none   Suicide Attempts: denies      Substance Use History:   Nicotine: none   Alcohol: none   Illicits: none   Caffeine: coffee a few times per week      Social/Developmental History:    Relationship:   Children: 2 sons, 2 daughters   Housing: lives with son, his wife, their children, younger son, 2 daughters   Occupation/Income: none   Education:    Religion:    Legal hx: none   Abuse hx: denies   Violence hx: none       Family History:    Medical:   Psychiatric:   History of completed suicide:     Allergies:  No Known Drug Allergies or Adverse Reactions    Home Medications:  Home Medications    Medication Sig  Taking? Last Dose   acetaminophen (TYLENOL) 500 MG tablet Take 1 tablet (500 mg total) by mouth every 6 hours as needed. Yes    cetirizine (ZYRTEC) 10 MG tablet Take 1 tablet (10 mg total) by mouth daily. Yes    escitalopram oxalate (LEXAPRO) 20 MG tablet Take 1 tablet (20 mg total) by mouth daily. Yes    fluticasone propionate (FLONASE) 50 mcg/actuation nasal spray Use 1 spray into each nostril daily. Yes    ibuprofen (MOTRIN) 800 MG tablet Take 1 tablet (800 mg total) by mouth every 8 hours as needed for Pain. Yes    levothyroxine (SYNTHROID) 75 MCG tablet Take 1 tablet (75 mcg total) by mouth every morning before breakfast. Yes    lidocaine (LIDODERM) 5 % Place 1 patch onto the skin daily. Apply patch for 12 hours and then remove patch and leave off for 12 hours. Yes    nystatin-triamcinolone (MYCOLOG) ointment Apply topically 4 times a day. Yes    omeprazole (PRILOSEC) 40 MG capsule Take 1 capsule (40 mg total) by mouth every morning before breakfast. Yes    polyethylene glycol (GOLYTELY) 236-22.74-6.74 -5.86 gram solution TAKE AS DIRECTED FOR COLONOSCOPY Yes    polyethylene glycol (GOLYTELY) 236-22.74-6.74 -5.86 gram solution Please take your prep according to the instructions outlined in your prep letter in messages and communications. Yes    polyethylene glycol (MIRALAX) 17 gram/dose powder Take 1 capful daily. Indications: constipation Yes    sodium chloride (SALINE MIST) 0.65 % nasal spray Use 1 spray into each nostril if needed for Congestion. Yes      OBJECTIVE:  Vitals:  Marland Kitchen  Vitals:    07/31/23 0943   BP: 129/84   BP Location: Left upper arm   Patient Position: Sitting   BP Cuff Size: Regular   Pulse: (!) 49   Weight: 143 lb (64.9 kg)   Height: 5' 1 (1.549 m)       The following portions of the patient's history were reviewed and updated as appropriate: allergies, current medications, past family history, past medical history, past social history, past surgical history, and problem list.    Patient  History:     Psychiatric History:     Prior Inpatient Psychiatric Hospitalization(s): has never been hospitalized  Prior Out-patient Treatment: denies any psychiatric outpatient treatments  Prior Psychotropic Medications: denies taking prior psychotropic medications.  Current Psychotropic Medications: Yes (see medication list below) of current psychotropic medications.    Past Medical History:   Diagnosis Date    Depression     History of stomach ulcers     Hypothyroid     Migraine        Past Surgical History:   Procedure Laterality Date    COLONOSCOPY N/A 11/14/2022    Procedure: COLONOSCOPY W/ OR W/O BIOPSY;  Surgeon: Efraim Kaufmann, MD;  Location: [facility];  Service: Gastroenterology;  Laterality: N/A;    ESOPHAGOGASTRODUODENOSCOPY N/A 11/14/2022    Procedure: EGD;  Surgeon: Efraim Kaufmann, MD;  Location: [facility];  Service: Gastroenterology;  Laterality: N/A;       Social History     Socioeconomic History    Marital status: Widowed     Spouse name: None    Number of children: None    Years of education: None    Highest education level: None   Occupational History    None   Tobacco Use    Smoking status: Never  Smokeless tobacco: Never   Substance and Sexual Activity    Alcohol use: Never    Drug use: Never    Sexual activity: Not Currently   Other Topics Concern    Caffeine Use Yes     Comment: coffee - 3-4 times per week    Occupational Exposure Not Asked    Exercise Yes     Comment: walks 2-3 times per week    Seat Belt Not Asked   Social History Narrative    None     Social Drivers of Health     Financial Resource Strain: Medium Risk (07/10/2021)    [facility]    Overall Financial Resource Strain (CARDIA)     Difficulty of Paying Living Expenses: Somewhat hard   Food Insecurity: No Food Insecurity (08/21/2022)    Yearly Questionnaire     Do you need any assistance with obtaining housing, meals, medication, transportation or medical  equipment?: No     Assistance needed for:: Not on file   Transportation Needs: No Transportation Needs (08/21/2022)    Yearly Questionnaire     Do you need any assistance with obtaining housing, meals, medication, transportation or medical equipment?: No     Assistance needed for:: Not on file   Physical Activity: Not on file   Stress: Not on file   Social Connections: Not on file   Intimate Partner Violence: Not At Risk (11/14/2022)    Humiliation, Afraid, Rape, and Kick questionnaire     Fear of Current or Ex-Partner: No     Emotionally Abused: No     Physically Abused: No     Sexually Abused: No   Housing Stability: Low Risk  (08/21/2022)    Yearly Questionnaire     Do you need any assistance with obtaining housing, meals, medication, transportation or medical equipment?: No     Assistance needed for:: Not on file       Family History   Problem Relation Age of Onset    Heart attack Father     Depression Sister        Review of Systems   Constitutional:  Positive for appetite change.   Cardiovascular:         Feels heaviness in her chest after eating   Psychiatric/Behavioral:  Positive for depression and sleep disturbance. The patient is nervous/anxious.         Irritability.  Memory changes        Objective   Physical Exam  Vitals reviewed.   Constitutional:       Appearance: Normal appearance.      Comments: Sometimes appeared to be falling asleep during visit   HENT:      Head: Normocephalic and atraumatic.      Nose:      Comments: Wearing a mask  Cardiovascular:      Rate and Rhythm: Bradycardia present.   Pulmonary:      Effort: Pulmonary effort is normal.   Psychiatric:         Attention and Perception: She is inattentive.         Mood and Affect: Affect is flat.         Behavior: Behavior is uncooperative.      Comments: Unable to assess patient as she did not answer this NP's questions and only said anything when her son spoke to her directly.         Gait and  Stations: Normal      Mental Status Exam       Motor Behavior:   no eye contact during appointment.  Did not speak unless directly spoken to in Nepali by her son.  Often had her eyes closed.    Cognition:   unable to assess  Attitude:  guarded  Affect:  flat   Appearance:  appropriately dressed, good hygiene, and well groomed   Speech:  soft and did not answer questions for this NP.  She only responded when her son directly asked her a question.  Mood:  depressed   Thought Processes:    unable to assess  Perceptions:   she did not appear to be responding to unknown stimuli    Thought content:  unable to assess    Insight/ judgement:  unable to assess  Suicidal ideation:  denies when asked directly by her son  Homicidal ideation: none    Lab Review:    Latest Reference Range & Units 02/17/23 20:59   Sodium 133 - 146 mmol/L 138   Potassium 3.5 - 5.3 mmol/L 4.5   Chloride 98 - 110 mmol/L 102   Carbon Dioxide (CO2) 21 - 33 mmol/L 25   Anion Gap 3 - 16 mmol/L 11   BUN 7 - 25 mg/dL 14   Creatinine 6.96 - 1.30 mg/dL 2.95   Glucose 70 - 284 mg/dL 89   EGFR  >13   Calcium 8.6 - 10.3 mg/dL 8.9   Calculated Osmolality, Serum 278 - 305 mOsm/kg 286   Alkaline Phosphatase 36 - 125 U/L 56   AST (SGOT) 13 - 39 U/L 16   ALT (SGPT) 7 - 52 U/L 11   Albumin 3.5 - 5.7 g/dL 4.0   Protein, Total 6.4 - 8.9 g/dL 7.4   Bili, Total 0.0 - 1.5 mg/dL 0.7   Cholesterol, Total 0 - 200 mg/dL 244   Triglycerides, Serum 10 - 149 mg/dL 010 (H)   HDL 60 - 92 mg/dL 38 (L)   Non-HDL Cholesterol, Calculated 0 - 129 mg/dL 272 (H)   LDL Cholesterol mg/dL 536   (H): Data is abnormally high  (L): Data is abnormally low   Latest Reference Range & Units 06/11/23 10:46   Vitamin B-12 180 - 914 pg/mL 422      Latest Reference Range & Units 02/17/23 20:57   WBC 3.8 - 10.8 10E3/uL 7.3   RBC 3.80 - 5.10 10E6/uL 4.44   Hemoglobin 11.7 - 15.5 g/dL 64.4   Hematocrit 03.4 - 45.0 % 38.8   MCV 80.0 - 100.0 fL 87.3   MCH 27.0 - 33.0 pg 29.4   MCHC 32.0 - 36.0 g/dL 74.2   RDW 59.5 - 63.8 % 13.1   Platelet Count 140  - 400 10E3/uL 191   MPV 7.5 - 11.5 fL 10.8      Latest Reference Range & Units 06/11/23 10:46   Free T4 0.61 - 1.76 ng/dL 7.56   TSH 4.33 - 2.95 uIU/mL 9.42 (H)   (H): Data is abnormally high     Assessment   Moderate episode of recurrent major depressive disorder (CMS-HCC)  Memory loss  Anxiety  Debra Davidson is a 48 year old female with a history of depression, anxiety, and memory loss that have started over the last couple of years and seem to be worsening.  Her history is provided by her son so it is difficult to make an accurate assessment.  A Nepali interpreter is used but she only  will answer questions when asked directly by her son.  Her affect is flat and she does not make eye contact with this NP and, at times, seem to be falling asleep with her eyes closed.  Per son's report, mood is often sad and withdrawn but can also be angry or happy.  She will report feeling anxious and restless.  Sleep initiation is difficult.  Appetite is reduced because of feeling like something is on her chest when she eats.  Memory loss is most prominent symptom next to depression and seems to be worsening.  She had an abnormal MRI and is scheduled to see neurology.  Problem List Items Addressed This Visit    None      No orders of the defined types were placed in this encounter.       Plan     GOALS/ STRATEGIES OF TREATMENT PLAN    Improvement in mood, anxiety, sleep, cognition  Continue Lexapro 20mg  daily for anxiety and depression  See neurology as scheduled to review abnormal MRI  Follow up once treatment plan is determined with neurology, if appropriate.  Will discuss medication changes at that time, if appropriate.  NP to refer to psychiatry specializing in cognitive changes, if needed.    Safety: No Imminent risk of danger to/self/others based on the factors considered below. Appropriate for outpatient level of care.  Safety plan includes: 911, PES, hotlines, and interventions discussed today.   Risk factors: Depressed mood,  cognitive impairment, no outpatient services in place.  Protective factors: Age >71 and <55, female gender, denies suicidal ideation, does not have lethal plan, does not have access to guns or weapons, patient is contracting for safety, no prior suicide attempts, no substance abuse, patient has social or family support, no active psychosis, compliant with recommended medications, collateral information from son confirms patient safety.  Reviewed recommended plan of either seeing neurology first and then following up to discuss appropriateness of medication changes or switching Lexapro to another SSRI as well as risks, benefits and side effects of doing so with patient and her son.  Only the son verbalized understanding and declined any treatment changes today.

## 2023-08-13 ENCOUNTER — Ambulatory Visit: Admit: 2023-08-13 | Discharge: 2023-08-13 | Payer: PRIVATE HEALTH INSURANCE

## 2023-08-13 ENCOUNTER — Ambulatory Visit: Admit: 2023-08-14 | Payer: PRIVATE HEALTH INSURANCE

## 2023-08-13 DIAGNOSIS — E039 Hypothyroidism, unspecified: Secondary | ICD-10-CM

## 2023-08-13 LAB — THYROID FUNCTION CASCADE: TSH: 3.64 u[IU]/mL (ref 0.45–4.12)

## 2023-08-13 LAB — POCT URINALYSIS DIPSTICK, NONAUTOMATED; W/O MICRO
POCT - Glucose, UA: NEGATIVE
POCT - Ketones, UA: NEGATIVE
POCT - Leukocytes Esterase, UA: NEGATIVE
POCT - Nitrite, UA: NEGATIVE
POCT - Protein, UA: 15
POCT - Specific Gravity, Urine: 1.03
POCT - Urobilinogen, UA: NEGATIVE
POCT - pH, UA: 5.5

## 2023-08-13 MED ORDER — nitrofurantoin, macrocrystal-monohydrate, (MACROBID) 100 MG capsule
100 | ORAL_CAPSULE | Freq: Two times a day (BID) | ORAL | 0 refills | Status: AC
Start: 2023-08-13 — End: 2023-08-18

## 2023-08-13 NOTE — Progress Notes (Signed)
 [facility]  [address] [place] [address]  Subjective   Name:  Debra Davidson Date of Birth: Oct 18, 1975 (48 y.o.)   MRN: [id number]    Date of Service:  08/13/2023      Chief Complaint   Patient presents with    Hypothyroidism     History of Present Illness:  Debra Davidson is a(n) 48 y.o. female here today for the following:   HPI    Patient presents today with her son for follow-up on hypothyroidism and to discuss MRI results.  Nepalese interpreter used to conduct the exam. Son was the primary historian for the exam.  He reports she has still been continuing to struggle with memory issues, depression, fatigue.   States she went to see psychiatry.  No changes were made.  He is wondering what the MRI results showed.  He also stated at the end of the appointment as they were leaving that she was struggling with burning upon urination for the last few days.    Current Outpatient Medications   Medication Sig Dispense Refill    acetaminophen (TYLENOL) 500 MG tablet Take 1 tablet (500 mg total) by mouth every 6 hours as needed.      cetirizine (ZYRTEC) 10 MG tablet Take 1 tablet (10 mg total) by mouth daily. 30 tablet 11    escitalopram oxalate (LEXAPRO) 20 MG tablet Take 1 tablet (20 mg total) by mouth daily. 90 tablet 3    fluticasone propionate (FLONASE) 50 mcg/actuation nasal spray Use 1 spray into each nostril daily. 16 g 0    ibuprofen (MOTRIN) 800 MG tablet Take 1 tablet (800 mg total) by mouth every 8 hours as needed for Pain. 30 tablet 0    levothyroxine (SYNTHROID) 75 MCG tablet Take 1 tablet (75 mcg total) by mouth every morning before breakfast. 90 tablet 0    lidocaine (LIDODERM) 5 % Place 1 patch onto the skin daily. Apply patch for 12 hours and then remove patch and leave off for 12 hours. 30 patch 0    nystatin-triamcinolone (MYCOLOG) ointment Apply topically 4 times a day. 30 g 0    omeprazole (PRILOSEC) 40 MG capsule Take 1 capsule (40 mg total) by mouth every  morning before breakfast. 90 capsule 3    polyethylene glycol (GOLYTELY) 236-22.74-6.74 -5.86 gram solution TAKE AS DIRECTED FOR COLONOSCOPY 4000 mL 0    polyethylene glycol (GOLYTELY) 236-22.74-6.74 -5.86 gram solution Please take your prep according to the instructions outlined in your prep letter in messages and communications. 4000 mL 0    polyethylene glycol (MIRALAX) 17 gram/dose powder Take 1 capful daily. Indications: constipation 527 g 11    sodium chloride (SALINE MIST) 0.65 % nasal spray Use 1 spray into each nostril if needed for Congestion. 15 mL 0    nitrofurantoin, macrocrystal-monohydrate, (MACROBID) 100 MG capsule Take 1 capsule (100 mg total) by mouth 2 times a day for 5 days. 10 capsule 0     No current facility-administered medications for this visit.      Review of Systems   Constitutional:  Positive for fatigue.   Genitourinary:  Positive for dysuria.   Neurological:         Short-term memory loss   Psychiatric/Behavioral:  Positive for depression.            Objective   Vitals:    08/13/23 0935   BP: 126/74   Pulse: 60   Temp:  97 F (36.1 C)   SpO2: 99%   Weight: 144 lb 6.4 oz (65.5 kg)     Body mass index is 27.28 kg/m.        Physical Exam  Vitals reviewed.   Constitutional:       General: She is not in acute distress.     Appearance: Normal appearance. She is not ill-appearing.   HENT:      Head: Normocephalic.   Cardiovascular:      Rate and Rhythm: Normal rate and regular rhythm.   Pulmonary:      Breath sounds: Normal breath sounds.   Skin:     General: Skin is warm and dry.   Neurological:      General: No focal deficit present.      Mental Status: She is alert and oriented to person, place, and time.   Psychiatric:         Mood and Affect: Affect is flat.         Behavior: Behavior is withdrawn.         Cognition and Memory: Memory is impaired.       Lab Results   Component Value Date    KETONESU negative 08/13/2023    GLUCOSEU negative 08/13/2023    BLOODU + 08/13/2023     BILIRUBINUR 1+ 08/13/2023    UROBILINOGEN negative 08/13/2023    PROTEINUA 15 08/13/2023    NITRITE negative 08/13/2023    LEUKOCYTESUR negative 08/13/2023    PHUR 5.5 08/13/2023    LABSPEC 1.030 08/13/2023    CLARITYU Cloudy 08/13/2023    COLORU Yellow 08/13/2023                Assessment & Plan     Debra Davidson was seen today for hypothyroidism.    Diagnoses and all orders for this visit:    Acquired hypothyroidism (Primary)    Dysuria  -     POCT Urinalysis Dipstick, nonautomated; w/o micro  -     nitrofurantoin, macrocrystal-monohydrate, (MACROBID) 100 MG capsule; Take 1 capsule (100 mg total) by mouth 2 times a day for 5 days.    Moderate episode of recurrent major depressive disorder (CMS-HCC)    Memory loss        Will treat for possible UTI based on dysuria. Starting macrobid.     Advised MRI was abnormal and had some non-specific changes that I was uncertain the cause of, which may be explained further after she meets with Neurology to discuss her memory loss.     Reinforced what was discussed with psychiatry- advised they felt more information may be gained from neurocognitive evaluation next month and plan was decided to continue the lexapro for depression.     Thyroid cascade drawn to monitor hypothyroidism- has not been well controlled. Advised would likely need to increase dose again. Reinforced that she must take this every morning before eating or taking other medications for best absorption.          Return in about 3 months (around 11/13/2023) for hypothyroidism.         Kaleen Mask, CNP    Number and Complexity of Problems Addressed  1 acute, uncomplicated illness or injury  1 or more chronic illness with exacerbation, progression, or side effects of treatment  1 undiagnosed new problem with uncertain prognosis    Amount and/or Complexity of Data to be Reviewed and Analyzed  1 unique test ordered  Assessment requiring an independent historian that is not  the patient    Risk of Complications and/or  Morbidity or Mortality of Patient Management  Moderate    Time  I spent a total of 32 minutes on the day of the visit.

## 2023-08-14 MED ORDER — levothyroxine (SYNTHROID) 75 MCG tablet
75 | ORAL_TABLET | Freq: Every morning | ORAL | 1 refills | 30.00000 days | Status: AC
Start: 2023-08-14 — End: 2023-10-10

## 2023-08-14 NOTE — Telephone Encounter (Signed)
**  Will need Nepali interpreter, son reports prefers phone call for results and OK to leave results on VM**    Please advise patient that Debra Davidson's thyroid levels are now normal. We will continue her current dose of Levothyroxine, every morning, 1 hour before she eats and takes her other medicines. We will plan to repeat her blood work in 6 months to keep an eye on thyroid function.     I did send a prescription for an antibiotic called Macrobid to treat a possible urinary tract infection causing the burning with urination. She will take this twice daily for 5 days. Drink lots of water, limit pop, tea, caffeine. Follow up if no improvement in symptoms after completing the antibiotic.

## 2023-08-14 NOTE — Telephone Encounter (Signed)
 LVM for patient to return call.

## 2023-08-19 NOTE — Telephone Encounter (Signed)
 MyChart message sent to patient to call office for blood work results.

## 2023-09-03 ENCOUNTER — Ambulatory Visit: Admit: 2023-09-03 | Discharge: 2023-09-03 | Payer: PRIVATE HEALTH INSURANCE | Attending: Neurology

## 2023-09-03 ENCOUNTER — Other Ambulatory Visit: Admit: 2023-09-03 | Payer: PRIVATE HEALTH INSURANCE

## 2023-09-03 DIAGNOSIS — E559 Vitamin D deficiency, unspecified: Secondary | ICD-10-CM

## 2023-09-03 DIAGNOSIS — R413 Other amnesia: Secondary | ICD-10-CM

## 2023-09-03 DIAGNOSIS — F419 Anxiety disorder, unspecified: Secondary | ICD-10-CM

## 2023-09-03 LAB — VITAMIN B1, WHOLE BLOOD: Vit. B1, Whole Blood: 93.1 nmol/L (ref 66.5–200.0)

## 2023-09-03 LAB — VITAMIN D 25 HYDROXY: Vit D, 25-Hydroxy: 11 ng/mL — ABNORMAL LOW (ref 30.0–100.0)

## 2023-09-03 NOTE — Progress Notes (Addendum)
 Patient: Debra Davidson  Date of Birth: 1976-03-31  MRN: [id number]           General Neurology Consultation Note    Chief Complaint   Patient presents with    New Patient Visit/ Consultation    Memory Loss     Patient is accompanied by her oldest son.  Nepali interpreter, Rijan [phone] was present throughout the visit.    Subjective   History of Present Illness  Debra Davidson is a 48 year old female who presents with nausea and cognitive changes. She is accompanied by her son. She was referred by her family doctor for evaluation of her depression and anxiety and possible neurological condition.    She has been experiencing symptoms of depression and anxiety for the past one to two years, including persistent low mood, lack of response to questions, forgetfulness, agitation, and restlessness. Her son reports difficulty in remembering past events during therapy sessions and lack of response to questions or prompts from psychiatrists. She has been on Lexapro, but there has been no significant improvement in her condition.    A Brain MRI performed on July 08, 2023, showed minimal white matter signal hyperintensities and three punctate areas of susceptibility artifact in the cerebral hemispheres, with no acute findings. The reason for the neurology referral was unclear to her or her son.    She experiences nausea, which may be related to long travel and motion sickness. Her son expresses concern about this symptom.    She has a history of low vitamin D levels, with a measurement of 8.0 ng/mL in March 2024. She is currently taking vitamin D supplements as prescribed.    Her educational background includes enrollment in kindergarten, but she was unable to continue due to learning difficulties. Her son expresses confusion and frustration with the lack of a clear diagnosis or effective treatment plan for her mental health issues.    STOP-BANG Score for OSA:  High risk: 5-8   Intermediate risk: 3-4    Low risk 0-2        09/03/2023     9:29 AM   Stop Bang Questionnaire   Does the patient snore loudly (louder than talking or loud enough to be heard through closed doors)? 0    Do you often feel tired, fatigued, or sleepy during daytime? 1    Has anyone observed you stop breathing during your sleep? 0    Do you have or are you being treated for high blood pressure? 0    Is your BMI more than 35 kg/m2? 1    Age over 42 yr old? 0    Neck circumference greater than 16in/40 cm? 0    Gender female? 0    Total Score 2        Proxy-reported         Assessment & Plan    ICD-10-CM    1. Vitamin D deficiency  E55.9 Vitamin D 25 hydroxy     Vitamin B1, whole blood     Neurology Nutrition     Neurocognitive testing referral      2. Anxiety and depression  F41.9 Vitamin D 25 hydroxy    F32.A Vitamin B1, whole blood     Neurology Nutrition     Neurocognitive testing referral      3. Cognitive changes  R41.89 Vitamin D 25 hydroxy     Vitamin B1, whole blood     Neurology Nutrition     Neurocognitive testing referral  4. Acquired hypothyroidism  E03.9 Neurology Nutrition        Depression and anxiety    She has experienced depression and anxiety for 1-2 years, with symptoms including mood changes, memory difficulties, agitation, and restlessness. She has known history of hypothyroidism. An MRI showed minimal white matter signal hyperintensities and microhemorrhages, but no acute findings, unrelated to her psychiatric symptoms. Lexapro has not significantly improved her symptoms. A psychiatric evaluation was incomplete due to her inability to respond to questions. Further psychiatric evaluation and management are necessary. Follow up with psychiatry for further evaluation and management. Consider neurocognitive testing through psychiatry. Ensure follow-up with psychiatrist Dr. Lauraine Rinne.    Vitamin D deficiency    Her vitamin D level was critically low at 8.0 ng/mL in March of last year. She is not on vitamin D supplementation, which is essential  for her health and may contribute to her depressive symptoms. Initiate vitamin D supplementation and recheck vitamin D levels. Refer to a nutritionist for dietary guidance on vitamin D intake. Inform the family doctor about the low vitamin D levels.    Motion sickness    She experiences nausea, likely related to motion sickness during long car rides, contributing to her discomfort. Consider strategies to manage motion sickness, such as minimizing travel or using antiemetic medications if necessary.    Recommended to follow up with PCP for general medical issues.    Patient verbalized understanding of the plan and encouraged to call my office at [phone] for further questions and concerns.    Return if symptoms worsen or fail to improve.         Other Indicators of Depression/Anxiety Severity:  PHQ-9 Scores:       08/21/2022    11:08 AM 08/21/2022    11:54 AM 06/19/2023    11:30 AM   PHQ Total Score   PHQ-9 Total Score 20 0 14     GAD7 Scores:       09/03/2023     9:12 AM   GAD7Total Score   GAD-7 Total Score 17        Proxy-reported     Histories:     She has a past medical history of Anxiety, Depression, History of stomach ulcers, Hypothyroid, and Migraine.    She has a past surgical history that includes Esophagogastroduodenoscopy (N/A, 11/14/2022) and Colonoscopy (N/A, 11/14/2022).    Her family history includes Depression in her sister; Heart attack in her father.    She reports that she has never smoked. She has never used smokeless tobacco. She reports that she does not drink alcohol and does not use drugs.   Marital status: Widowed.    Employed: Engineer, technical sales.    Allergies:   Patient has no known drug allergies or adverse reactions.    Medications:     Outpatient Medications Prior to Visit   Medication Sig Dispense Refill    acetaminophen (TYLENOL) 500 MG tablet Take 1 tablet (500 mg total) by mouth every 6 hours as needed.      cetirizine (ZYRTEC) 10 MG tablet Take 1 tablet (10 mg total) by mouth daily. 30  tablet 11    escitalopram oxalate (LEXAPRO) 20 MG tablet Take 1 tablet (20 mg total) by mouth daily. 90 tablet 3    fluticasone propionate (FLONASE) 50 mcg/actuation nasal spray Use 1 spray into each nostril daily. 16 g 0    ibuprofen (MOTRIN) 800 MG tablet Take 1 tablet (800 mg total) by mouth every 8 hours  as needed for Pain. 30 tablet 0    levothyroxine (SYNTHROID) 75 MCG tablet Take 1 tablet (75 mcg total) by mouth every morning before breakfast. 90 tablet 1    lidocaine (LIDODERM) 5 % Place 1 patch onto the skin daily. Apply patch for 12 hours and then remove patch and leave off for 12 hours. 30 patch 0    nystatin-triamcinolone (MYCOLOG) ointment Apply topically 4 times a day. 30 g 0    omeprazole (PRILOSEC) 40 MG capsule Take 1 capsule (40 mg total) by mouth every morning before breakfast. 90 capsule 3    polyethylene glycol (GOLYTELY) 236-22.74-6.74 -5.86 gram solution TAKE AS DIRECTED FOR COLONOSCOPY 4000 mL 0    polyethylene glycol (GOLYTELY) 236-22.74-6.74 -5.86 gram solution Please take your prep according to the instructions outlined in your prep letter in messages and communications. 4000 mL 0    polyethylene glycol (MIRALAX) 17 gram/dose powder Take 1 capful daily. Indications: constipation 527 g 11    sodium chloride (SALINE MIST) 0.65 % nasal spray Use 1 spray into each nostril if needed for Congestion. 15 mL 0     No facility-administered medications prior to visit.        Review of Systems  Answers submitted by the patient for this visit:  Review of Systems (Submitted on 09/03/2023)  Fever: No  Unexpected Weight Change: No  Hearing Loss: No  Visual Disturbance: Yes  Chest Pain: Yes  Leg Swelling: Yes  Arthralgias (Joint pain): Yes  Back pain: Yes  Weakness: Yes    The following portions of the patient's history were reviewed and updated as appropriate:  allergies, current medications, past family history, past medical history, past social history, past surgical history, problem list and review of  systems.    Objective    BP 137/87 (BP Location: Left upper arm, Patient Position: Sitting, BP Cuff Size: Regular)   Pulse 59   Ht 5' (1.524 m)   Wt 144 lb (65.3 kg)   SpO2 99%   BMI 28.12 kg/m   Wt Readings from Last 3 Encounters:   09/03/23 144 lb (65.3 kg)   08/13/23 144 lb 6.4 oz (65.5 kg)   07/31/23 143 lb (64.9 kg)     Physical Exam      Neurological Exam  Mental Status  Alert. Memory is normal. Recent and remote memory are intact. Speech is normal. no dysarthria present. Language is fluent with no aphasia. Attention and concentration are normal. Fund of knowledge is appropriate for level of education. Apraxia absent.    Cranial Nerves  CN II: Right normal visual field. Left normal visual field.  CN III, IV, VI: Extraocular movements intact bilaterally. No nystagmus.   Right pupil: Reactive to light.   Left pupil: Reactive to light.  Relative afferent pupillary defect absent.  CN V:  Right: Facial sensation is normal.  Left: Facial sensation is normal on the left.  CN VII:  Right: There is no facial weakness.  Left: There is no facial weakness.  CN VIII:  Right: Hearing is normal.  Left: Hearing is normal.  CN IX, X:  Right: Palate is normal.  Left: Palate is normal.  CN XI:  Right: Sternocleidomastoid strength is normal. Trapezius strength is normal.  Left: Sternocleidomastoid strength is normal. Trapezius strength is normal.  CN XII: Tongue midline without atrophy or fasciculations.    Motor  Normal muscle bulk throughout. No fasciculations present. Normal muscle tone. Strength is 5/5 throughout all four extremities.  Sensory  Light touch is normal in upper and lower extremities. Temperature is normal in upper and lower extremities. Proprioception is normal in upper and lower extremities.     Reflexes                                            Right                      Left  Brachioradialis                    2+                         2+  Biceps                                 2+                          2+  Triceps                                2+                         2+  Patellar                                2+                         2+  Achilles                                2+                         2+    Coordination  Right: Finger-to-nose normal.Left: Finger-to-nose normal.    Gait  Casual gait is normal including stance, stride, and arm swing.       Physical Exam  HENT:      Right Ear: Hearing normal.      Left Ear: Hearing normal.   Eyes:      Extraocular Movements: EOM normal. No nystagmus.   Neurological:      Mental Status: She is alert.      Cranial Nerves: No dysarthria.      Motor: Motor strength is normal.     Deep Tendon Reflexes:      Reflex Scores:       Tricep reflexes are 2+ on the right side and 2+ on the left side.       Bicep reflexes are 2+ on the right side and 2+ on the left side.       Brachioradialis reflexes are 2+ on the right side and 2+ on the left side.       Patellar reflexes are 2+ on the right side and 2+ on the left side.       Achilles reflexes are 2+ on the right side and 2+ on the left side.  Psychiatric:  Attention and Perception: Attention normal.         Mood and Affect: Mood and affect normal.         Speech: Speech normal.         Behavior: Behavior normal. Behavior is cooperative.         Thought Content: Thought content normal.         Cognition and Memory: Cognition and memory normal.         Judgment: Judgment normal.       Results  LABS  Vitamin D: 8.0 (07/2022)    RADIOLOGY  MRI brain without contrast:   Minimal white matter signal hyperintensities, nonspecific, may represent small vessel disease or sequelae from other insults. No acute infarcts or mass effect. Three punctate areas of susceptibility artifact in the cerebral hemispheres, likely representing microhemorrhages. (07/08/2023)      Labs Reviewed:   TSH   Date Value Ref Range Status   08/13/2023 3.64 0.45 - 4.12 uIU/mL Final     Free T4   Date Value Ref Range Status   06/11/2023 0.68  0.61 - 1.76 ng/dL Final     Comment:     Biotin megadosing (consumption >300 mcg/day) may falsely elevate free T4. When indicated, discontinue megadosing for 1 week and repeat testing.     Vitamin B-12   Date Value Ref Range Status   06/11/2023 422 180 - 914 pg/mL Final     Vit D, 25-Hydroxy (ng/mL)   Date Value   08/21/2022 8.0 (L)     Lab Results   Component Value Date    WBC 7.3 02/17/2023    HGB 13.1 02/17/2023    HCT 38.8 02/17/2023    MCV 87.3 02/17/2023    PLT 191 02/17/2023     Lab Results   Component Value Date    GLUCOSE 89 02/17/2023    BUN 14 02/17/2023    CO2 25 02/17/2023    CREATININE 0.61 02/17/2023    K 4.5 02/17/2023    NA 138 02/17/2023    CL 102 02/17/2023    CALCIUM 8.9 02/17/2023       Medical Decision Making:  The following items were considered in medical decision making:  Obtain records and history from outside facility/provider  Review / order radiology tests  Review / order other diagnostic tests/interventions  Reviewed outside records    I spent 32 minutes speaking with the patient, conducting an interview, performing a limited exam, and educating the patient on my assessment and plan. I also spent 20 minutes, on the same day as the encounter, preparing to see the patient (eg, review of tests), obtaining and/or reviewing separately obtained history, ordering medications, tests, or procedures, referring and communicating with other health care professionals , documenting clinical information in the electronic or other health record, independently interpreting results and communicating results to the patient/family/caregiver, providing care coordination , and performing non-face-to-face activities.     Electronically signed by:  Olene Craven, MD  09/03/2023 11:43 AM

## 2023-09-04 NOTE — Telephone Encounter (Signed)
 Mychart message sent to schedule MNT per referral

## 2023-09-18 NOTE — Telephone Encounter (Signed)
 2nd Mychart message sent to schedule MNT per referral

## 2023-09-29 ENCOUNTER — Ambulatory Visit: Payer: PRIVATE HEALTH INSURANCE

## 2023-10-10 MED ORDER — levothyroxine (SYNTHROID) 75 MCG tablet
75 | ORAL_TABLET | Freq: Every morning | ORAL | 1 refills | 30.00000 days | Status: AC
Start: 2023-10-10 — End: ?

## 2023-10-10 NOTE — Telephone Encounter (Signed)
 Last Refilled: 08/14/2023  Last visit with Provider: 08/13/2023 Renne Casa, CNP  Next appointment in this department: Visit date not found      Last Office Visit: 08/13/2023

## 2023-10-10 NOTE — Telephone Encounter (Signed)
 Letter sent.

## 2023-10-17 NOTE — Telephone Encounter (Signed)
 Voice message left for patient (@ cell phone number in chart); attempting to schedule an appointment with the neurology dept, memory/cognition; main scheduling number left for contact purposes

## 2023-10-17 NOTE — Telephone Encounter (Signed)
 LVM for patient to call back in regards to a records release we have received.

## 2023-10-19 NOTE — Other (Signed)
 This is a notification of a Discharge Alert generated from an ADT received from [employer]. This patient was admited to:[facility] Admit [phone] Discharge Date:[id number] Visit Type:Emergency Diagnosis:Headache, unspecified

## 2023-10-19 NOTE — Other (Signed)
 This is a notification of a Admission Alert generated from an ADT received from [employer]. This patient was admited to:[facility] Admit [phone] Discharge Date: Visit Type:Emergency Diagnosis:

## 2023-10-22 NOTE — Telephone Encounter (Signed)
 Per referral for memory/cognition appointment several attempts to reach patient by phone have been unsuccessful, calls go to voice mail, voice messages have been left and my chart message has also been sent

## 2023-10-28 ENCOUNTER — Ambulatory Visit: Payer: Medicaid (Managed Care)

## 2023-11-03 NOTE — Progress Notes (Signed)
 Vm-I left ([id number]), patient called to confirm procedure on 11/14/23, message included arrival time, need for escort and direct line for return call

## 2023-11-07 MED ORDER — omeprazole (PRILOSEC) 40 MG capsule
40 | ORAL_CAPSULE | ORAL | 3 refills | 30.00000 days | Status: AC
Start: 2023-11-07 — End: ?

## 2023-11-07 MED ORDER — cetirizine (ZYRTEC) 10 MG tablet
10 | ORAL_TABLET | Freq: Every day | ORAL | 3 refills | 30.00000 days | Status: AC
Start: 2023-11-07 — End: ?

## 2023-11-07 MED ORDER — escitalopram oxalate (LEXAPRO) 20 MG tablet
20 | ORAL_TABLET | Freq: Every day | ORAL | 3 refills | 30.00000 days | Status: AC
Start: 2023-11-07 — End: ?

## 2023-11-07 NOTE — Telephone Encounter (Signed)
 Last Refilled: 06/11/23  Last visit with Provider: 08/13/2023 Renne Casa, CNP  Next appointment in this department: Visit date not found      Last Office Visit: 08/13/2023

## 2023-11-12 NOTE — Progress Notes (Signed)
 Second voice-mail left for Pt trying to confirm colo appointment on 6/20 with arrival time of 0945. Left details to follow the clear liquid diet and prep instructions from MD, to have Pt arrange a ride for after procedure with friend or family member, and call back number of [phone] for any questions about appointment.    Interpreter request sent 6/9

## 2023-11-14 ENCOUNTER — Ambulatory Visit: Payer: Medicaid (Managed Care)

## 2023-11-14 ENCOUNTER — Encounter

## 2024-01-21 NOTE — Telephone Encounter (Signed)
 Spoke to patient's daughter in law and son to schedule a cognitive appointment with Ladean Ades. After review it was decided patient needed to be seen by general neurology and not undergo cognitive testing. Per Clarita Ellen, cognitive appointment cancelled and patient offered a gen neuro appointment with Dr. Gaile on 9/10 at 11 am or 3 pm. My chart message and voice message left for patient to contact and schedule at a convenient time.

## 2024-01-29 NOTE — Progress Notes (Signed)
 Faxed push imaging request to [facility] 10/19/23 for Dr. Hurman Gallus to review.

## 2024-02-04 ENCOUNTER — Ambulatory Visit: Payer: Medicaid (Managed Care)

## 2024-02-24 ENCOUNTER — Ambulatory Visit: Payer: Medicaid (Managed Care)

## 2024-04-14 MED ORDER — LEVOTHYROXINE 75 MCG TABLET
75 | ORAL_TABLET | Freq: Every morning | ORAL | 1 refills | 90.00000 days | Status: AC
Start: 2024-04-14 — End: ?

## 2024-04-14 NOTE — Telephone Encounter (Signed)
 Last Refilled: 10/10/2023  Last visit with Provider: Visit date not found  Next appointment in this department: Visit date not found      Last Office Visit: 11/07/2023
